# Patient Record
Sex: Female | Born: 1965 | Race: White | Hispanic: No | Marital: Married | Smoking: Former smoker
Health system: Southern US, Community
[De-identification: ages and names within clinical notes are randomized; demographics above are authoritative.]

## PROBLEM LIST (undated history)

## (undated) DIAGNOSIS — M359 Systemic involvement of connective tissue, unspecified: Secondary | ICD-10-CM

## (undated) DIAGNOSIS — T7840XA Allergy, unspecified, initial encounter: Secondary | ICD-10-CM

## (undated) DIAGNOSIS — E079 Disorder of thyroid, unspecified: Secondary | ICD-10-CM

## (undated) DIAGNOSIS — IMO0002 Reserved for concepts with insufficient information to code with codable children: Secondary | ICD-10-CM

## (undated) DIAGNOSIS — M329 Systemic lupus erythematosus, unspecified: Secondary | ICD-10-CM

## (undated) DIAGNOSIS — G4730 Sleep apnea, unspecified: Secondary | ICD-10-CM

## (undated) DIAGNOSIS — K219 Gastro-esophageal reflux disease without esophagitis: Secondary | ICD-10-CM

## (undated) DIAGNOSIS — F419 Anxiety disorder, unspecified: Secondary | ICD-10-CM

## (undated) DIAGNOSIS — M1990 Unspecified osteoarthritis, unspecified site: Secondary | ICD-10-CM

## (undated) HISTORY — DX: Disorder of thyroid, unspecified: E07.9

## (undated) HISTORY — PX: ABDOMINAL HYSTERECTOMY: SHX81

## (undated) HISTORY — DX: Anxiety disorder, unspecified: F41.9

## (undated) HISTORY — DX: Gastro-esophageal reflux disease without esophagitis: K21.9

## (undated) HISTORY — DX: Sleep apnea, unspecified: G47.30

## (undated) HISTORY — DX: Unspecified osteoarthritis, unspecified site: M19.90

## (undated) HISTORY — DX: Allergy, unspecified, initial encounter: T78.40XA

## (undated) HISTORY — PX: CHOLECYSTECTOMY: SHX55

## (undated) HISTORY — PX: COSMETIC SURGERY: SHX468

## (undated) HISTORY — PX: THYROIDECTOMY: SHX17

---

## 2018-07-18 ENCOUNTER — Encounter: Payer: Self-pay | Admitting: Emergency Medicine

## 2018-07-18 ENCOUNTER — Emergency Department
Admission: EM | Admit: 2018-07-18 | Discharge: 2018-07-18 | Disposition: A | Discharge status: Home / Self Care | Payer: Managed Care, Other (non HMO) | Attending: Emergency Medicine | Admitting: Emergency Medicine

## 2018-07-18 ENCOUNTER — Emergency Department: Payer: Managed Care, Other (non HMO)

## 2018-07-18 ENCOUNTER — Other Ambulatory Visit: Payer: Self-pay

## 2018-07-18 DIAGNOSIS — R1032 Left lower quadrant pain: Secondary | ICD-10-CM | POA: Diagnosis not present

## 2018-07-18 DIAGNOSIS — K5792 Diverticulitis of intestine, part unspecified, without perforation or abscess without bleeding: Secondary | ICD-10-CM

## 2018-07-18 HISTORY — DX: Reserved for concepts with insufficient information to code with codable children: IMO0002

## 2018-07-18 HISTORY — DX: Systemic involvement of connective tissue, unspecified: M35.9

## 2018-07-18 HISTORY — DX: Systemic lupus erythematosus, unspecified: M32.9

## 2018-07-18 LAB — URINALYSIS, COMPLETE (UACMP) WITH MICROSCOPIC
Bilirubin Urine: NEGATIVE
GLUCOSE, UA: NEGATIVE mg/dL
HGB URINE DIPSTICK: NEGATIVE
KETONES UR: NEGATIVE mg/dL
LEUKOCYTES UA: NEGATIVE
Nitrite: NEGATIVE
PH: 6 (ref 5.0–8.0)
PROTEIN: NEGATIVE mg/dL
Specific Gravity, Urine: 1.008 (ref 1.005–1.030)

## 2018-07-18 LAB — BASIC METABOLIC PANEL
Anion gap: 7 (ref 5–15)
BUN: 12 mg/dL (ref 6–20)
CALCIUM: 8.7 mg/dL — AB (ref 8.9–10.3)
CO2: 23 mmol/L (ref 22–32)
CREATININE: 0.51 mg/dL (ref 0.44–1.00)
Chloride: 109 mmol/L (ref 98–111)
GFR calc Af Amer: >60 mL/min (ref >60)
GLUCOSE: 111 mg/dL — AB (ref 70–99)
Potassium: 4.3 mmol/L (ref 3.5–5.1)
SODIUM: 139 mmol/L (ref 135–145)

## 2018-07-18 LAB — CBC WITH DIFFERENTIAL/PLATELET
Basophils Absolute: 0.1 10*3/uL (ref 0–0.1)
Basophils Relative: 1 %
EOS ABS: 0.2 10*3/uL (ref 0–0.7)
Eosinophils Relative: 2 %
HCT: 39.2 % (ref 35.0–47.0)
Hemoglobin: 13.6 g/dL (ref 12.0–16.0)
LYMPHS ABS: 1.4 10*3/uL (ref 1.0–3.6)
LYMPHS PCT: 16 %
MCH: 28.2 pg (ref 26.0–34.0)
MCHC: 34.7 g/dL (ref 32.0–36.0)
MCV: 81.3 fL (ref 80.0–100.0)
MONO ABS: 0.5 10*3/uL (ref 0.2–0.9)
Monocytes Relative: 6 %
Neutro Abs: 6.4 10*3/uL (ref 1.4–6.5)
Neutrophils Relative %: 75 %
PLATELETS: 292 10*3/uL (ref 150–440)
RBC: 4.82 MIL/uL (ref 3.80–5.20)
RDW: 14.5 % (ref 11.5–14.5)
WBC: 8.5 10*3/uL (ref 3.6–11.0)

## 2018-07-18 IMAGING — CT CT ABD-PELV W/ CM
2 of 5 series · 16 of 46 positions shown, 18 images · IV contrast (APPLIED)
Comparison: None.

CLINICAL DATA: Left lower abdominal pain

EXAM:
CT ABDOMEN AND PELVIS WITH CONTRAST
TECHNIQUE: Multidetector CT imaging of the abdomen and pelvis was performed
using the standard protocol following bolus administration of
intravenous contrast.
CONTRAST:  100mL [E5] IOPAMIDOL ([E5]) INJECTION 61%

[Series 2: routine abd/pel with · axial · 0.90mm/px · z∈[-997,-537]mm · 13 of 104 slices shown, 15 images]
[im 6/104  soft-tissue]
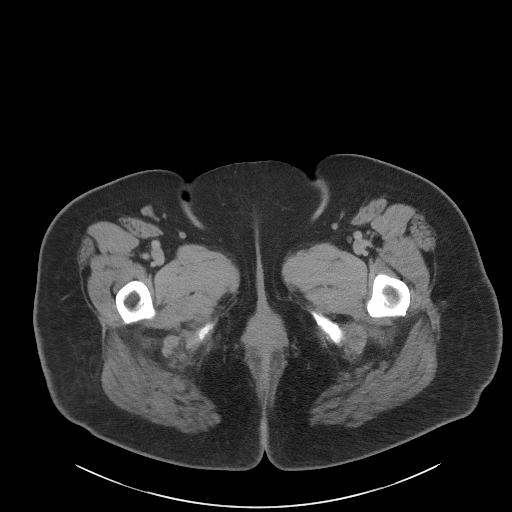
[im 6/104  bone]
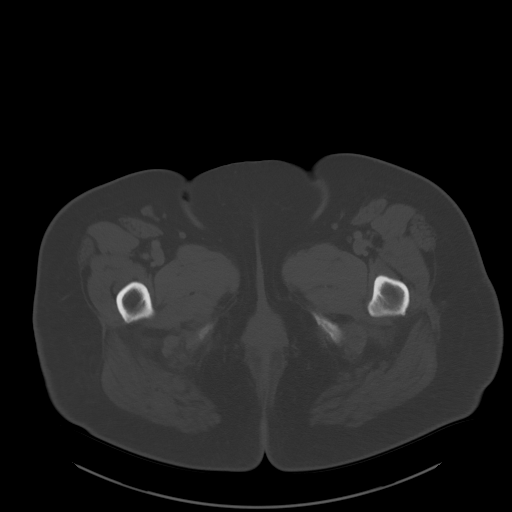
[im 12/104  soft-tissue]
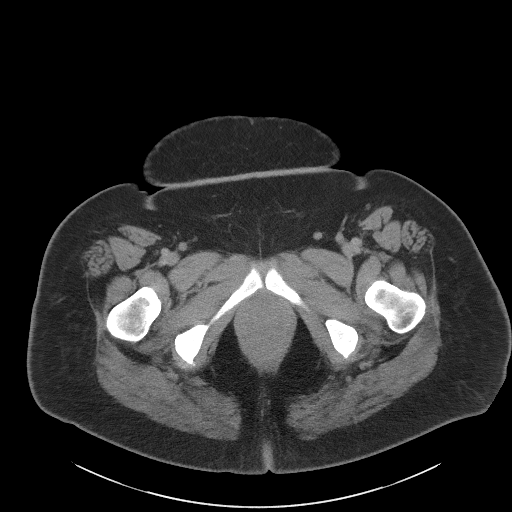
[im 23/104  soft-tissue]
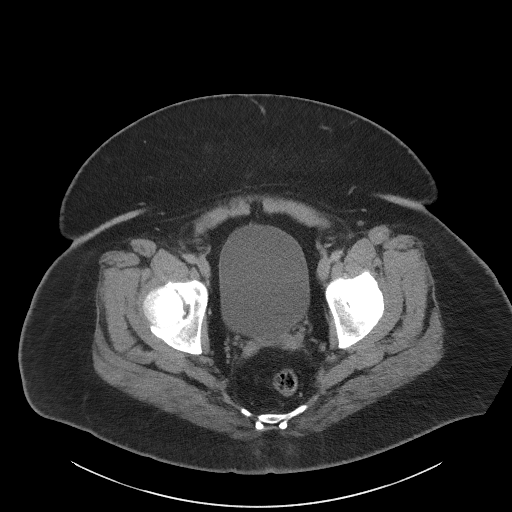
[im 29/104  soft-tissue]
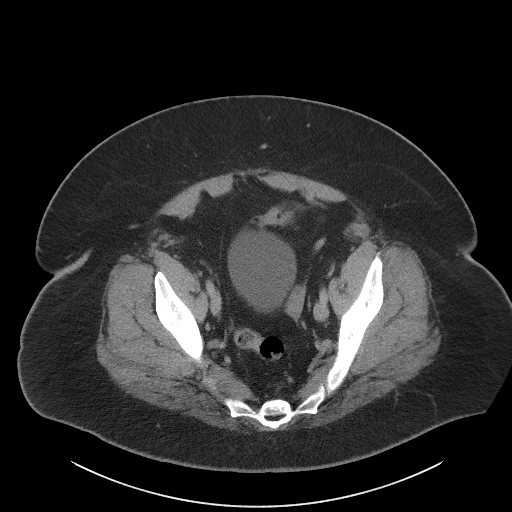
[im 35/104  soft-tissue]
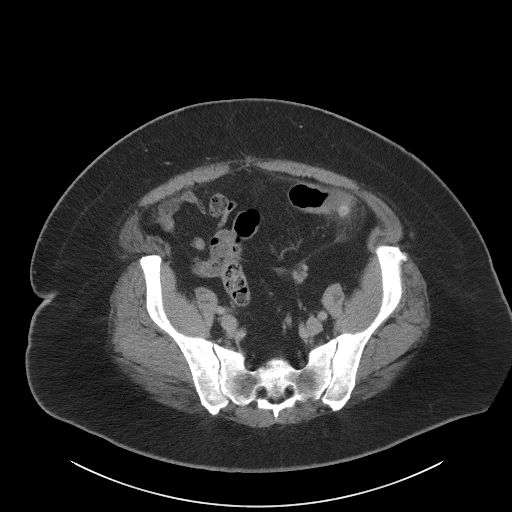
[im 46/104  soft-tissue]
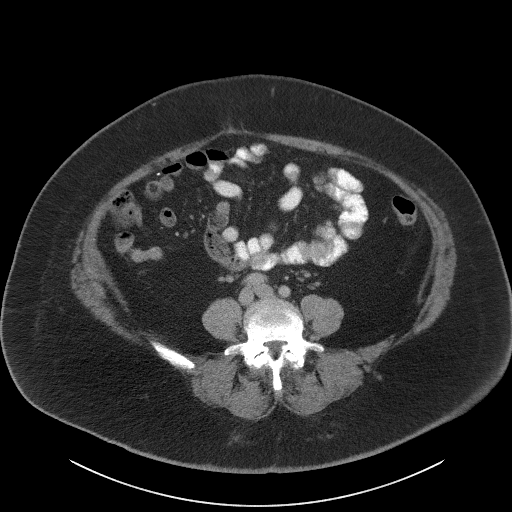
[im 52/104  soft-tissue]
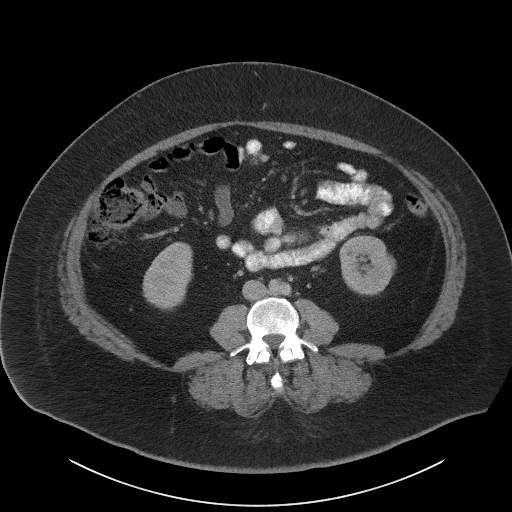
[im 58/104  soft-tissue]
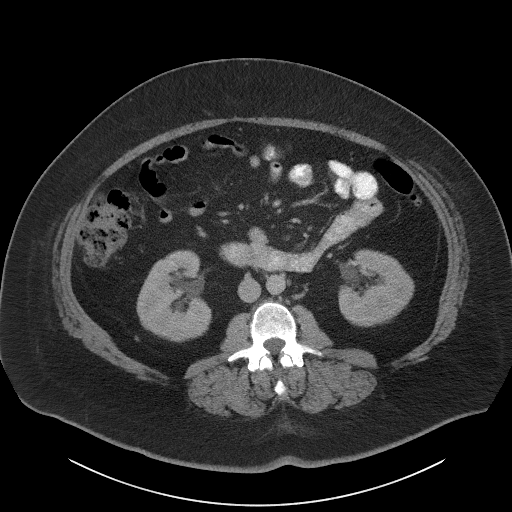
[im 69/104  soft-tissue]
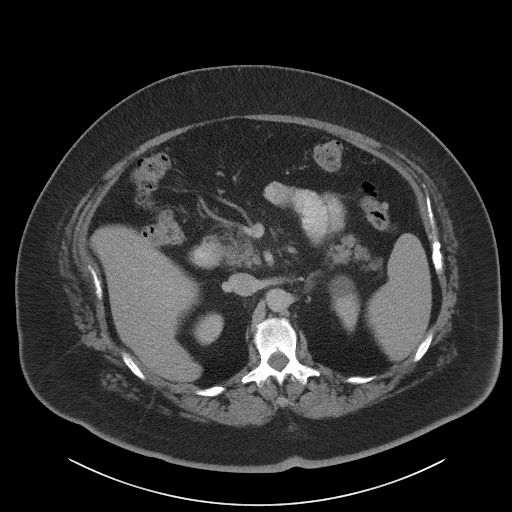
[im 69/104  bone]
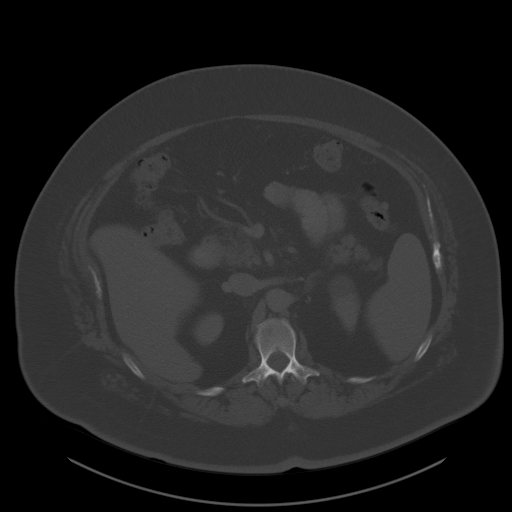
[im 75/104  soft-tissue]
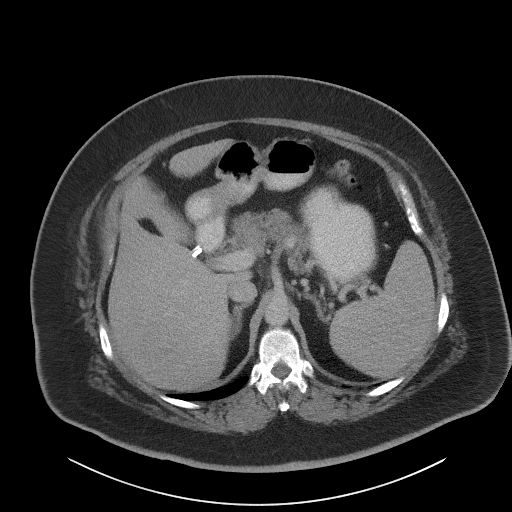
[im 81/104  soft-tissue]
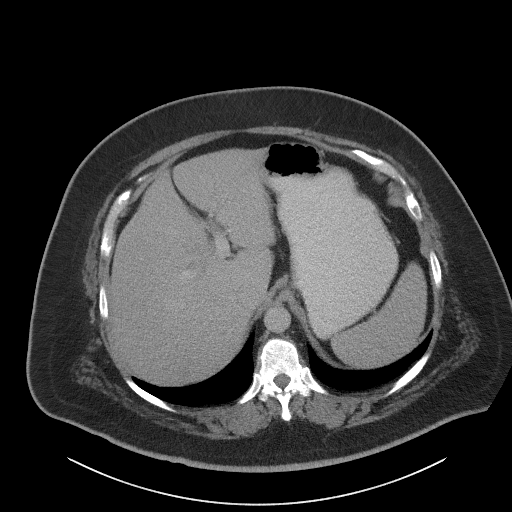
[im 92/104  soft-tissue]
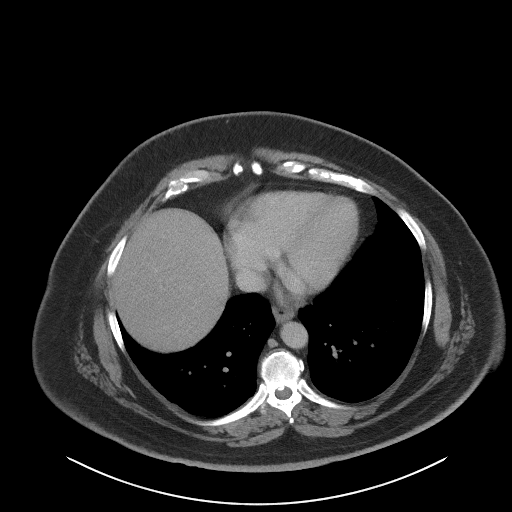
[im 98/104  soft-tissue]
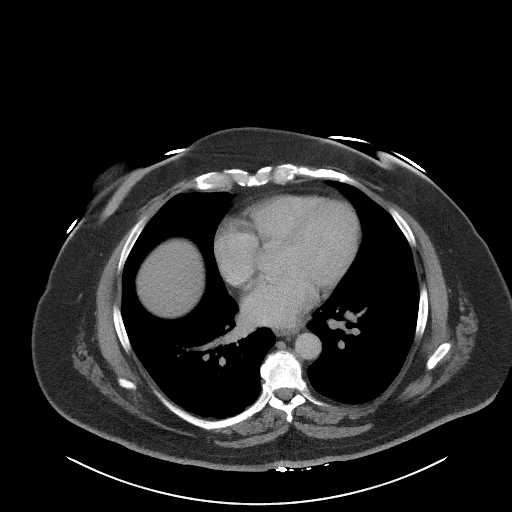

[Series 5: coronal st · coronal · 0.94mm/px · 3 of 125 slices shown]
[im 42/125  soft-tissue]
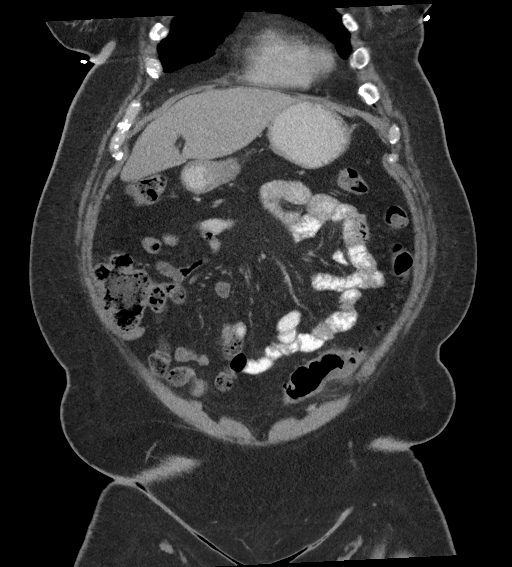
[im 56/125  soft-tissue]
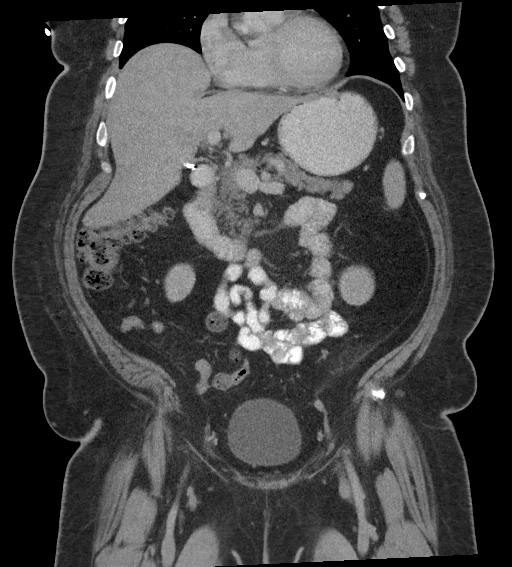
[im 69/125  soft-tissue]
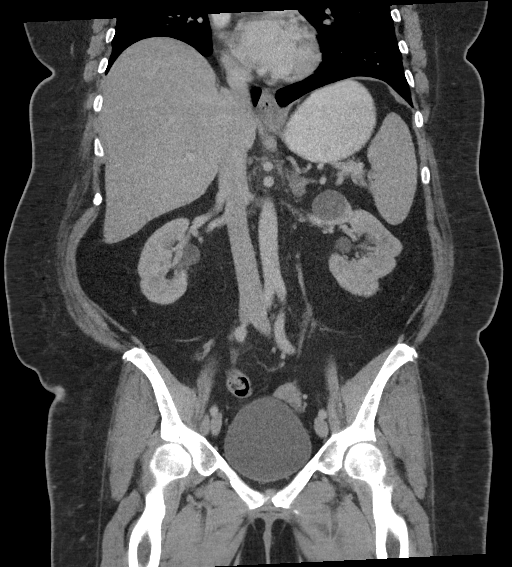

[16 of 46 positions shown; findings below may reference images not displayed]

FINDINGS: Lower chest: No acute abnormality.

Hepatobiliary: No focal liver abnormality is seen. Status post
cholecystectomy. No biliary dilatation.

Pancreas: Unremarkable. No pancreatic ductal dilatation or
surrounding inflammatory changes.

Spleen: Within normal limits.

Adrenals/Urinary Tract: Adrenal glands are within normal limits with
the exception of a small 14 mm hypodense lesion arising from the
left adrenal gland likely representing a small adenoma. Kidneys are
well visualized bilaterally without evidence of renal calculi or
urinary tract obstructive changes. Scattered cysts are noted within
the left kidney the largest of which is noted superiorly measuring
3.4 cm. The bladder is partially distended.

Stomach/Bowel: There are changes consistent with diverticulitis
involving the junction of the descending and sigmoid colon. No
perforation or abscess formation is identified. The remainder of the
colon is within normal limits. The appendix is unremarkable. No
small bowel abnormality is seen. Small sliding-type hiatal hernia is
noted.

Vascular/Lymphatic: No significant vascular findings are present. No
enlarged abdominal or pelvic lymph nodes.

Reproductive: Status post hysterectomy. No adnexal masses.

Other: No abdominal wall hernia or abnormality. No abdominopelvic
ascites.

Musculoskeletal: Mild degenerative changes of the lumbar spine are
noted.
IMPRESSION: Changes consistent with diverticulitis at the junction of the
descending and sigmoid colons without evidence of perforation or
abscess formation.

14 mm hypodense adrenal lesion. Given its size this is likely
benign. Follow-up adrenal CT can be considered in 12 months for
reassessment. This recommendation follows ACR consensus guidelines:
Management of Incidental Adrenal Masses: A White Paper of the ACR
Incidental Findings Committee. [HOSPITAL] [E5];14:[PHONE_NUMBER].

## 2018-07-18 MED ORDER — AMOXICILLIN-POT CLAVULANATE 875-125 MG PO TABS: 1.0000 | ORAL_TABLET | Freq: Two times a day (BID) | ORAL | 0 refills | Status: DC

## 2018-07-18 MED ORDER — IOPAMIDOL (ISOVUE-300) INJECTION 61%
30.0000 mL | Freq: Once | INTRAVENOUS | Status: AC | PRN
  Administered 2018-07-18: 30 mL via ORAL

## 2018-07-18 MED ORDER — ONDANSETRON 4 MG PO TBDP: 4.0000 mg | ORAL_TABLET | Freq: Three times a day (TID) | ORAL | 0 refills | Status: DC | PRN

## 2018-07-18 MED ORDER — SODIUM CHLORIDE 0.9 % IV BOLUS
1000.0000 mL | Freq: Once | INTRAVENOUS | Status: AC
  Administered 2018-07-18: 1000 mL via INTRAVENOUS

## 2018-07-18 MED ORDER — PIPERACILLIN-TAZOBACTAM 3.375 G IVPB 30 MIN
3.3750 g | Freq: Once | INTRAVENOUS | Status: AC
  Administered 2018-07-18: 3.375 g via INTRAVENOUS
  Filled 2018-07-18: qty 50

## 2018-07-18 MED ORDER — FENTANYL CITRATE (PF) 100 MCG/2ML IJ SOLN
50.0000 ug | Freq: Once | INTRAMUSCULAR | Status: AC
  Administered 2018-07-18: 50 ug via INTRAVENOUS
  Filled 2018-07-18: qty 2

## 2018-07-18 MED ORDER — IOPAMIDOL (ISOVUE-300) INJECTION 61%
100.0000 mL | Freq: Once | INTRAVENOUS | Status: AC | PRN
  Administered 2018-07-18: 100 mL via INTRAVENOUS

## 2018-07-18 MED ORDER — OXYCODONE-ACETAMINOPHEN 5-325 MG PO TABS
1.0000 | ORAL_TABLET | Freq: Once | ORAL | Status: AC
  Administered 2018-07-18: 1 via ORAL
  Filled 2018-07-18: qty 1

## 2018-07-18 MED ORDER — OXYCODONE-ACETAMINOPHEN 5-325 MG PO TABS: 1.0000 | ORAL_TABLET | ORAL | 0 refills | Status: DC | PRN

## 2018-07-18 MED ORDER — ONDANSETRON HCL 4 MG/2ML IJ SOLN
4.0000 mg | Freq: Once | INTRAMUSCULAR | Status: AC
  Administered 2018-07-18: 4 mg via INTRAVENOUS
  Filled 2018-07-18: qty 2

## 2018-07-18 NOTE — ED Triage Notes (Signed)
Patient ambulatory to triage with steady gait, without difficulty or distress noted; pt reports awoke this morning with left lower abd pain with no accomp symptoms; denies hx of same

## 2018-07-18 NOTE — ED Notes (Signed)
Patient transported to CT at this time. 

## 2018-07-18 NOTE — Discharge Instructions (Signed)
1.  Take antibiotic as prescribed (Augmentin 875 mg twice daily x7 days). 2.  You may take medicines as needed for pain and nausea (Percocet/Zofran #20). 3.  Return to the ER for worsening symptoms, persistent vomiting, fever or other concerns.

## 2018-07-18 NOTE — ED Provider Notes (Signed)
[facility] Emergency Department Provider Note   ____________________________________________   First MD Initiated Contact with Patient 07/18/18 0518     (approximate)  I have reviewed the triage vital signs and the nursing notes.   HISTORY  Chief Complaint Abdominal Pain    HPI Ruth Woods is a 52 y.o. female who presents to the ED from home with a chief complaint of abdominal pain.  Patient went to bed in her usual state of good health and awoke with left lower quadrant pain.  Describes pain as aching and nonradiating.  Not associated with fever, chills, chest pain, shortness of breath, nausea, vomiting, dysuria.  Last bowel movement yesterday which was normal for her.  Colonoscopy last year which demonstrated diverticulosis.  History of hysterectomy with one ovary removed (patient does not recall which one).  No personal history of kidney stones.  Denies recent trauma or travel.   Past Medical History:  Diagnosis Date  . Collagen vascular disease (HCC)   . Lupus (HCC)     There are no active problems to display for this patient.   Past Surgical History:  Procedure Laterality Date  . ABDOMINAL HYSTERECTOMY    . CHOLECYSTECTOMY      Prior to Admission medications   Medication Sig Start Date End Date Taking? Authorizing Provider  amoxicillin-clavulanate (AUGMENTIN) 875-125 MG tablet Take 1 tablet by mouth 2 (two) times daily. 07/18/18   Irean Hong, MD  ondansetron (ZOFRAN ODT) 4 MG disintegrating tablet Take 1 tablet (4 mg total) by mouth every 8 (eight) hours as needed for nausea or vomiting. 07/18/18   Irean Hong, MD  oxyCODONE-acetaminophen (PERCOCET/ROXICET) 5-325 MG tablet Take 1 tablet by mouth every 4 (four) hours as needed for severe pain. 07/18/18   Irean Hong, MD    Allergies Levaquin [levofloxacin in d5w] and Penicillins  No family history on file.  Social History Social History   Tobacco Use  . Smoking status: Not on  file  Substance Use Topics  . Alcohol use: Not on file  . Drug use: Not on file    Review of Systems  Constitutional: No fever/chills Eyes: No visual changes. ENT: No sore throat. Cardiovascular: Denies chest pain. Respiratory: Denies shortness of breath. Gastrointestinal: Positive for abdominal pain.  No nausea, no vomiting.  No diarrhea.  No constipation. Genitourinary: Negative for dysuria. Musculoskeletal: Negative for back pain. Skin: Negative for rash. Neurological: Negative for headaches, focal weakness or numbness.   ____________________________________________   PHYSICAL EXAM:  VITAL SIGNS: ED Triage Vitals  Enc Vitals Group     BP 07/18/18 0516 138/83     Pulse Rate 07/18/18 0516 (!) 106     Resp 07/18/18 0516 20     Temp 07/18/18 0516 98.2 F (36.8 C)     Temp Source 07/18/18 0516 Oral     SpO2 07/18/18 0516 98 %     Weight 07/18/18 0510 262 lb (118.8 kg)     Height 07/18/18 0510 5\' 6"  (1.676 m)     Head Circumference --      Peak Flow --      Pain Score 07/18/18 0510 5     Pain Loc --      Pain Edu? --      Excl. in GC? --     Constitutional: Alert and oriented. Well appearing and in mild acute distress. Eyes: Conjunctivae are normal. PERRL. EOMI. Head: Atraumatic. Nose: No congestion/rhinnorhea. Mouth/Throat: Mucous membranes are moist.  Oropharynx  non-erythematous. Neck: No stridor.   Cardiovascular: Normal rate, regular rhythm. Grossly normal heart sounds.  Good peripheral circulation. Respiratory: Normal respiratory effort.  No retractions. Lungs CTAB. Gastrointestinal: Soft and mildly tender to palpation left lower quadrant without rebound or guarding. No distention. No abdominal bruits. No CVA tenderness. Musculoskeletal: No lower extremity tenderness nor edema.  No joint effusions. Neurologic:  Normal speech and language. No gross focal neurologic deficits are appreciated. No gait instability. Skin:  Skin is warm, dry and intact. No rash  noted. Psychiatric: Mood and affect are normal. Speech and behavior are normal.  ____________________________________________   LABS (all labs ordered are listed, but only abnormal results are displayed)  Labs Reviewed  BASIC METABOLIC PANEL - Abnormal; Notable for the following components:      Result Value   Glucose, Bld 111 (*)    Calcium 8.7 (*)    All other components within normal limits  CBC WITH DIFFERENTIAL/PLATELET  URINALYSIS, COMPLETE (UACMP) WITH MICROSCOPIC   ____________________________________________  EKG  None ____________________________________________  RADIOLOGY  ED MD interpretation: Diverticulitis; likely benign adrenal lesion  Official radiology report(s): Ct Abdomen Pelvis W Contrast  Result Date: 07/18/2018 CLINICAL DATA:  Left lower abdominal pain EXAM: CT ABDOMEN AND PELVIS WITH CONTRAST TECHNIQUE: Multidetector CT imaging of the abdomen and pelvis was performed using the standard protocol following bolus administration of intravenous contrast. CONTRAST:  ISOVUE-300 IOPAMIDOL (ISOVUE-300) INJECTION 61% COMPARISON:  None. FINDINGS: Lower chest: No acute abnormality. Hepatobiliary: No focal liver abnormality is seen. Status post cholecystectomy. No biliary dilatation. Pancreas: Unremarkable. No pancreatic ductal dilatation or surrounding inflammatory changes. Spleen: Within normal limits. Adrenals/Urinary Tract: Adrenal glands are within normal limits with the exception of a small 14 mm hypodense lesion arising from the left adrenal gland likely representing a small adenoma. Kidneys are well visualized bilaterally without evidence of renal calculi or urinary tract obstructive changes. Scattered cysts are noted within the left kidney the largest of which is noted superiorly measuring 3.4 cm. The bladder is partially distended. Stomach/Bowel: There are changes consistent with diverticulitis involving the junction of the descending and sigmoid colon. No  perforation or abscess formation is identified. The remainder of the colon is within normal limits. The appendix is unremarkable. No small bowel abnormality is seen. Small sliding-type hiatal hernia is noted. Vascular/Lymphatic: No significant vascular findings are present. No enlarged abdominal or pelvic lymph nodes. Reproductive: Status post hysterectomy. No adnexal masses. Other: No abdominal wall hernia or abnormality. No abdominopelvic ascites. Musculoskeletal: Mild degenerative changes of the lumbar spine are noted. IMPRESSION: Changes consistent with diverticulitis at the junction of the descending and sigmoid colons without evidence of perforation or abscess formation. 14 mm hypodense adrenal lesion. Given its size this is likely benign. Follow-up adrenal CT can be considered in 12 months for reassessment. This recommendation follows ACR consensus guidelines: Management of Incidental Adrenal Masses: A White Paper of the ACR Incidental Findings Committee. J Am Coll Radiol 2017;14:1038-1044. Electronically Signed   By: Alcide Clever M.D.   On: 07/18/2018 07:10    ____________________________________________   PROCEDURES  Procedure(s) performed: None  Procedures  Critical Care performed: No  ____________________________________________   INITIAL IMPRESSION / ASSESSMENT AND PLAN / ED COURSE  As part of my medical decision making, I reviewed the following data within the electronic MEDICAL RECORD NUMBER History obtained from family, Nursing notes reviewed and incorporated, Labs reviewed and Notes from prior ED visits   52 year old female who presents with left lower quadrant abdominal pain. Differential diagnosis  includes, but is not limited to, ovarian cyst, ovarian torsion, acute appendicitis, diverticulitis, urinary tract infection/pyelonephritis, endometriosis, bowel obstruction, colitis, renal colic, gastroenteritis, hernia, fibroids, endometriosis, pregnancy related pain including ectopic  pregnancy, etc.  Will obtain screening lab work, urinalysis.  Proceed with CT abdomen/pelvis to evaluate etiology of patient's pain.  Will administer 50 mcg IV fentanyl for pain paired with 4 mg IV Zofran for nausea.  Clinical Course as of Jul 18 740  Fri Jul 18, 2018  0718 Updated patient and spouse of CT imaging results.  Will treat with Augmentin and patient will follow-up closely with her PCP.  Strict return precautions given.  Both verbalize understanding and agree with plan of care.   [JS]    Clinical Course User Index [JS] Irean Hong, MD     ____________________________________________   FINAL CLINICAL IMPRESSION(S) / ED DIAGNOSES  Final diagnoses:  Left lower quadrant pain  Diverticulitis     ED Discharge Orders         Ordered    amoxicillin-clavulanate (AUGMENTIN) 875-125 MG tablet  2 times daily     07/18/18 0730    oxyCODONE-acetaminophen (PERCOCET/ROXICET) 5-325 MG tablet  Every 4 hours PRN     07/18/18 0730    ondansetron (ZOFRAN ODT) 4 MG disintegrating tablet  Every 8 hours PRN     07/18/18 0730           Note:  This document was prepared using Dragon voice recognition software and may include unintentional dictation errors.    Irean Hong, MD 07/18/18 [phone]

## 2018-07-18 NOTE — ED Notes (Signed)
[facility] on [address] called requesting antibiotics be changed from Amoxicillin due to patient having allergy to penicillin. Chart was reviewed by Dr. Don Perking and Flagyl 500 mg BID x 7 days and Bactrim DS BID x 7 days was called into pharmacy by Larina Earthly.

## 2018-10-08 DIAGNOSIS — G5600 Carpal tunnel syndrome, unspecified upper limb: Secondary | ICD-10-CM

## 2018-10-08 HISTORY — DX: Carpal tunnel syndrome, unspecified upper limb: G56.00

## 2019-02-03 ENCOUNTER — Emergency Department
Admission: EM | Admit: 2019-02-03 | Discharge: 2019-02-03 | Disposition: A | Discharge status: Home / Self Care | Payer: Managed Care, Other (non HMO) | Attending: Emergency Medicine | Admitting: Emergency Medicine

## 2019-02-03 ENCOUNTER — Other Ambulatory Visit: Payer: Self-pay

## 2019-02-03 ENCOUNTER — Encounter: Payer: Self-pay | Admitting: *Deleted

## 2019-02-03 DIAGNOSIS — L309 Dermatitis, unspecified: Secondary | ICD-10-CM

## 2019-02-03 DIAGNOSIS — L259 Unspecified contact dermatitis, unspecified cause: Secondary | ICD-10-CM | POA: Diagnosis not present

## 2019-02-03 DIAGNOSIS — R21 Rash and other nonspecific skin eruption: Secondary | ICD-10-CM | POA: Diagnosis present

## 2019-02-03 DIAGNOSIS — M3210 Systemic lupus erythematosus, organ or system involvement unspecified: Secondary | ICD-10-CM | POA: Insufficient documentation

## 2019-02-03 MED ORDER — CEPHALEXIN 500 MG PO CAPS: 500.0000 mg | ORAL_CAPSULE | Freq: Three times a day (TID) | ORAL | 0 refills | Status: DC

## 2019-02-03 MED ORDER — SULFAMETHOXAZOLE-TRIMETHOPRIM 800-160 MG PO TABS: 1.0000 | ORAL_TABLET | Freq: Two times a day (BID) | ORAL | 0 refills | Status: DC

## 2019-02-03 MED ORDER — PREDNISONE 10 MG (21) PO TBPK: ORAL_TABLET | ORAL | 0 refills | Status: DC

## 2019-02-03 MED ORDER — PERMETHRIN 5 % EX CREA: TOPICAL_CREAM | CUTANEOUS | 0 refills | Status: DC

## 2019-02-03 NOTE — ED Provider Notes (Signed)
[facility] Emergency Department Provider Note  ____________________________________________  Time seen: Approximately 6:45 AM  I have reviewed the triage vital signs and the nursing notes.   HISTORY  Chief Complaint Rash    HPI Ruth Woods is a 53 y.o. female with a history of lupus who complains of a diffuse pruritic rash, worsening for the past week.  She reports that it all started after trying to do some gardening.  There was no foliage but she is concerned that she came into contact with poison ivy vines.  She reports that the rash is intensely pruritic and denies fevers chills or sweats.  She also has an enlarged area of rash in the flexor surface of the right elbow.  She has been applying alcohol as well as calamine lotion daily to her rashes but it seems to be getting worse.   Nobody else at home with rashes currently.     Past Medical History:  Diagnosis Date  . Collagen vascular disease (HCC)   . Lupus (HCC)      There are no active problems to display for this patient.    Past Surgical History:  Procedure Laterality Date  . ABDOMINAL HYSTERECTOMY    . CHOLECYSTECTOMY       Prior to Admission medications   Medication Sig Start Date End Date Taking? Authorizing Provider  amoxicillin-clavulanate (AUGMENTIN) 875-125 MG tablet Take 1 tablet by mouth 2 (two) times daily. 07/18/18   Irean Hong, MD  cephALEXin (KEFLEX) 500 MG capsule Take 1 capsule (500 mg total) by mouth 3 (three) times daily. 02/03/19   Sharman Cheek, MD  ondansetron (ZOFRAN ODT) 4 MG disintegrating tablet Take 1 tablet (4 mg total) by mouth every 8 (eight) hours as needed for nausea or vomiting. 07/18/18   Irean Hong, MD  oxyCODONE-acetaminophen (PERCOCET/ROXICET) 5-325 MG tablet Take 1 tablet by mouth every 4 (four) hours as needed for severe pain. 07/18/18   Irean Hong, MD  permethrin (ELIMITE) 5 % cream Thoroughly massage cream (using about half the tube) from  head to soles of feet; leave on for 8 to 14 hours before removing (shower or bath); 02/03/19   Sharman Cheek, MD  predniSONE (STERAPRED UNI-PAK 21 TAB) 10 MG (21) TBPK tablet 6 tablets on day 1, then 5 tablets on day 2, then 4 tablets on day 3, then 3 tablets on day 4, then 2 tablets on day 5, then 1 tablet on day 6. 02/03/19   Sharman Cheek, MD  sulfamethoxazole-trimethoprim (BACTRIM DS) 800-160 MG tablet Take 1 tablet by mouth 2 (two) times daily. 02/03/19   Sharman Cheek, MD     Allergies Levaquin [levofloxacin in d5w] and Penicillins   No family history on file.  Social History Social History   Tobacco Use  . Smoking status: Never Smoker  . Smokeless tobacco: Never Used  Substance Use Topics  . Alcohol use: Not Currently  . Drug use: Not Currently    Review of Systems  Constitutional:   No fever or chills.  Cardiovascular:   No chest pain or syncope. Respiratory:   No dyspnea or cough. Gastrointestinal:   Negative for abdominal pain, vomiting and diarrhea.  Musculoskeletal:   Negative for focal pain or swelling All other systems reviewed and are negative except as documented above in ROS and HPI.  ____________________________________________   PHYSICAL EXAM:  VITAL SIGNS: ED Triage Vitals  Enc Vitals Group     BP 02/03/19 0203 132/73  Pulse Rate 02/03/19 0203 88     Resp 02/03/19 0203 18     Temp 02/03/19 0203 97.8 F (36.6 C)     Temp Source 02/03/19 0203 Oral     SpO2 02/03/19 0203 97 %     Weight 02/03/19 0147 270 lb (122.5 kg)     Height 02/03/19 0147  (1.702 m)     Head Circumference --      Peak Flow --      Pain Score 02/03/19 0147 8     Pain Loc --      Pain Edu? --      Excl. in GC? --     Vital signs reviewed, nursing assessments reviewed.   Constitutional:   Alert and oriented. Non-toxic appearance. Eyes:   Conjunctivae are normal. EOMI. ENT      Head:   Normocephalic and atraumatic.      Mouth/Throat:   MMM.  No  oral lesions      Neck:   No meningismus. Full ROM. Hematological/Lymphatic/Immunilogical:   No cervical lymphadenopathy. Cardiovascular:   RRR.  Cap refill less than 2 seconds. Respiratory:   Normal respiratory effort without tachypnea/retractions.  Gastrointestinal:   Soft and nontender. Non distended.   No rebound, rigidity, or guarding. Musculoskeletal:   Normal range of motion in all extremities.  No edema. Neurologic:   Normal speech and language.  Motor grossly intact. No acute focal neurologic deficits are appreciated.  Skin:    Skin is warm, dry and intact.  Diffuse papular rash all over the entire body, except for neck face and lower legs.  Arranged in linear distributions consistent with scabies.  There is a 4 cm confluent erythematous area on the flexor surface of the right elbow, with dry raised edges, nontender not hot.  This has evolved over the past week according to the patient.  No petechia purpura or bullae  LABS (pertinent positives/negatives) (all labs ordered are listed, but only abnormal results are displayed) Labs Reviewed - No data to display ____________________________________________   EKG  ____________________________________________    RADIOLOGY  No results found.  ____________________________________________   PROCEDURES Procedures  ____________________________________________  CLINICAL IMPRESSION / ASSESSMENT AND PLAN / ED COURSE  Pertinent labs & imaging results that were available during my care of the patient were reviewed by me and considered in my medical decision making (see chart for details).    Patient presents with generalized rash, most consistent with scabies.  However the larger area on her right elbow may be cellulitis with atypical appearance due to her frequent application of alcohol and calamine.  There may be a contribution of Toxicodendron dermatitis as well.  Patient is resistant to the idea that this might be scabies and  is pretty sure that it is poison ivy.    We will do a short-term prednisone taper, Keflex and Bactrim for the elbow, and Elimite if she is not improving over a few days.      ____________________________________________   FINAL CLINICAL IMPRESSION(S) / ED DIAGNOSES    Final diagnoses:  Dermatitis     ED Discharge Orders         Ordered    predniSONE (STERAPRED UNI-PAK 21 TAB) 10 MG (21) TBPK tablet     02/03/19 0642    cephALEXin (KEFLEX) 500 MG capsule  3 times daily     02/03/19 0642    sulfamethoxazole-trimethoprim (BACTRIM DS) 800-160 MG tablet  2 times daily     02/03/19 [phone]  permethrin (ELIMITE) 5 % cream     02/03/19 0644          Portions of this note were generated with dragon dictation software. Dictation errors may occur despite best attempts at proofreading.   Sharman Cheek, MD 02/03/19 [phone]

## 2019-02-03 NOTE — ED Notes (Signed)
Pt asked to walk outside in order to talk to husband. Pt stated that she would be right back in order to get paperwork. Pt in NAD at this time.

## 2019-02-03 NOTE — ED Triage Notes (Signed)
Pt has itching rash on arms, legs and beneath breast.  Sx for 3 days.  Pt has itching.  Pt taking otc meds without relief.  Pt alert.

## 2019-05-28 DIAGNOSIS — Z79899 Other long term (current) drug therapy: Secondary | ICD-10-CM | POA: Insufficient documentation

## 2019-05-28 DIAGNOSIS — H04123 Dry eye syndrome of bilateral lacrimal glands: Secondary | ICD-10-CM | POA: Insufficient documentation

## 2019-05-28 DIAGNOSIS — E559 Vitamin D deficiency, unspecified: Secondary | ICD-10-CM | POA: Insufficient documentation

## 2019-05-28 DIAGNOSIS — E66813 Obesity, class 3: Secondary | ICD-10-CM | POA: Diagnosis present

## 2019-05-28 DIAGNOSIS — M2550 Pain in unspecified joint: Secondary | ICD-10-CM

## 2019-05-28 HISTORY — DX: Pain in unspecified joint: M25.50

## 2019-06-19 DIAGNOSIS — R21 Rash and other nonspecific skin eruption: Secondary | ICD-10-CM | POA: Insufficient documentation

## 2019-06-19 DIAGNOSIS — M159 Polyosteoarthritis, unspecified: Secondary | ICD-10-CM | POA: Diagnosis present

## 2022-04-02 ENCOUNTER — Emergency Department: Payer: Managed Care, Other (non HMO)

## 2022-04-02 ENCOUNTER — Other Ambulatory Visit: Payer: Self-pay

## 2022-04-02 ENCOUNTER — Inpatient Hospital Stay
Admission: EM | Admit: 2022-04-02 | Discharge: 2022-04-04 | DRG: 123 | Disposition: A | Discharge status: Home / Self Care | Payer: Managed Care, Other (non HMO) | Attending: Internal Medicine | Admitting: Internal Medicine

## 2022-04-02 ENCOUNTER — Encounter: Payer: Self-pay | Admitting: Emergency Medicine

## 2022-04-02 DIAGNOSIS — H469 Unspecified optic neuritis: Principal | ICD-10-CM | POA: Diagnosis present

## 2022-04-02 DIAGNOSIS — H538 Other visual disturbances: Secondary | ICD-10-CM | POA: Diagnosis not present

## 2022-04-02 DIAGNOSIS — M159 Polyosteoarthritis, unspecified: Secondary | ICD-10-CM | POA: Diagnosis present

## 2022-04-02 DIAGNOSIS — Z90710 Acquired absence of both cervix and uterus: Secondary | ICD-10-CM

## 2022-04-02 DIAGNOSIS — L930 Discoid lupus erythematosus: Secondary | ICD-10-CM | POA: Diagnosis present

## 2022-04-02 DIAGNOSIS — Z79899 Other long term (current) drug therapy: Secondary | ICD-10-CM

## 2022-04-02 DIAGNOSIS — R519 Headache, unspecified: Secondary | ICD-10-CM

## 2022-04-02 DIAGNOSIS — Z6841 Body Mass Index (BMI) 40.0 and over, adult: Secondary | ICD-10-CM

## 2022-04-02 LAB — CBC WITH DIFFERENTIAL/PLATELET
Abs Immature Granulocytes: 0.08 10*3/uL — ABNORMAL HIGH (ref 0.00–0.07)
Basophils Absolute: 0.1 10*3/uL (ref 0.0–0.1)
Basophils Relative: 1 %
Eosinophils Absolute: 0.1 10*3/uL (ref 0.0–0.5)
Eosinophils Relative: 1 %
HCT: 43.1 % (ref 36.0–46.0)
Hemoglobin: 13.5 g/dL (ref 12.0–15.0)
Immature Granulocytes: 1 %
Lymphocytes Relative: 12 %
Lymphs Abs: 1.2 10*3/uL (ref 0.7–4.0)
MCH: 26 pg (ref 26.0–34.0)
MCHC: 31.3 g/dL (ref 30.0–36.0)
MCV: 83 fL (ref 80.0–100.0)
Monocytes Absolute: 0.3 10*3/uL (ref 0.1–1.0)
Monocytes Relative: 3 %
Neutro Abs: 8.2 10*3/uL — ABNORMAL HIGH (ref 1.7–7.7)
Neutrophils Relative %: 82 %
Platelets: 302 10*3/uL (ref 150–400)
RBC: 5.19 MIL/uL — ABNORMAL HIGH (ref 3.87–5.11)
RDW: 14.5 % (ref 11.5–15.5)
WBC: 9.8 10*3/uL (ref 4.0–10.5)
nRBC: 0 % (ref 0.0–0.2)

## 2022-04-02 LAB — TROPONIN I (HIGH SENSITIVITY): Troponin I (High Sensitivity): 3 ng/L (ref <18)

## 2022-04-02 LAB — COMPREHENSIVE METABOLIC PANEL
ALT: 22 U/L (ref 0–44)
AST: 23 U/L (ref 15–41)
Albumin: 3.9 g/dL (ref 3.5–5.0)
Alkaline Phosphatase: 84 U/L (ref 38–126)
Anion gap: 10 (ref 5–15)
BUN: 16 mg/dL (ref 6–20)
CO2: 22 mmol/L (ref 22–32)
Calcium: 9.3 mg/dL (ref 8.9–10.3)
Chloride: 106 mmol/L (ref 98–111)
Creatinine, Ser: 0.68 mg/dL (ref 0.44–1.00)
GFR, Estimated: >60 mL/min (ref >60)
Glucose, Bld: 136 mg/dL — ABNORMAL HIGH (ref 70–99)
Potassium: 4.4 mmol/L (ref 3.5–5.1)
Sodium: 138 mmol/L (ref 135–145)
Total Bilirubin: 0.8 mg/dL (ref 0.3–1.2)
Total Protein: 7.3 g/dL (ref 6.5–8.1)

## 2022-04-02 IMAGING — MR MR HEAD W/O CM
12 series · 47 of 48 positions shown · non-contrast
Comparison: None Available.

CLINICAL DATA: Left eye blurriness, dizziness

EXAM:
MRI HEAD WITHOUT CONTRAST
TECHNIQUE: Multiplanar, multiecho pulse sequences of the brain and surrounding
structures were obtained without intravenous contrast.

[Series 5: ax dwi_tracew · axial · 3.0mm · 0.65mm/px · z∈[-114,+51]mm · 3 of 52 slices shown]
[im 1/52]
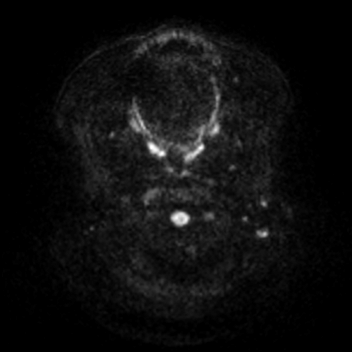
[im 26/52]
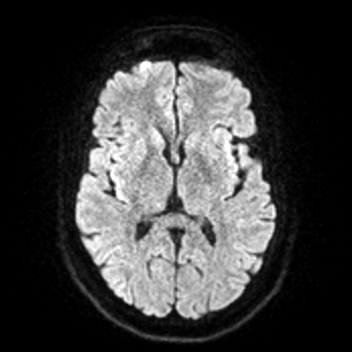
[im 52/52]
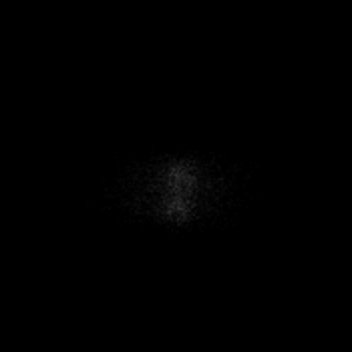

[Series 6: ax dwi_adc · axial · 3.0mm · 0.65mm/px · z∈[-114,+48]mm · 4 of 51 slices shown]
[im 1/51]
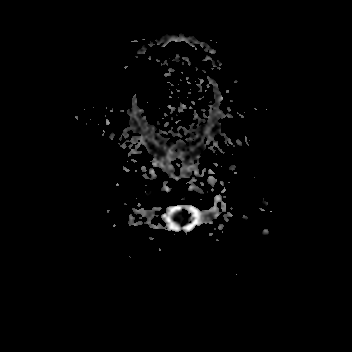
[im 17/51]
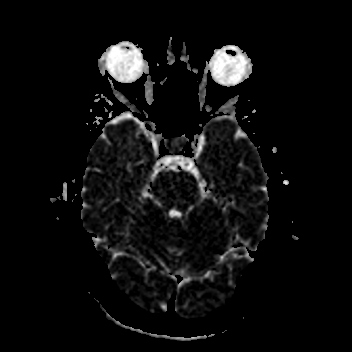
[im 34/51]
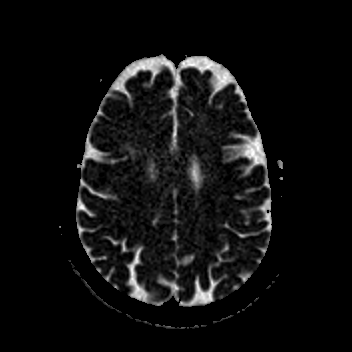
[im 51/51]
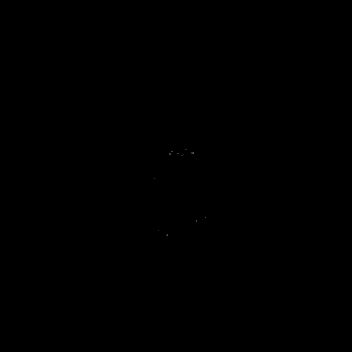

[Series 7: cor dwi_tracew · coronal · 5.0mm · 0.65mm/px · 3 of 40 slices shown]
[im 1/40]
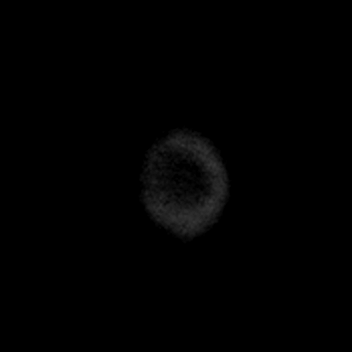
[im 20/40]
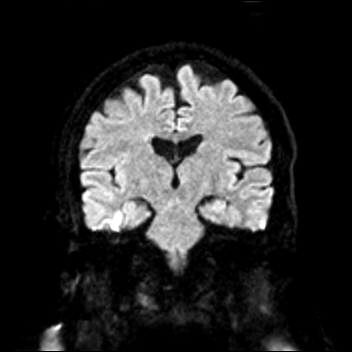
[im 40/40]
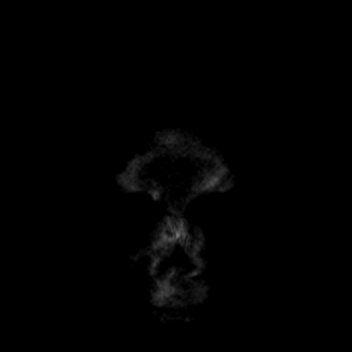

[Series 8: cor dwi_adc · coronal · 5.0mm · 0.65mm/px · 3 of 37 slices shown]
[im 1/37]
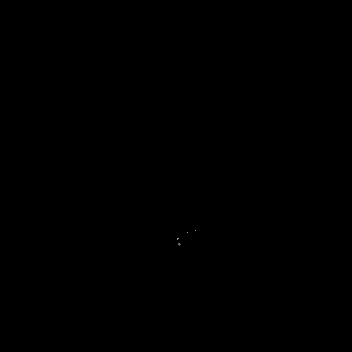
[im 19/37]
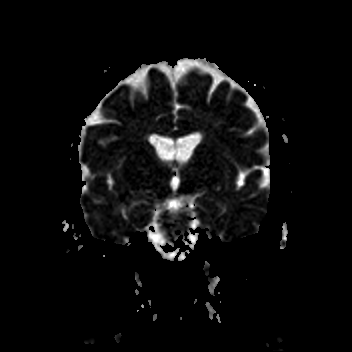
[im 37/37]
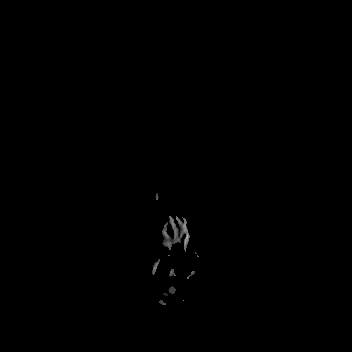

[Series 9: T1 · sagittal · 5.0mm · 0.62mm/px · 2 of 23 slices shown (1 of 2)]
[im 1/23]
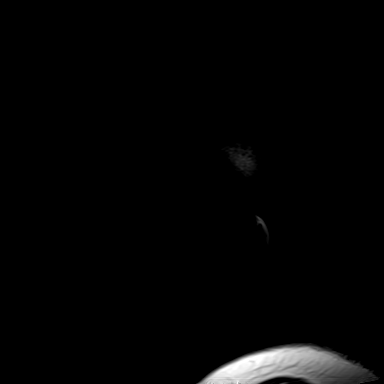
[im 23/23]
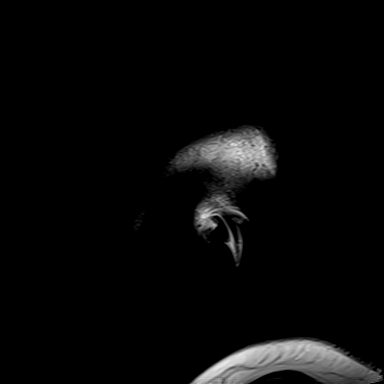

[Series 10: T2 · axial · 5.0mm · 0.53mm/px · z∈[-115,+50]mm · 2 of 29 slices shown (1 of 2)]
[im 1/29]
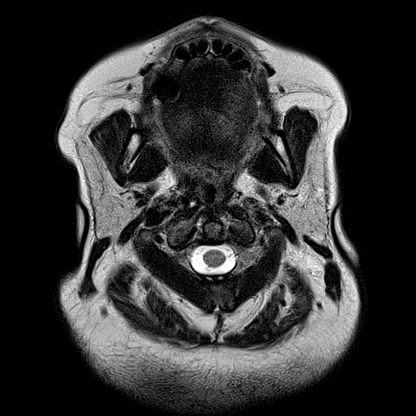
[im 29/29]
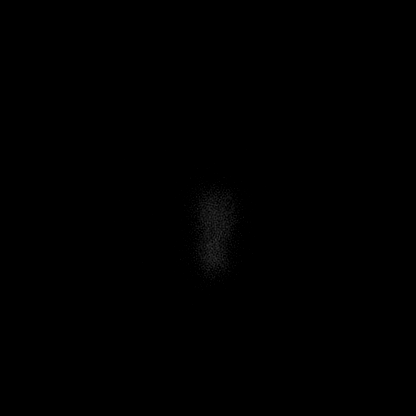

[Series 11: mag_images · axial · 3.0mm · 0.90mm/px · z∈[-119,+55]mm · 4 of 60 slices shown]
[im 1/60]
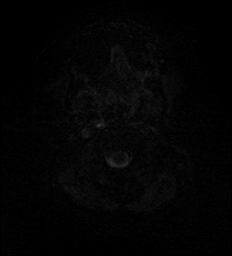
[im 20/60]
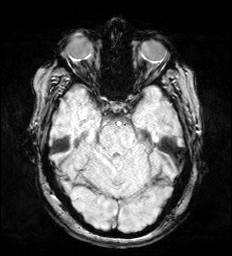
[im 40/60]
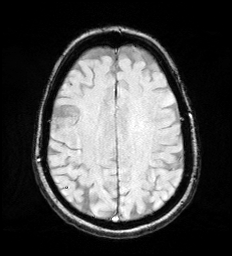
[im 60/60]
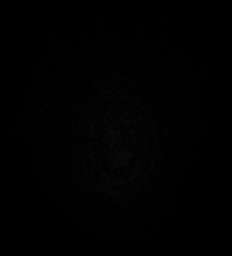

[Series 12: pha_images · axial · 3.0mm · 0.90mm/px · z∈[-119,+55]mm · 4 of 60 slices shown]
[im 1/60]
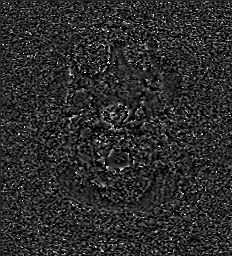
[im 20/60]
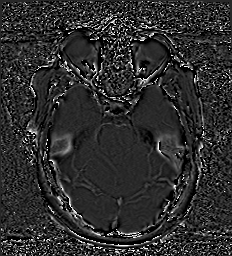
[im 40/60]
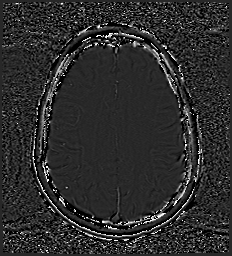
[im 60/60]
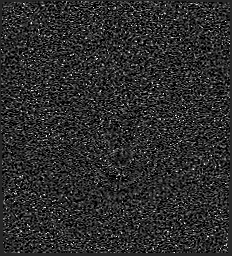

[Series 13: swi_images · axial · 3.0mm · 0.90mm/px · z∈[-119,+55]mm · 4 of 60 slices shown]
[im 1/60]
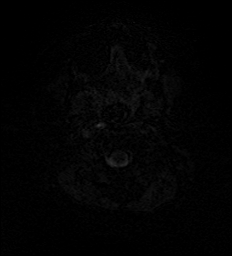
[im 20/60]
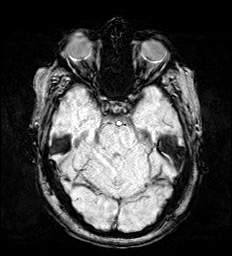
[im 40/60]
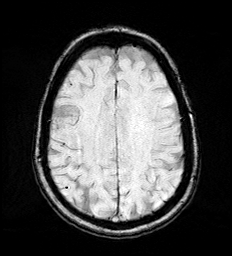
[im 60/60]
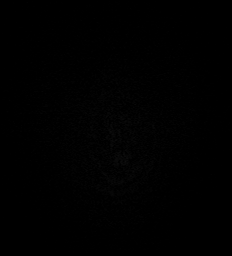

[Series 15: FLAIR · axial · 3.0mm · 0.53mm/px · z∈[-112,+47]mm · 4 of 55 slices shown]
[im 1/55]
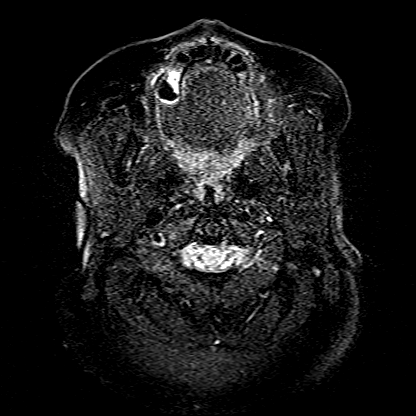
[im 19/55]
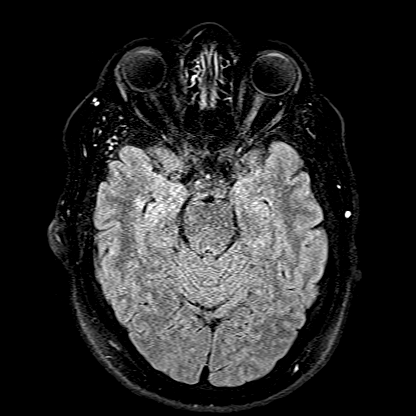
[im 37/55]
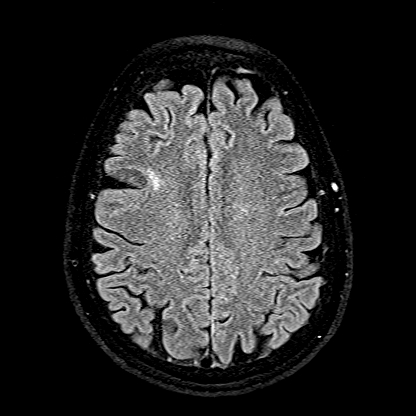
[im 55/55]
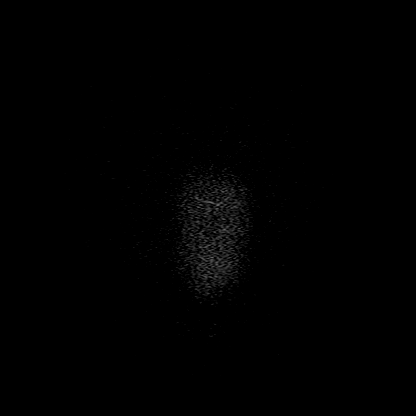

[Series 16: T1 · axial · 1.0mm · 0.98mm/px · z∈[-116,+56]mm · 12 of 176 slices shown (2 of 2)]
[im 1/176]
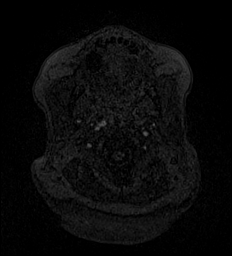
[im 15/176]
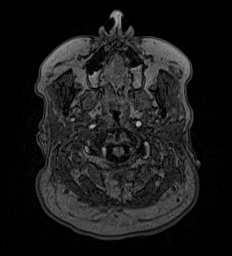
[im 30/176]
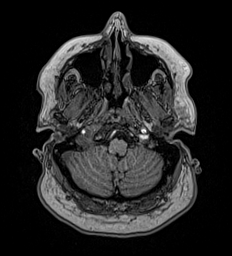
[im 44/176]
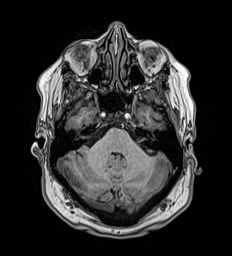
[im 59/176]
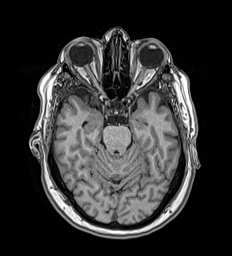
[im 73/176]
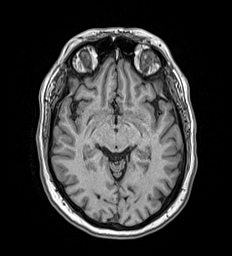
[im 88/176]
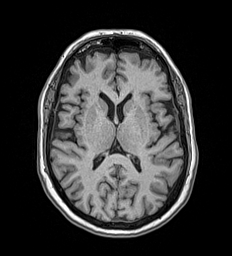
[im 103/176]
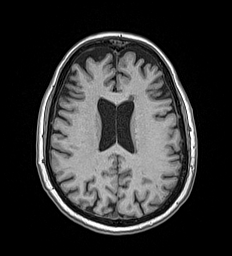
[im 117/176]
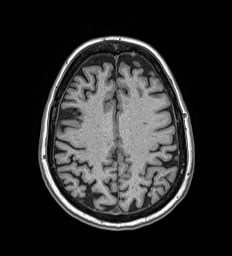
[im 132/176]
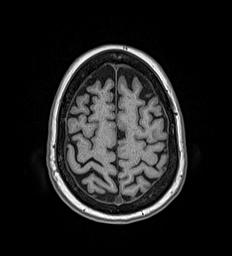
[im 146/176]
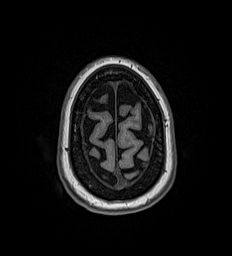
[im 176/176]
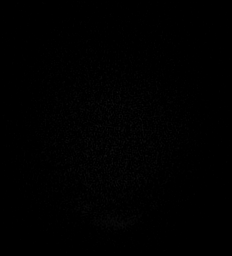

[Series 17: T2 · coronal · 5.0mm · 0.57mm/px · 2 of 30 slices shown (2 of 2)]
[im 1/30]
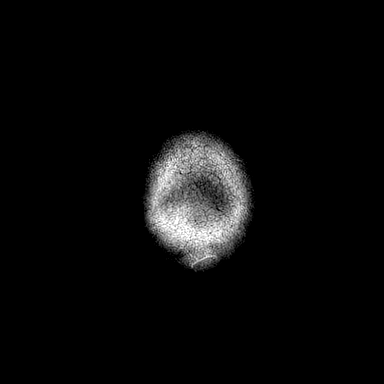
[im 30/30]
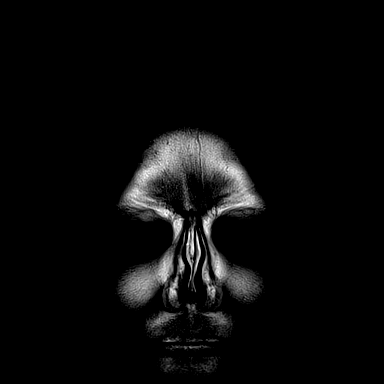

[47 of 48 positions shown; findings below may reference images not displayed]

FINDINGS: Brain: There is no acute infarction or intracranial hemorrhage.
There is no intracranial mass, mass effect, or edema. Scattered foci
of T2 hyperintensity in the supratentorial periventricular and
subcortical/juxtacortical white matter. There is notable involvement
of the white matter adjacent to the right temporal horn and
pericallosal white matter. No definite involvement of the brainstem
or cerebellum. Ventricles and sulci are normal in size and
configuration. There is no hydrocephalus or extra-axial fluid
collection.

Vascular: Major vessel flow voids at the skull base are preserved.

Skull and upper cervical spine: Normal marrow signal is preserved.

Sinuses/Orbits: Paranasal sinuses are aerated. Orbits are
unremarkable.

Other: Sella is unremarkable.  Mastoid air cells are clear.
IMPRESSION: No acute infarction, hemorrhage, or mass.

Nonspecific foci of abnormal signal in the cerebral white matter.
Although these could be related to reported history of lupus,
consider evaluation for a primary demyelinating disease such as
multiple sclerosis if not already performed.

## 2022-04-02 IMAGING — MR MR THORACIC SPINE WO/W CM
7 of 9 series · 29 of 48 positions shown · IV contrast (Contrast agent)
Comparison: None Available.

CLINICAL DATA: Suspected demyelinating disease

EXAM:
MRI CERVICAL AND THORACIC SPINE WITHOUT AND WITH CONTRAST
TECHNIQUE: Multiplanar and multiecho pulse sequences of the cervical spine, to
include the craniocervical junction and cervicothoracic junction,
and the thoracic spine, were obtained without and with intravenous
contrast.
CONTRAST:  10mL GADAVIST GADOBUTROL 1 MMOL/ML IV SOLN

[Series 20: T1 · sagittal · 5.0mm · 1.88mm/px · 1 of 9 slices shown (1 of 3)]
[im 1/9]
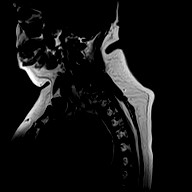

[Series 21: T2 · sagittal · 3.0mm · 1.06mm/px · 3 of 19 slices shown (1 of 2)]
[im 1/19]
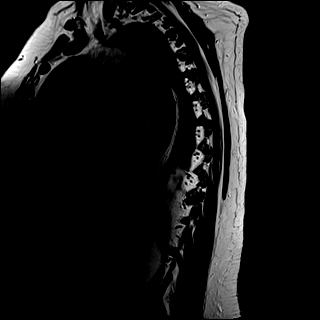
[im 10/19]
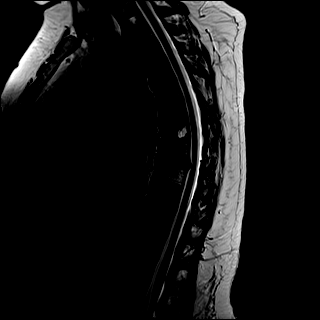
[im 19/19]
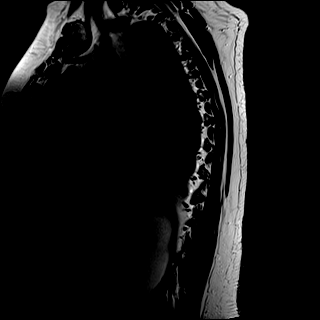

[Series 22: T1 · sagittal · 3.0mm · 1.06mm/px · 4 of 19 slices shown (2 of 3)]
[im 1/19]
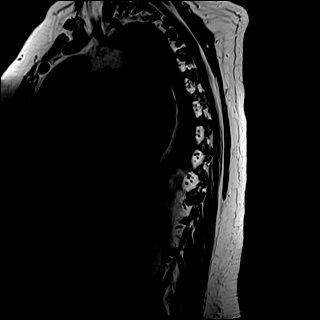
[im 7/19]
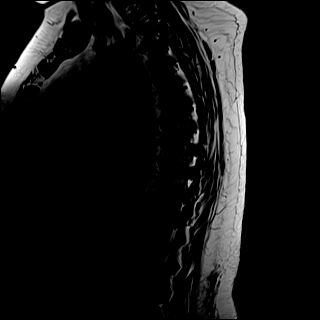
[im 13/19]
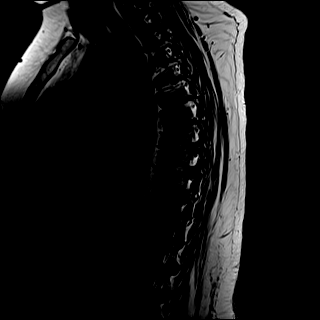
[im 19/19]
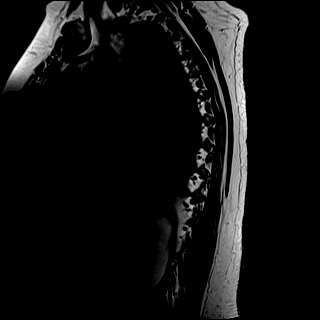

[Series 23: STIR · sagittal · 3.0mm · 0.53mm/px · 1 of 19 slices shown]
[im 1/19]
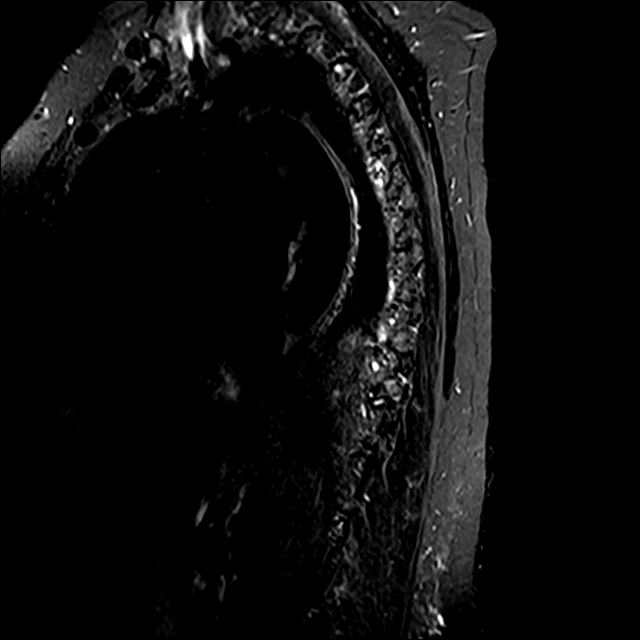

[Series 24: T2 · axial · 4.0mm · 0.59mm/px · z∈[-443,-244]mm · 8 of 39 slices shown (2 of 2)]
[im 1/39]
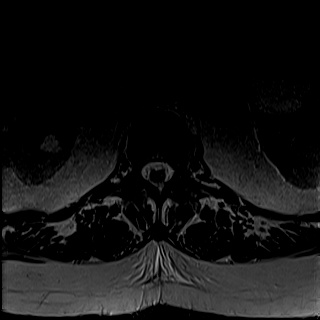
[im 6/39]
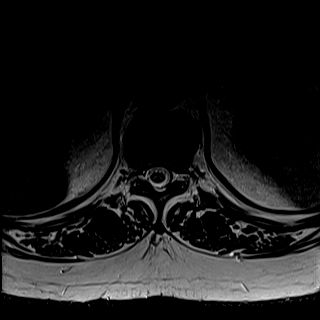
[im 11/39]
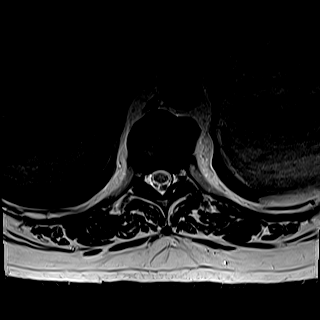
[im 17/39]
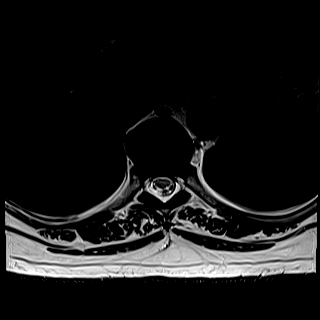
[im 22/39]
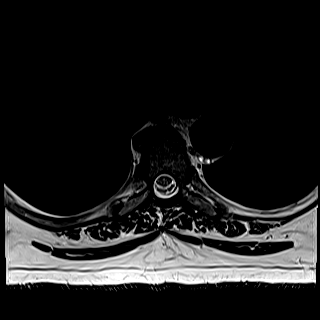
[im 28/39]
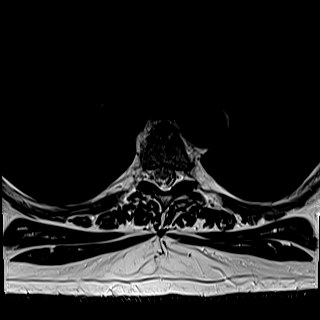
[im 33/39]
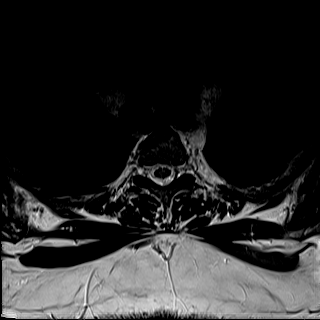
[im 39/39]
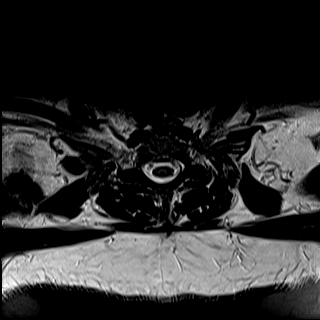

[Series 26: T1 · axial · non-contrast · 4.0mm · 0.37mm/px · z∈[-443,-244]mm · 8 of 39 slices shown (3 of 3)]
[im 1/39]
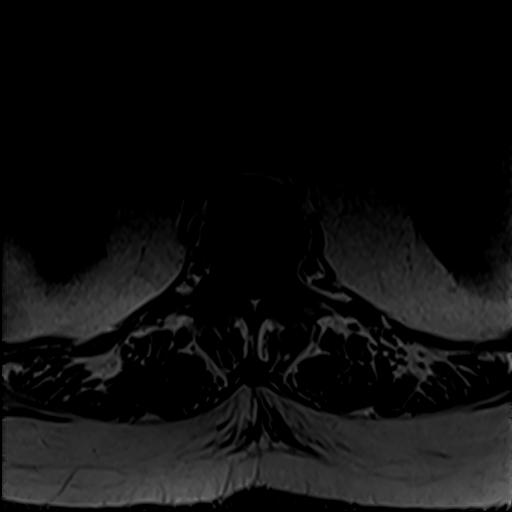
[im 6/39]
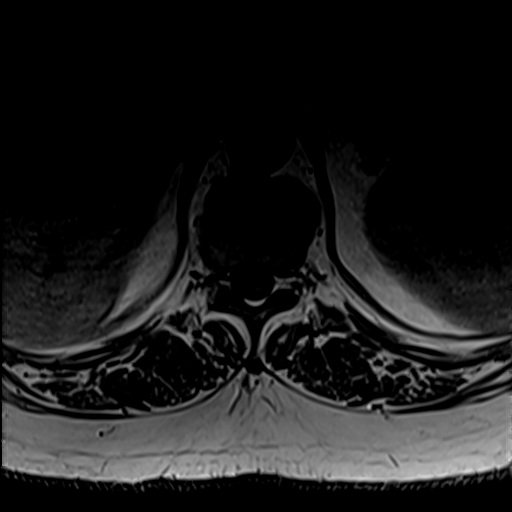
[im 11/39]
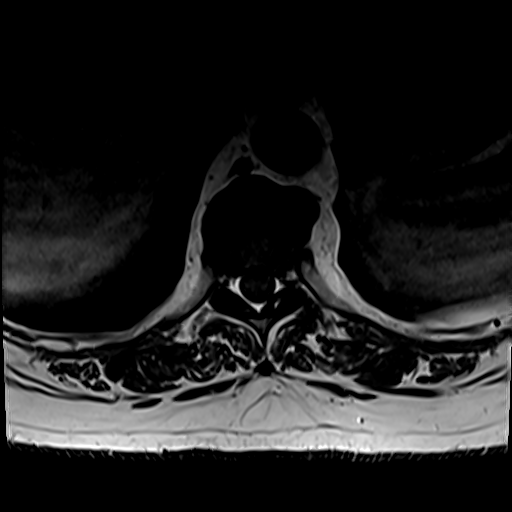
[im 17/39]
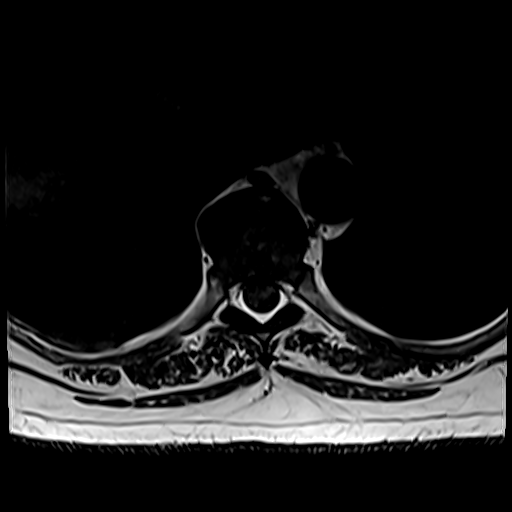
[im 22/39]
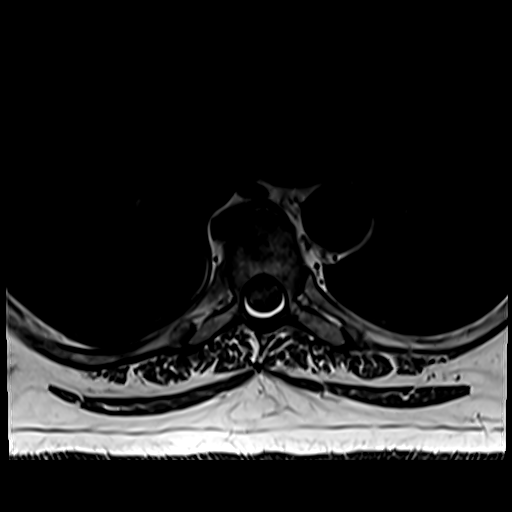
[im 28/39]
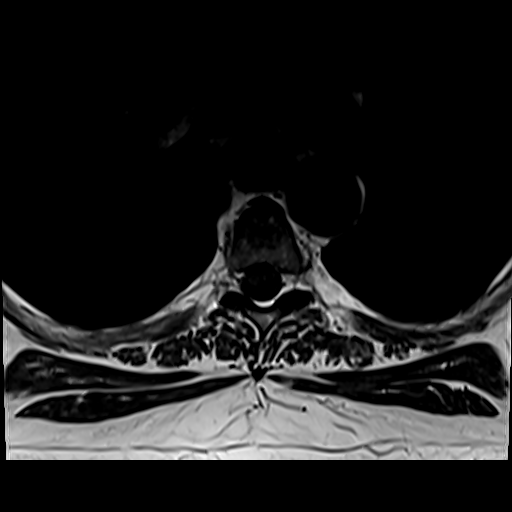
[im 33/39]
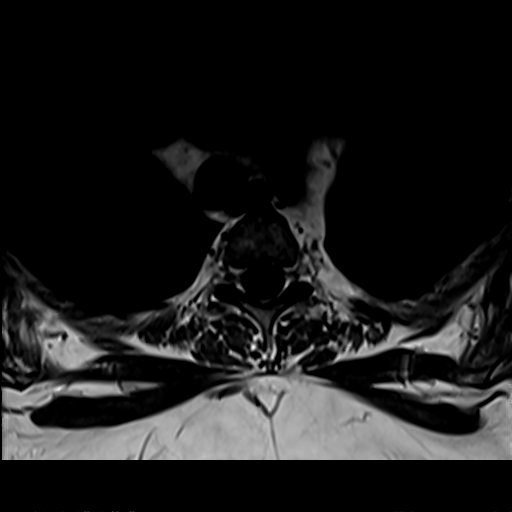
[im 39/39]
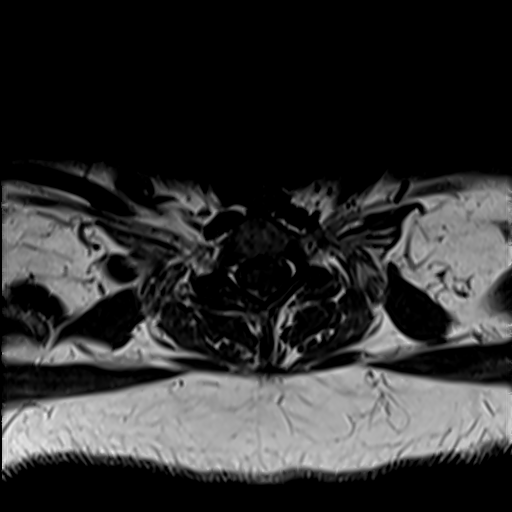

[Series 28: T1 fat-sat post-contrast · sagittal · 3.0mm · 1.06mm/px · 4 of 19 slices shown]
[im 1/19]
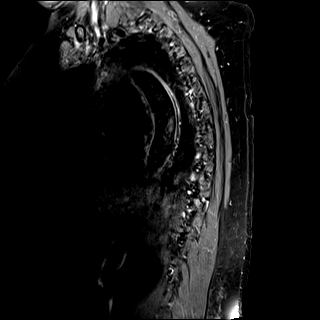
[im 7/19]
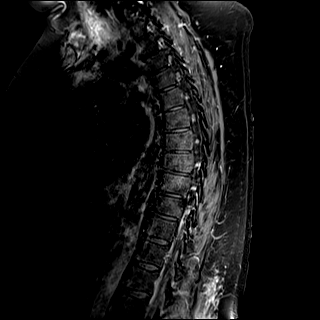
[im 13/19]
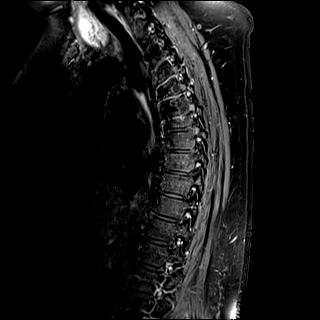
[im 19/19]
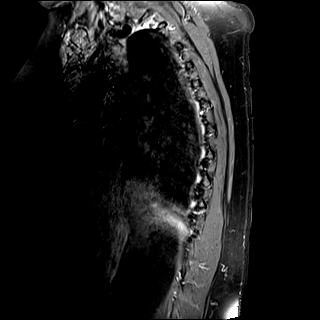

[29 of 48 positions shown; findings below may reference images not displayed]

FINDINGS: MRI CERVICAL SPINE FINDINGS

Alignment: Physiologic.

Vertebrae: No fracture, evidence of discitis, or bone lesion.

Cord: Normal signal and morphology. No abnormal contrast
enhancement.

Posterior Fossa, vertebral arteries, paraspinal tissues: Enlarged
left thyroid lobe, incompletely visualized.

Disc levels:

C1-2: Unremarkable.

C2-3: Normal disc space and facet joints. There is no spinal canal
stenosis. No neural foraminal stenosis.

C3-4: Large left foraminal uncovertebral osteophyte. There is no
spinal canal stenosis. Severe left neural foraminal stenosis.

C4-5: Normal disc space and facet joints. There is no spinal canal
stenosis. No neural foraminal stenosis.

C5-6: Normal disc space and facet joints. There is no spinal canal
stenosis. No neural foraminal stenosis.

C6-7: Small disc bulge. There is no spinal canal stenosis. No neural
foraminal stenosis.

C7-T1: Normal disc space and facet joints. There is no spinal canal
stenosis. No neural foraminal stenosis.

MRI THORACIC SPINE FINDINGS

Alignment:  Physiologic.

Vertebrae: No fracture, evidence of discitis, or bone lesion.

Cord:  Normal signal and morphology.

Paraspinal and other soft tissues: Negative.

Disc levels:

No spinal canal stenosis.  Small central disc protrusion at T8-9.
IMPRESSION: 1. No evidence of demyelinating disease of the cervical or thoracic
spine.
2. Severe left C3-4 neural foraminal stenosis secondary to
uncovertebral osteophyte.
3. Enlarged left thyroid lobe, incompletely visualized. Outpatient
thyroid ultrasound recommended.

## 2022-04-02 IMAGING — CT CT HEAD W/O CM
4 series · 17 of 47 positions shown, 19 images · non-contrast
Comparison: None

CLINICAL DATA: 55-year-old female with headache



[Series 2: head wo · axial · 0.44mm/px · z∈[-186,-56]mm · 7 of 36 slices shown, 9 images]
[im 5/36  brain]
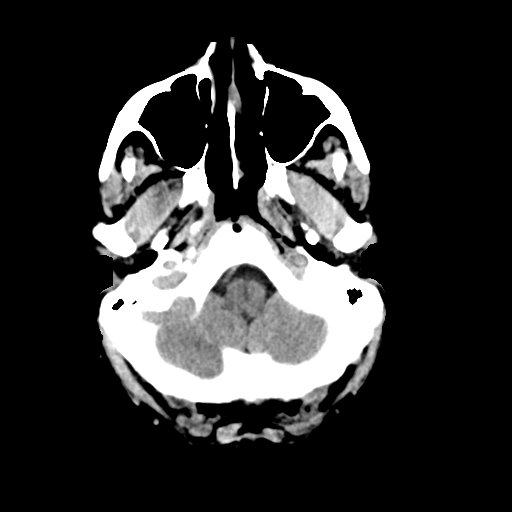
[im 5/36  bone]
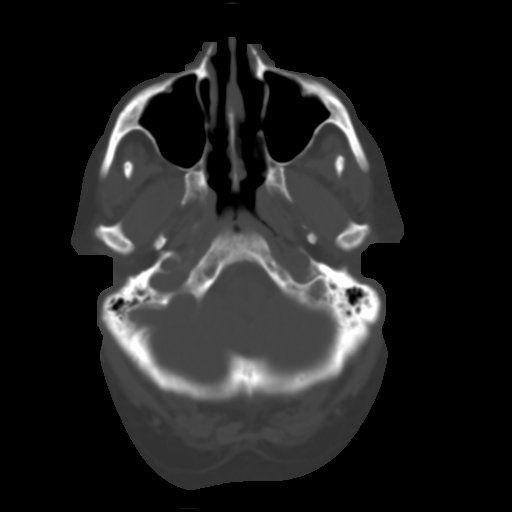
[im 9/36  brain]
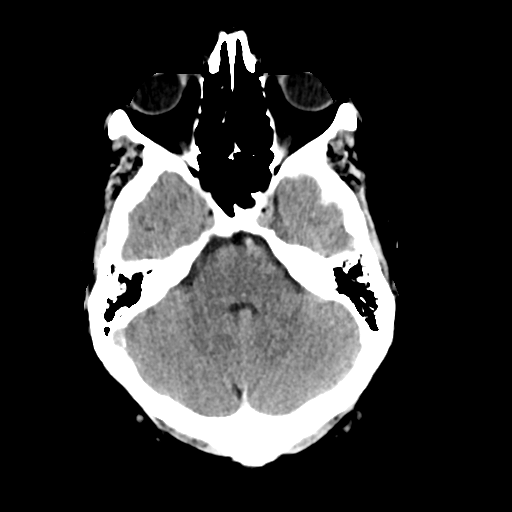
[im 14/36  brain]
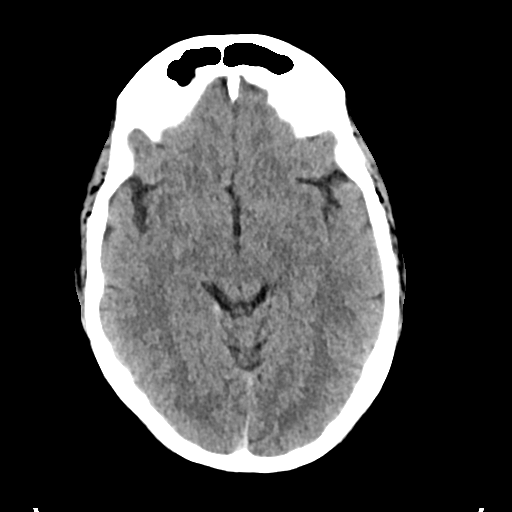
[im 18/36  brain]
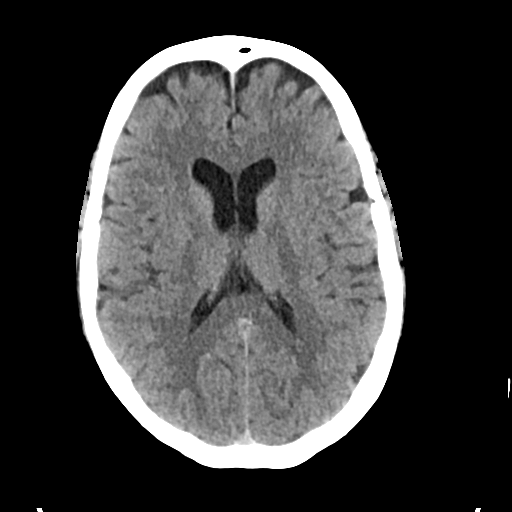
[im 22/36  brain]
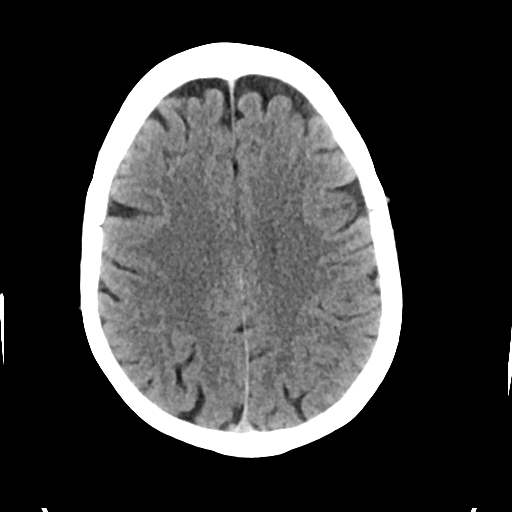
[im 22/36  bone]
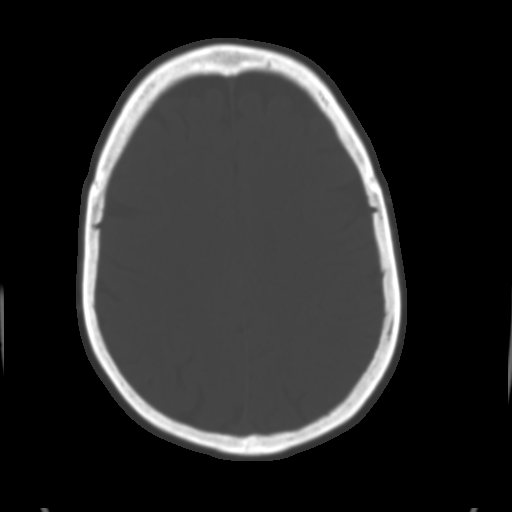
[im 27/36  brain]
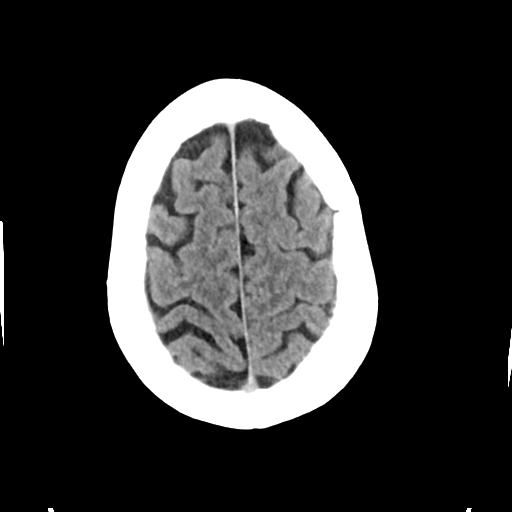
[im 31/36  brain]
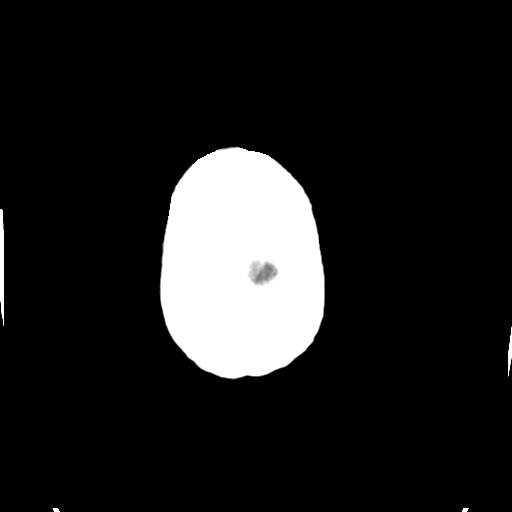

[Series 3: head bone · axial · 0.44mm/px · z∈[-190,-126]mm · 4 of 90 slices shown]
[im 9/90  bone]
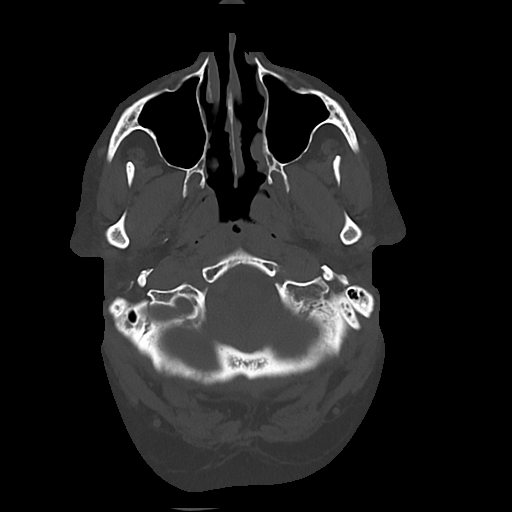
[im 18/90  bone]
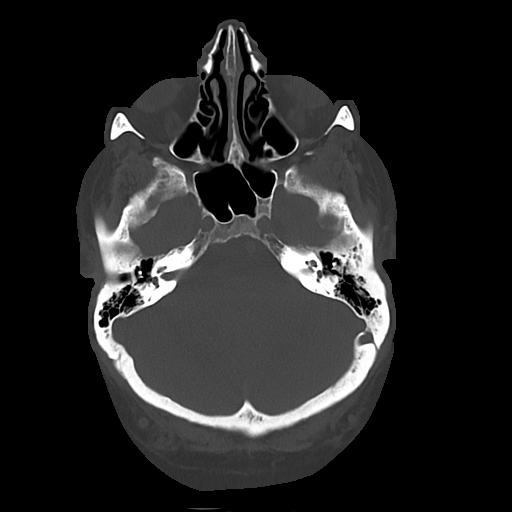
[im 27/90  bone]
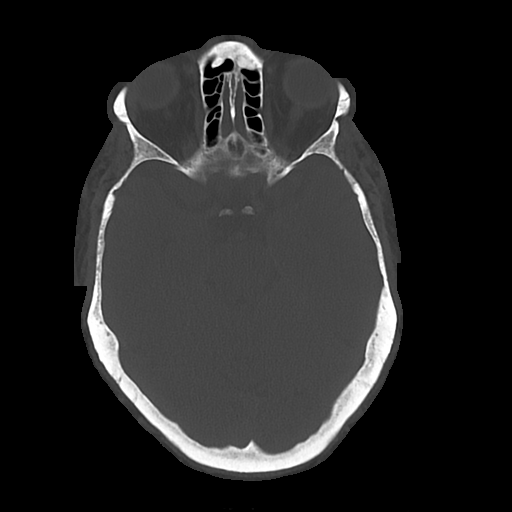
[im 41/90  bone]
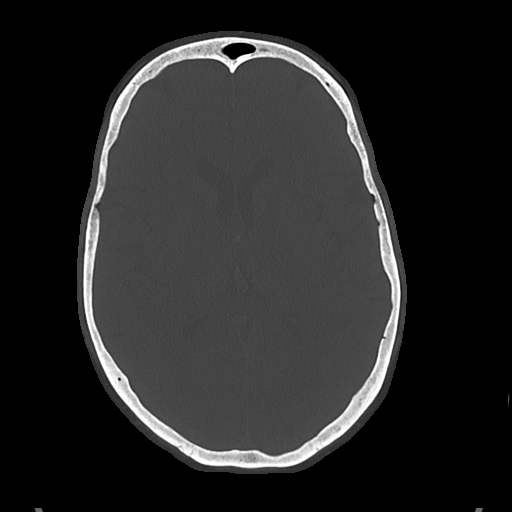

[Series 4: cor soft · coronal · 0.35mm/px · 3 of 77 slices shown]
[im 26/77  brain]
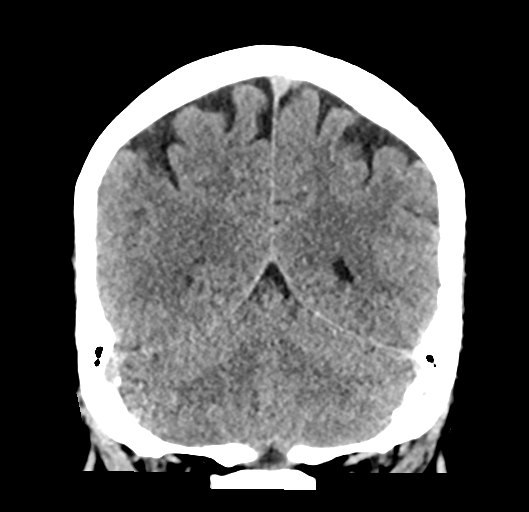
[im 34/77  brain]
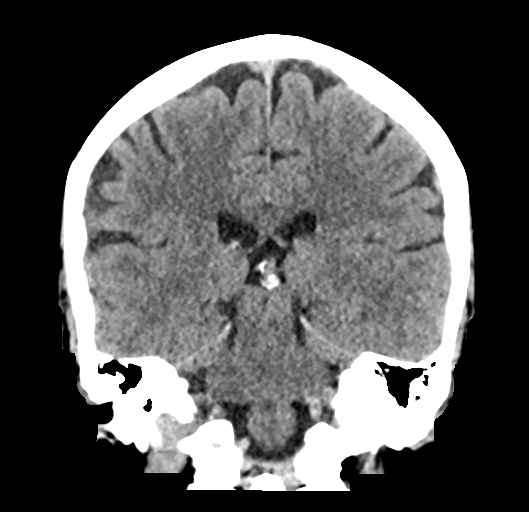
[im 43/77  brain]
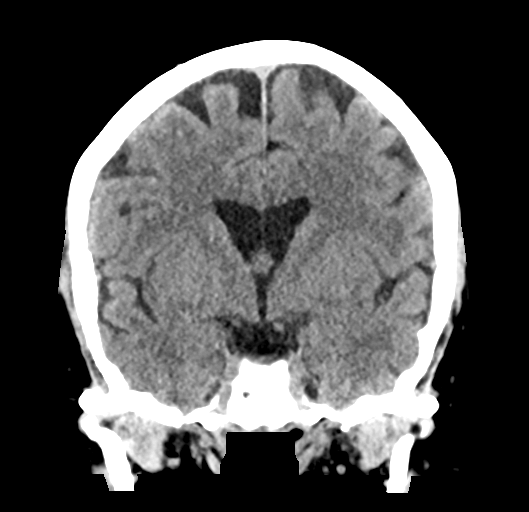

[Series 5: sag soft · sagittal · 0.35mm/px · 3 of 60 slices shown]
[im 20/60  brain]
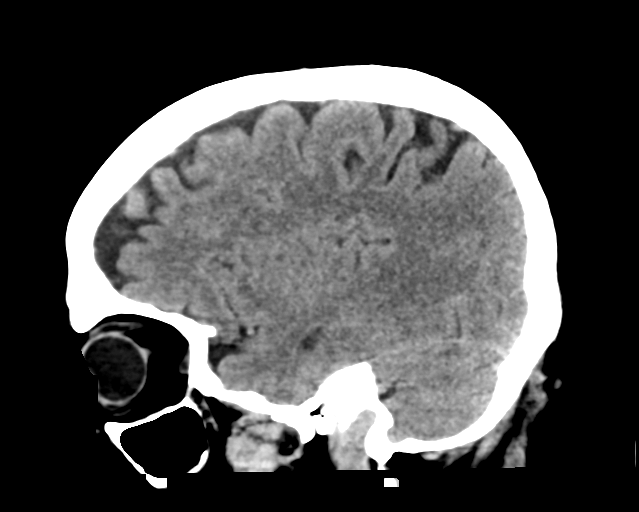
[im 30/60  brain]
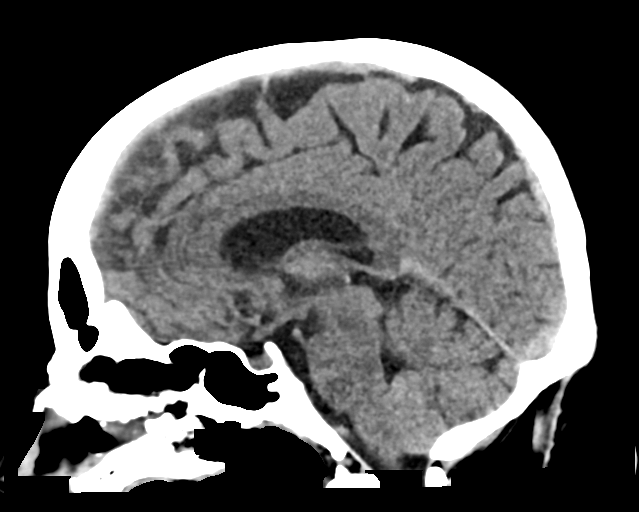
[im 40/60  brain]
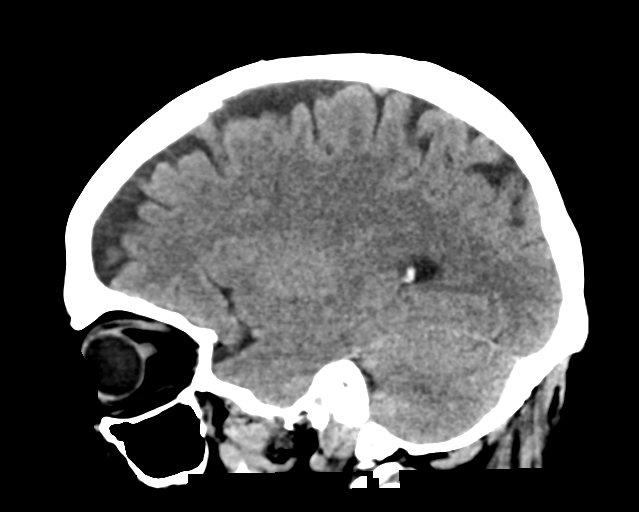

[17 of 47 positions shown; findings below may reference images not displayed]

FINDINGS: Brain: No acute intracranial hemorrhage. No midline shift or mass
effect. Gray-white differentiation maintained. Unremarkable
appearance of the ventricular system.

Vascular: Unremarkable.

Skull: No acute fracture.  No aggressive bone lesion identified.

Sinuses/Orbits: Unremarkable appearance of the orbits. Mastoid air
cells clear. No middle ear effusion. No significant sinus disease.

Other: None
IMPRESSION: Negative head CT

## 2022-04-02 IMAGING — MR MR ORBITS WO/W CM
4 of 7 series · 25 of 48 positions shown · IV contrast (Contrast agent)
Comparison: None Available.

CLINICAL DATA: Left eye blurriness

EXAM:
MRI OF THE ORBITS WITHOUT AND WITH CONTRAST
TECHNIQUE: Multiplanar, multi-echo pulse sequences of the orbits and
surrounding structures were acquired including fat saturation
techniques, before and after intravenous contrast administration.
CONTRAST:  10mL GADAVIST GADOBUTROL 1 MMOL/ML IV SOLN

[Series 5: T1 · sagittal · 3.0mm · 0.38mm/px · 8 of 40 slices shown (1 of 2)]
[im 1/40]
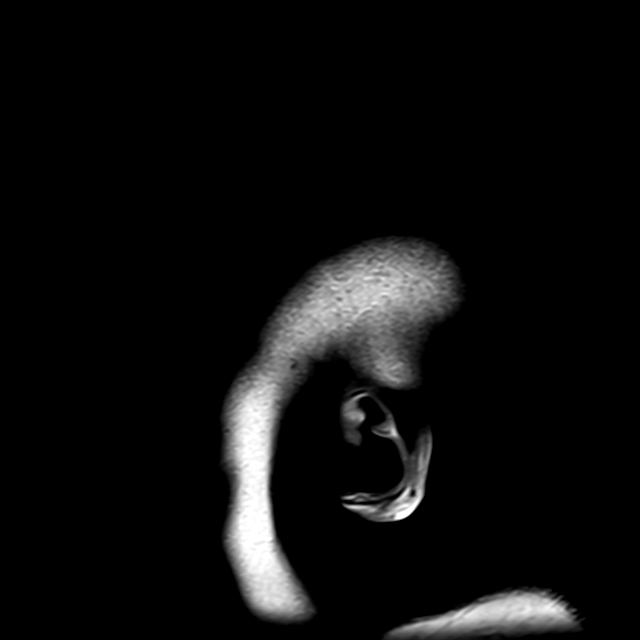
[im 6/40]
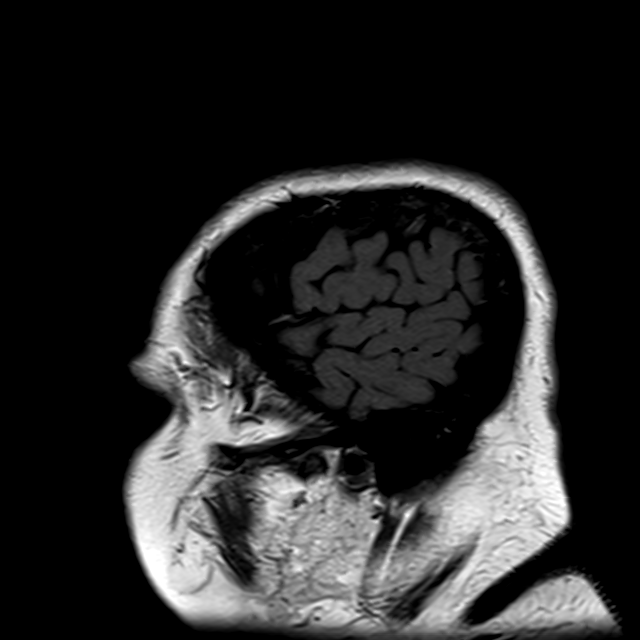
[im 12/40]
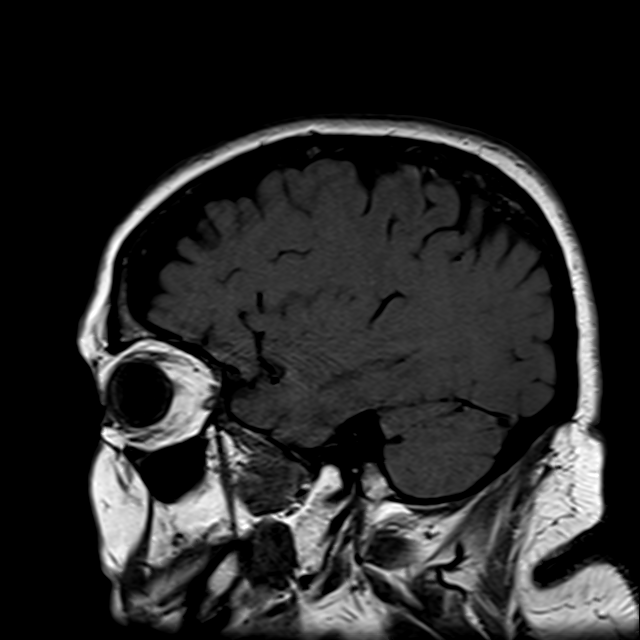
[im 17/40]
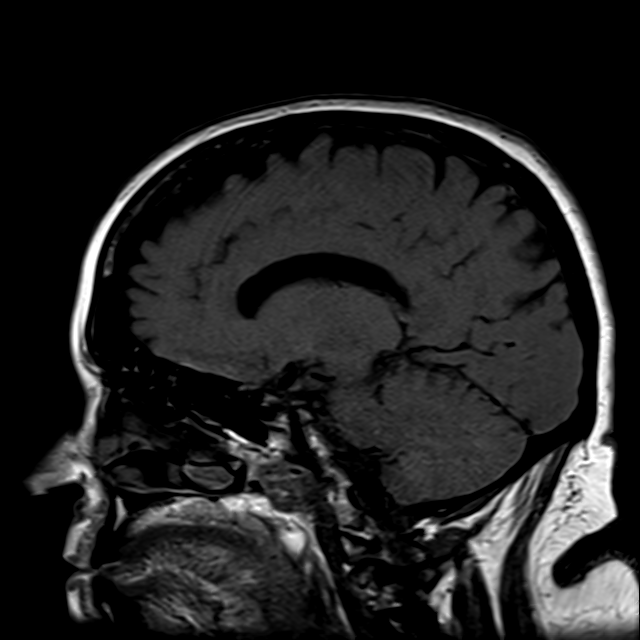
[im 23/40]
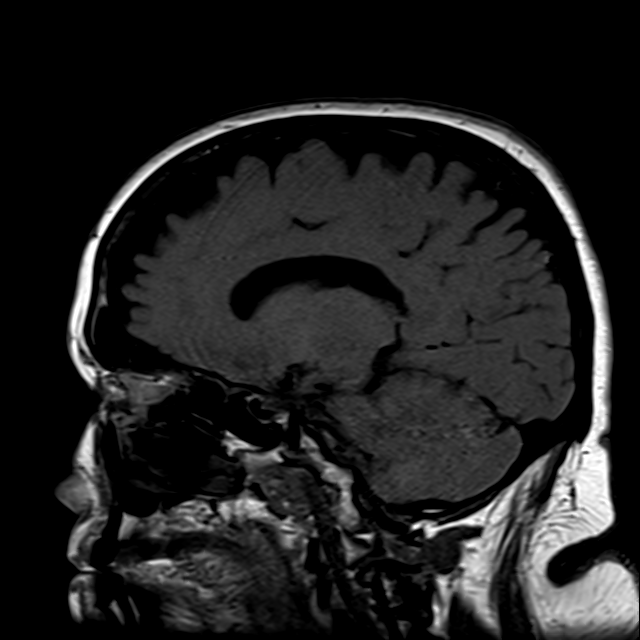
[im 28/40]
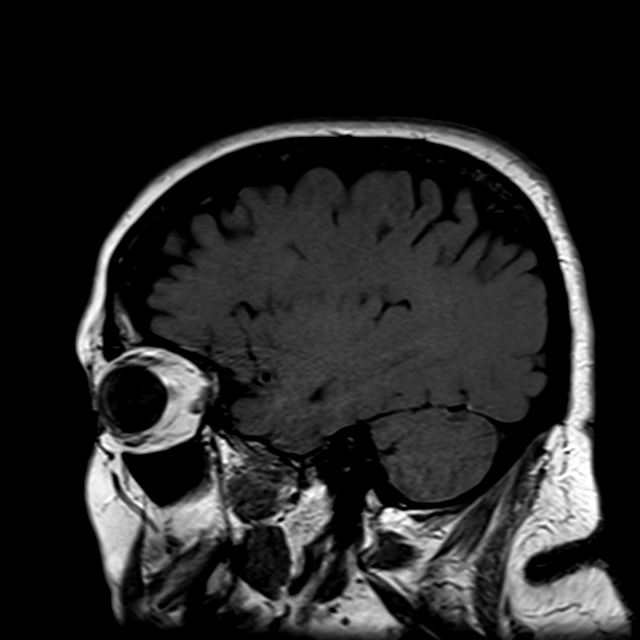
[im 34/40]
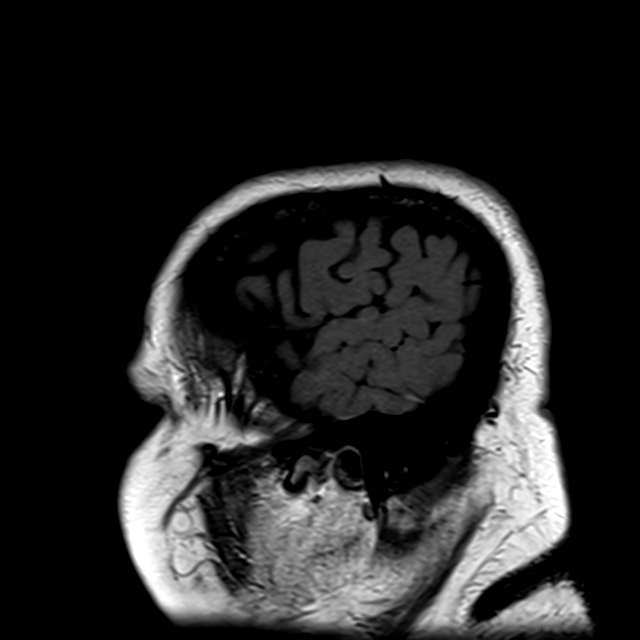
[im 40/40]
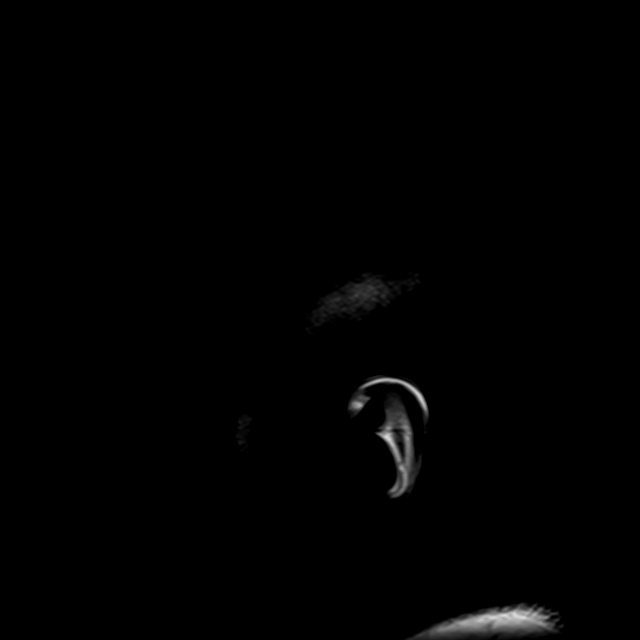

[Series 7: T2 · axial · 3.0mm · 0.78mm/px · z∈[-84,-20]mm · 4 of 17 slices shown (1 of 2)]
[im 1/17]
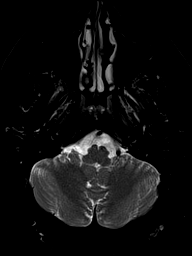
[im 6/17]
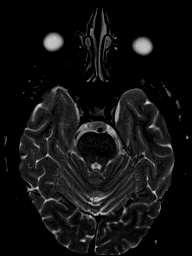
[im 11/17]
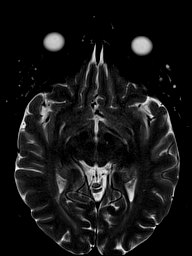
[im 17/17]
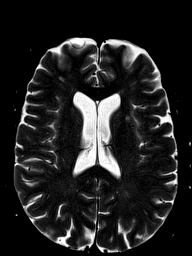

[Series 9: T2 · coronal · 3.0mm · 0.78mm/px · 8 of 35 slices shown (2 of 2)]
[im 1/35]
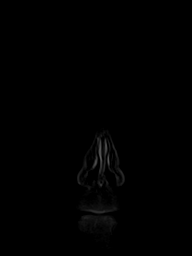
[im 5/35]
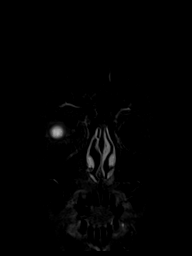
[im 10/35]
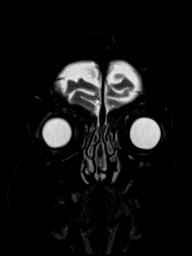
[im 15/35]
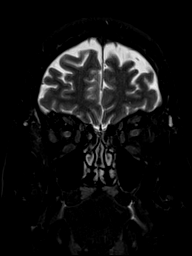
[im 20/35]
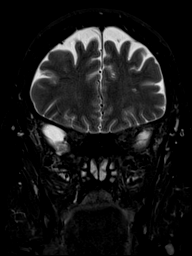
[im 25/35]
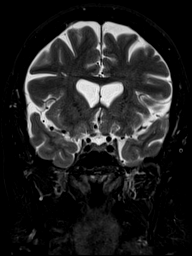
[im 30/35]
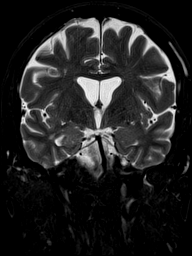
[im 35/35]
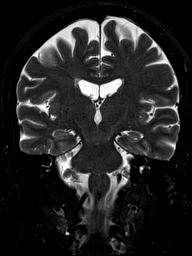

[Series 10: T1 · axial · non-contrast · 3.0mm · 0.31mm/px · z∈[-87,-21]mm · 5 of 23 slices shown (2 of 2)]
[im 1/23]
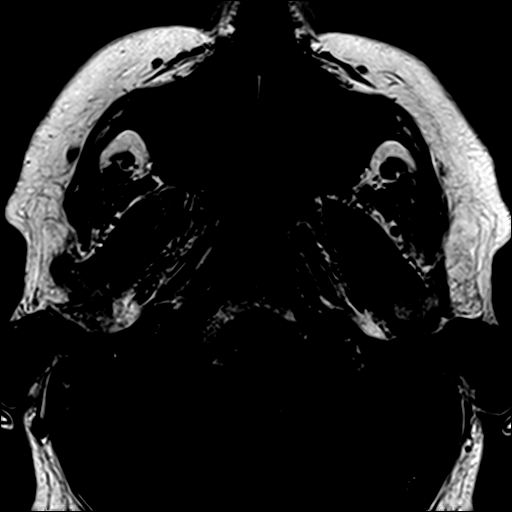
[im 6/23]
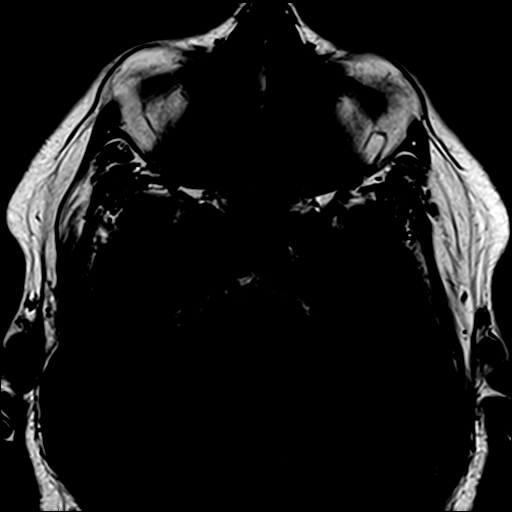
[im 12/23]
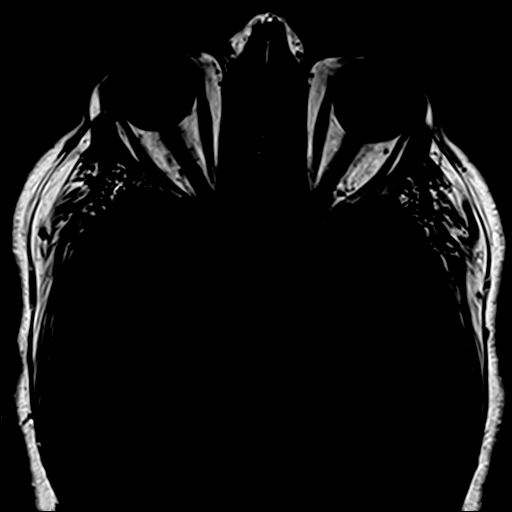
[im 17/23]
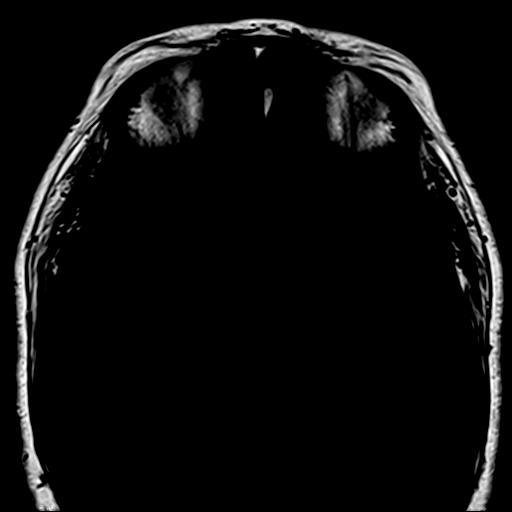
[im 23/23]
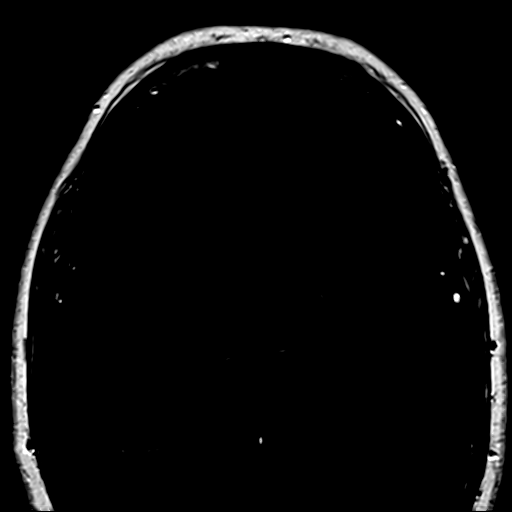

[25 of 48 positions shown; findings below may reference images not displayed]

FINDINGS: Orbits: There is abnormal contrast enhancement of the proximal
intraorbital left optic nerve. The right optic nerve is normal. The
other intraorbital structures are normal.

Visualized sinuses: Normal

Soft tissues: Normal
IMPRESSION: Abnormal contrast enhancement of the proximal intraorbital left
optic nerve, consistent with optic neuritis.

## 2022-04-02 IMAGING — MR MR HEAD W/ CM
6 series · 48 of 48 positions shown · IV contrast (gadavist)
Comparison: Noncontrast brain MRI [DATE]

CLINICAL DATA: Left eye blurriness

EXAM:
MRI HEAD WITH CONTRAST
TECHNIQUE: Multiplanar, multiecho pulse sequences of the brain and surrounding
structures were obtained with intravenous contrast.
CONTRAST:  10mL GADAVIST GADOBUTROL 1 MMOL/ML IV SOLN

[Series 5: ax dwi_tracew · axial · 3.0mm · 0.65mm/px · z∈[-94,+68]mm · 7 of 50 slices shown]
[im 1/50]
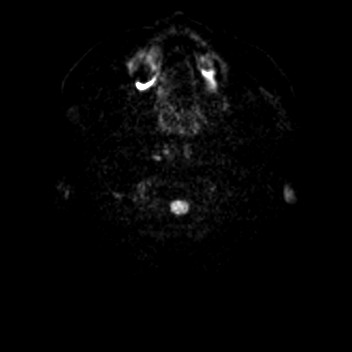
[im 9/50]
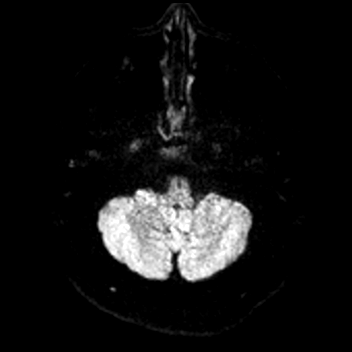
[im 17/50]
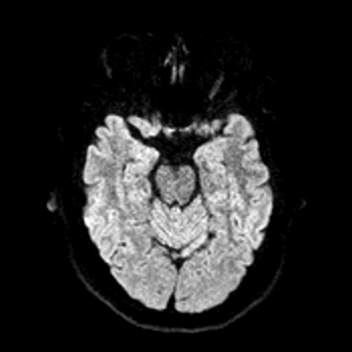
[im 25/50]
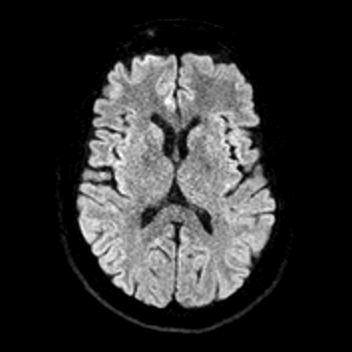
[im 33/50]
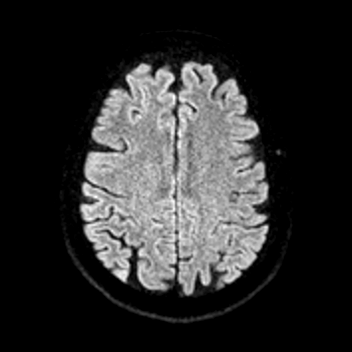
[im 41/50]
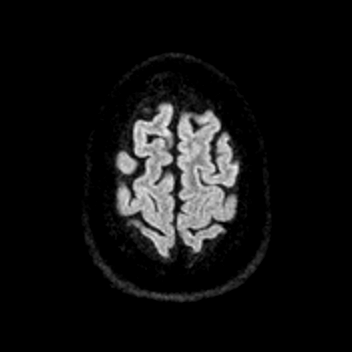
[im 50/50]
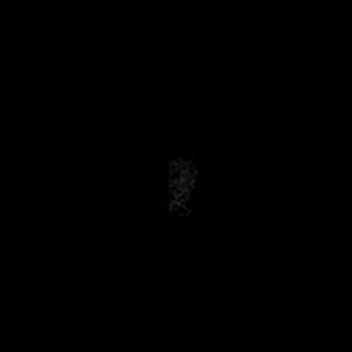

[Series 6: ax dwi_adc · axial · 3.0mm · 0.65mm/px · z∈[-94,+68]mm · 6 of 50 slices shown]
[im 1/50]
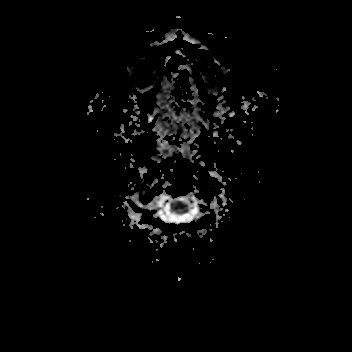
[im 10/50]
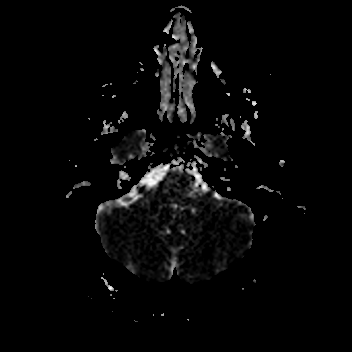
[im 20/50]
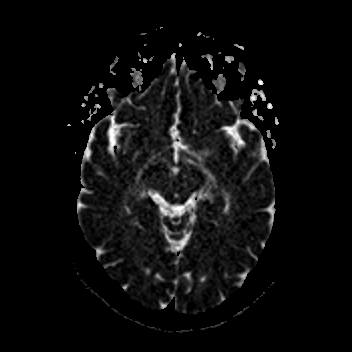
[im 30/50]
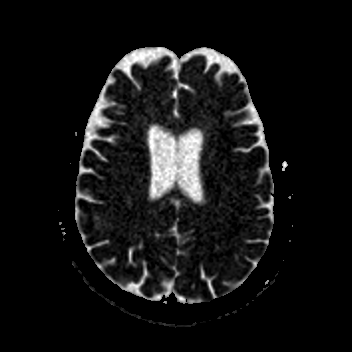
[im 40/50]
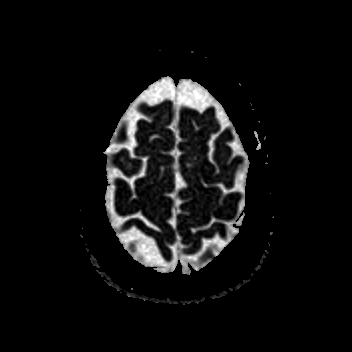
[im 50/50]
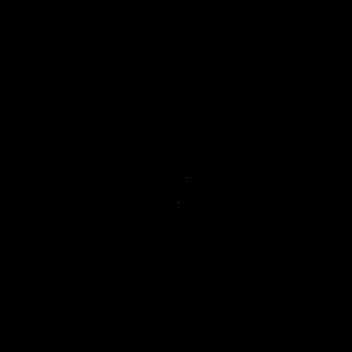

[Series 7: T2 post-contrast · coronal · 5.0mm · 0.57mm/px · 4 of 31 slices shown]
[im 1/31]
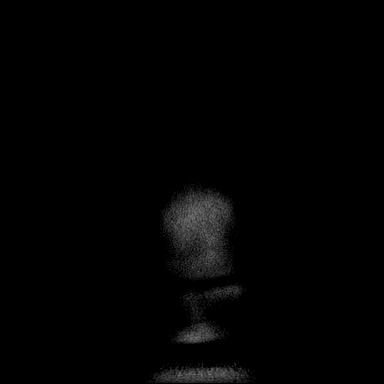
[im 11/31]
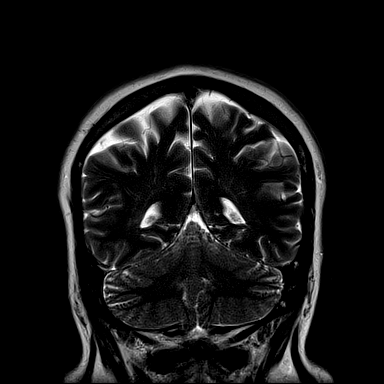
[im 21/31]
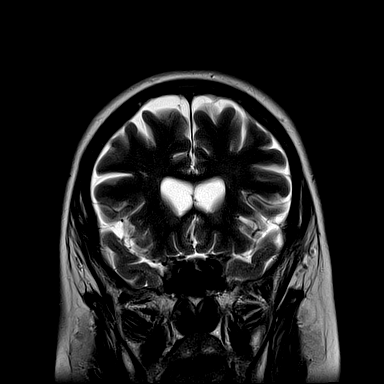
[im 31/31]
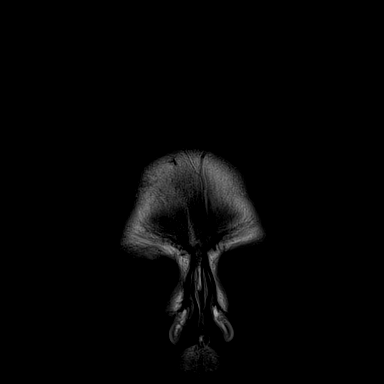

[Series 8: T1 post-contrast · axial · 1.0mm · 0.98mm/px · z∈[-108,+81]mm · 24 of 187 slices shown (1 of 3)]
[im 1/187]
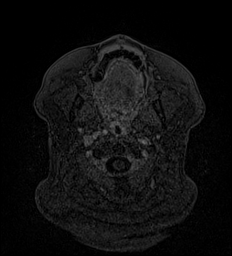
[im 9/187]
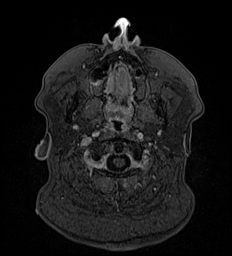
[im 17/187]
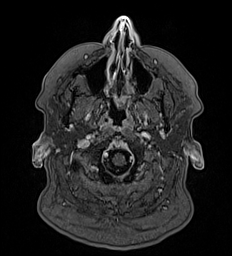
[im 25/187]
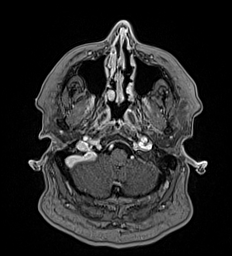
[im 33/187]
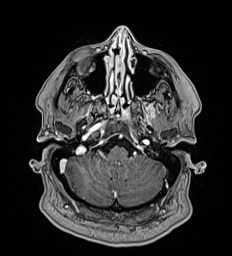
[im 41/187]
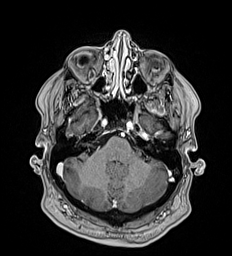
[im 49/187]
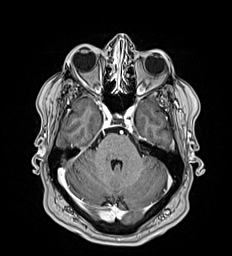
[im 57/187]
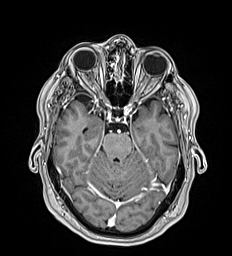
[im 65/187]
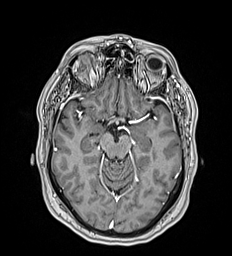
[im 73/187]
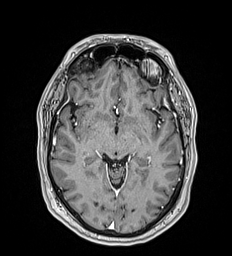
[im 81/187]
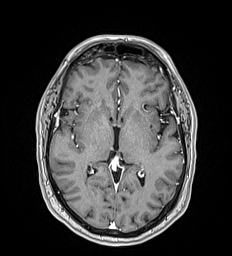
[im 89/187]
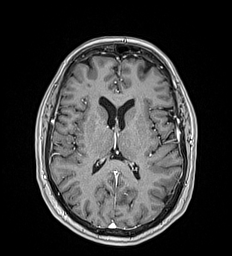
[im 98/187]
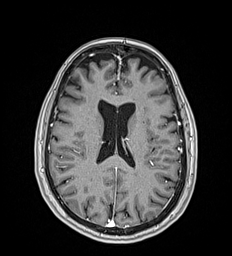
[im 106/187]
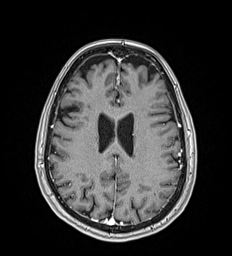
[im 114/187]
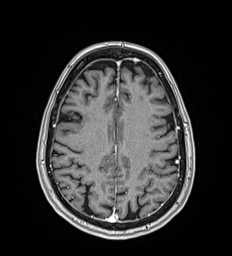
[im 122/187]
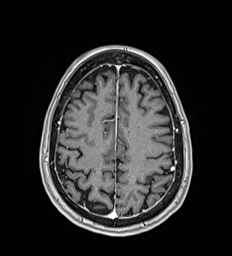
[im 130/187]
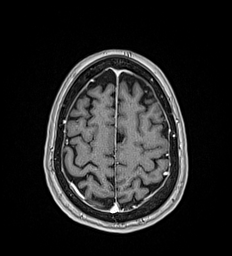
[im 138/187]
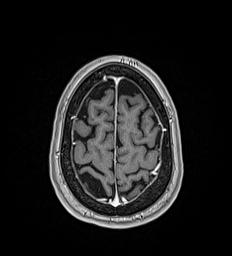
[im 146/187]
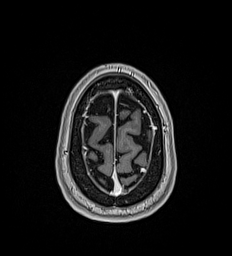
[im 154/187]
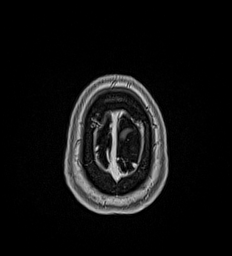
[im 162/187]
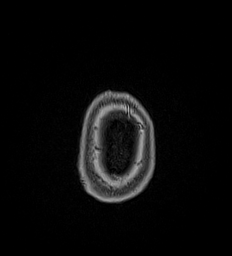
[im 170/187]
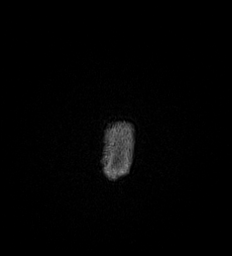
[im 178/187]
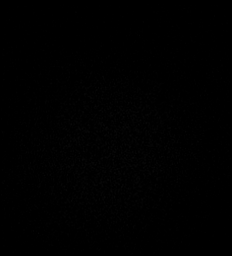
[im 187/187]
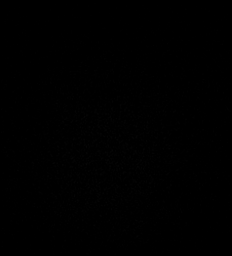

[Series 9: T1 post-contrast · coronal · 5.0mm · 0.57mm/px · 4 of 31 slices shown (2 of 3)]
[im 1/31]
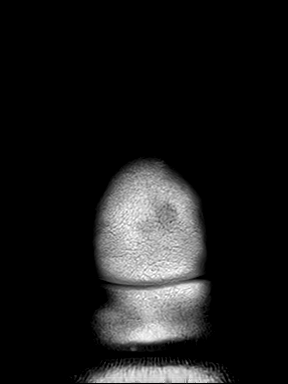
[im 11/31]
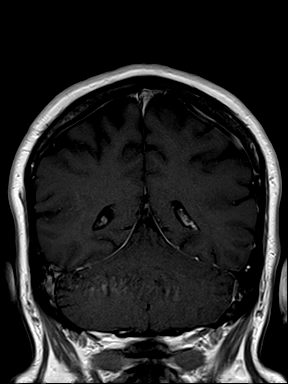
[im 21/31]
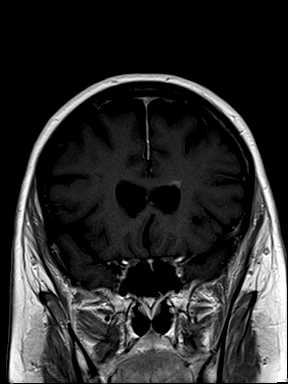
[im 31/31]
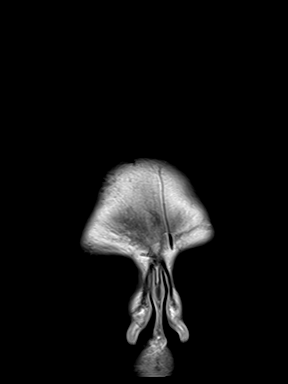

[Series 10: T1 post-contrast · sagittal · 5.0mm · 0.62mm/px · 3 of 25 slices shown (3 of 3)]
[im 1/25]
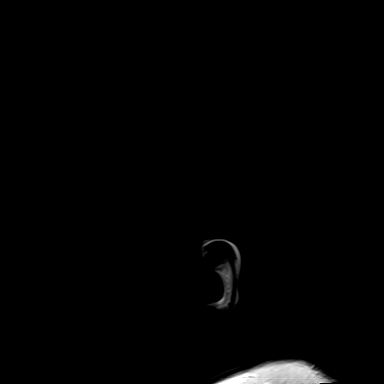
[im 13/25]
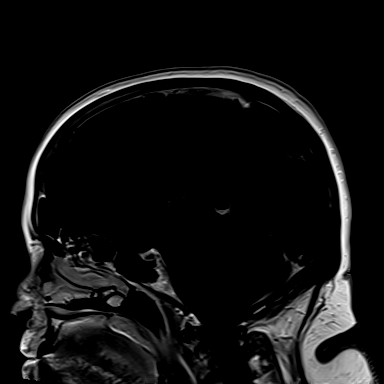
[im 25/25]
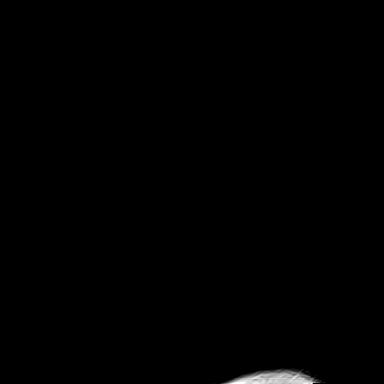

[48 of 48 positions shown; findings below may reference images not displayed]

FINDINGS: There is a single focus of abnormal contrast enhancement within the
left frontal white matter adjacent to the frontal horn of the
lateral ventricle. This corresponds to 1 of the hyperintense
T2-weighted signal lesions on the earlier noncontrast MRI.
IMPRESSION: Single focus of abnormal contrast enhancement within the left
frontal white matter adjacent to the frontal horn of the lateral
ventricle may indicate an active demyelinating lesion.

## 2022-04-02 IMAGING — MR MR CERVICAL SPINE WO/W CM
5 of 8 series · 29 of 48 positions shown · IV contrast (Contrast agent)
Comparison: None Available.

CLINICAL DATA: Suspected demyelinating disease

EXAM:
MRI CERVICAL AND THORACIC SPINE WITHOUT AND WITH CONTRAST
TECHNIQUE: Multiplanar and multiecho pulse sequences of the cervical spine, to
include the craniocervical junction and cervicothoracic junction,
and the thoracic spine, were obtained without and with intravenous
contrast.
CONTRAST:  10mL GADAVIST GADOBUTROL 1 MMOL/ML IV SOLN

[Series 9: T2 · sagittal · 3.0mm · 0.62mm/px · 4 of 15 slices shown (1 of 2)]
[im 1/15]
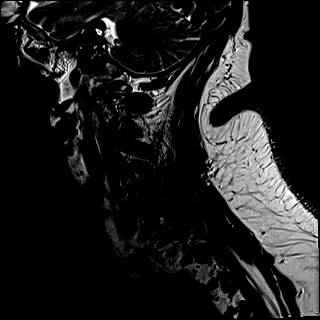
[im 5/15]
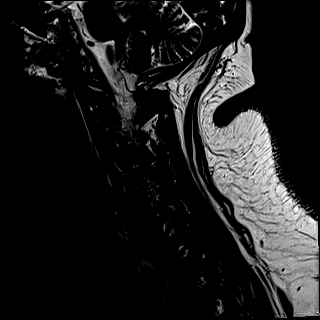
[im 10/15]
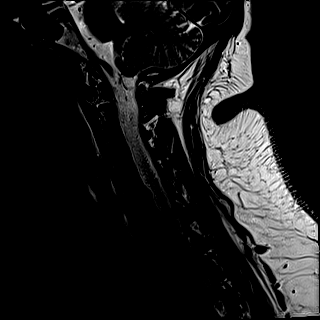
[im 15/15]
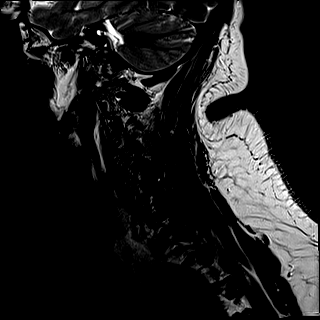

[Series 11: STIR · sagittal · 3.0mm · 0.62mm/px · 4 of 15 slices shown]
[im 1/15]
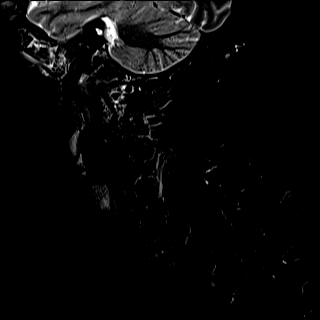
[im 5/15]
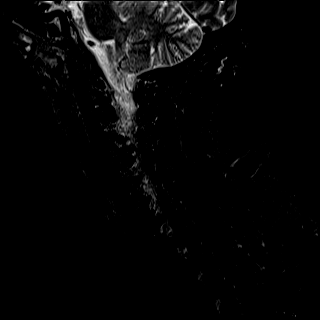
[im 10/15]
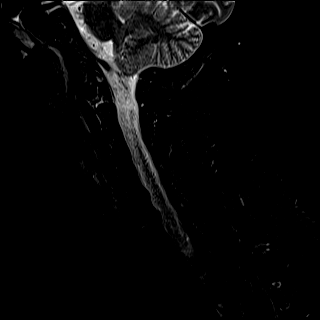
[im 15/15]
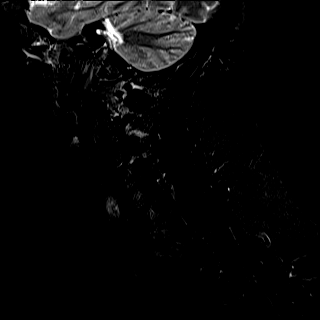

[Series 12: T2 · axial · 3.0mm · 0.70mm/px · z∈[-242,-150]mm · 8 of 29 slices shown (2 of 2)]
[im 1/29]
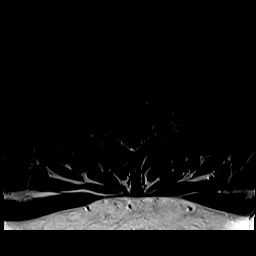
[im 5/29]
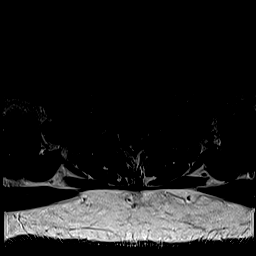
[im 9/29]
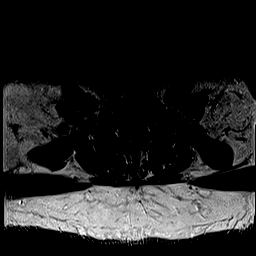
[im 13/29]
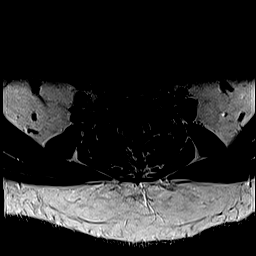
[im 17/29]
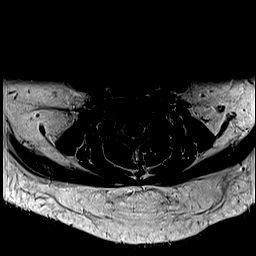
[im 21/29]
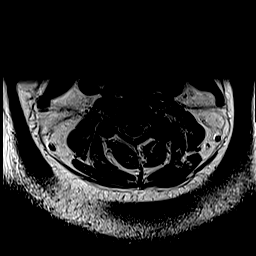
[im 25/29]
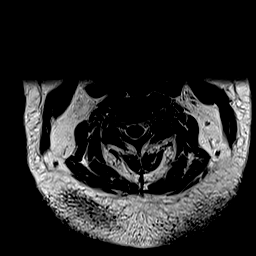
[im 29/29]
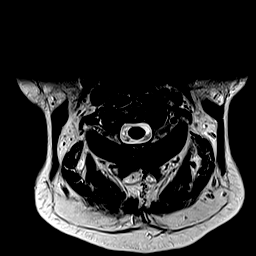

[Series 14: T1 · axial · non-contrast · 3.0mm · 0.35mm/px · z∈[-242,-150]mm · 8 of 29 slices shown]
[im 1/29]
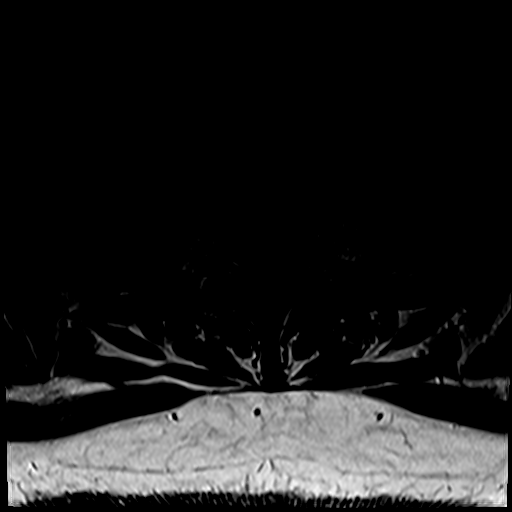
[im 5/29]
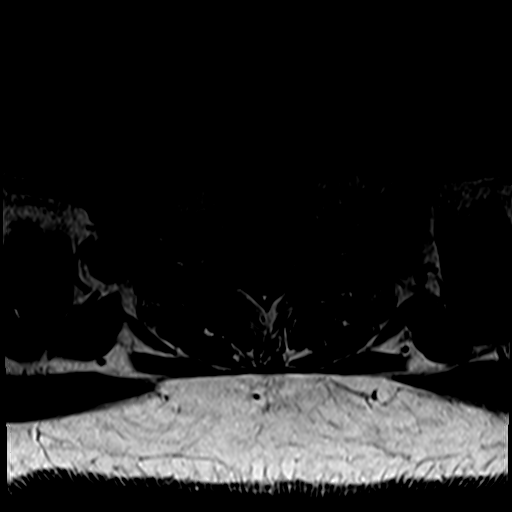
[im 9/29]
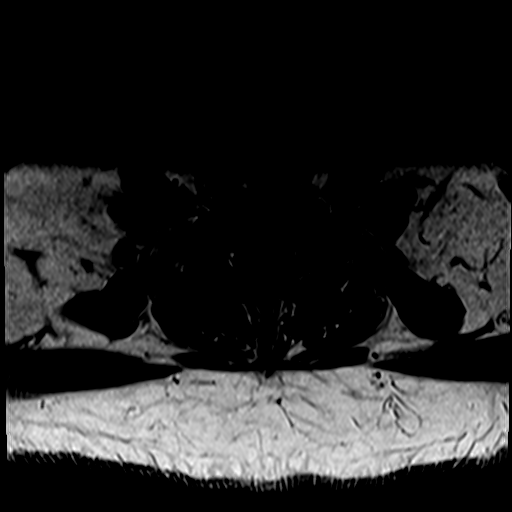
[im 13/29]
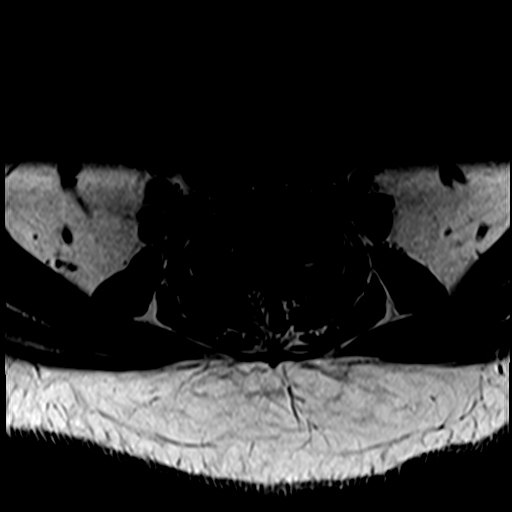
[im 17/29]
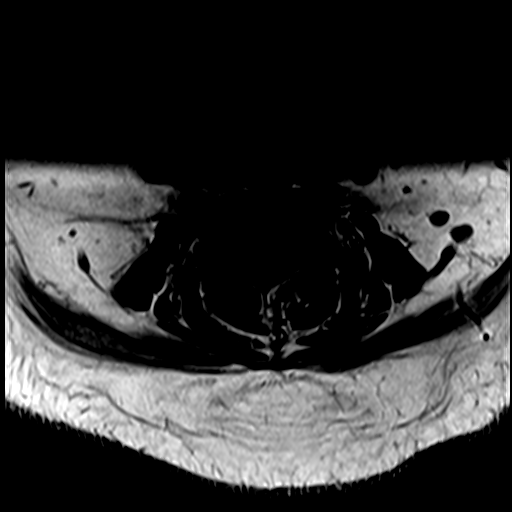
[im 21/29]
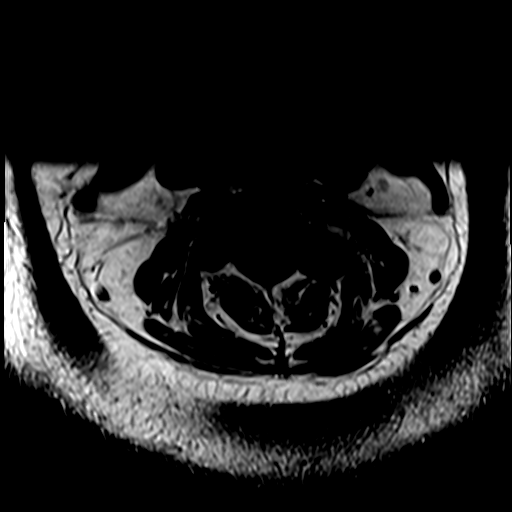
[im 25/29]
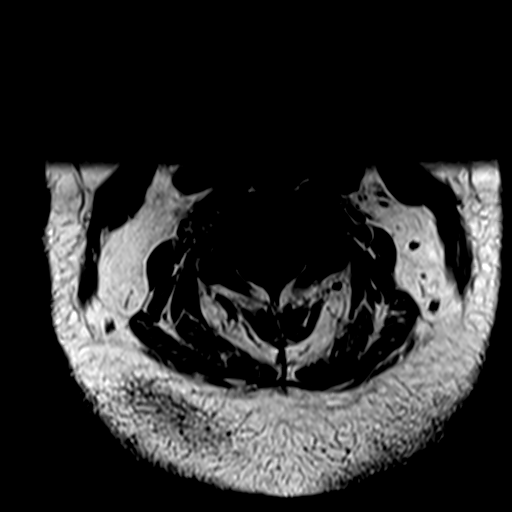
[im 29/29]
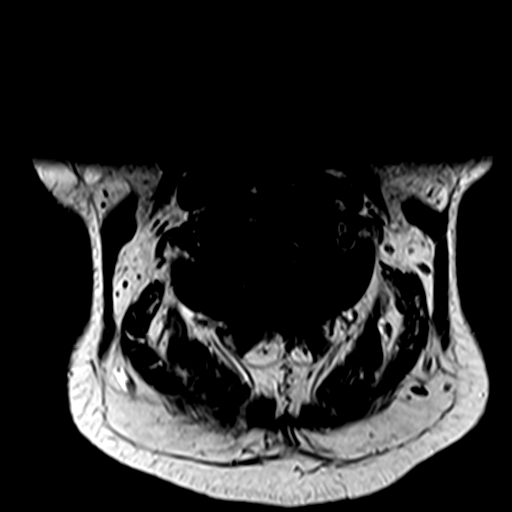

[Series 16: T1 post-contrast · axial · 3.0mm · 0.35mm/px · z∈[-242,-190]mm · 5 of 29 slices shown]
[im 1/29]
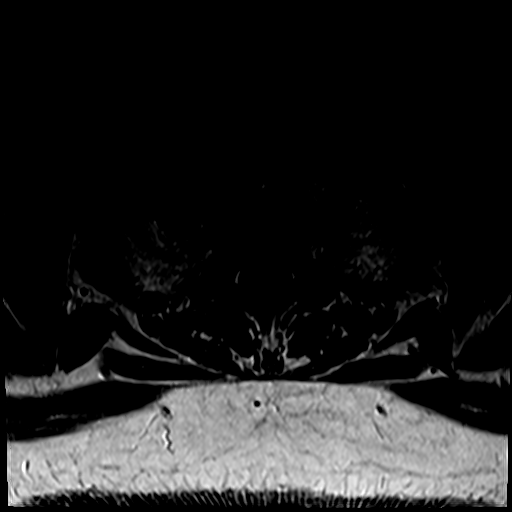
[im 5/29]
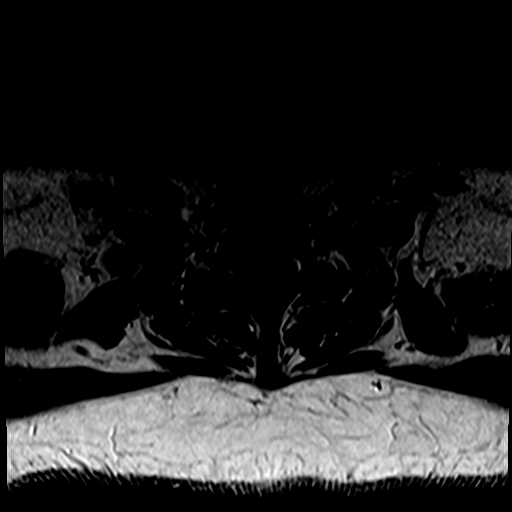
[im 9/29]
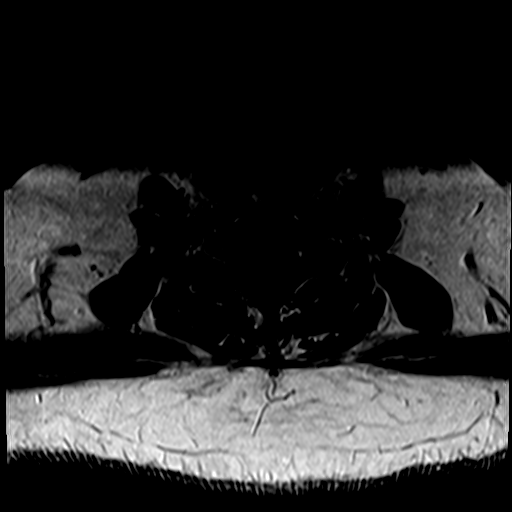
[im 13/29]
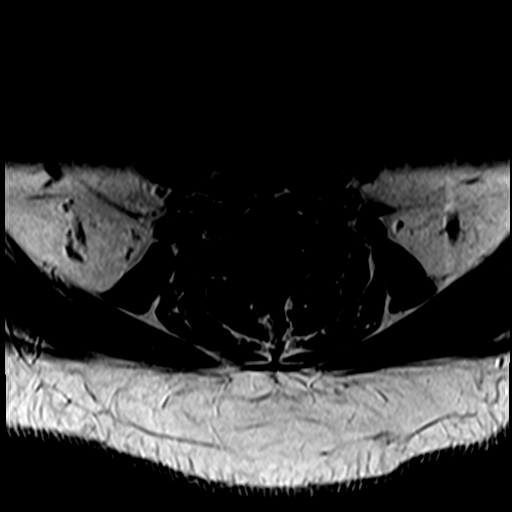
[im 17/29]
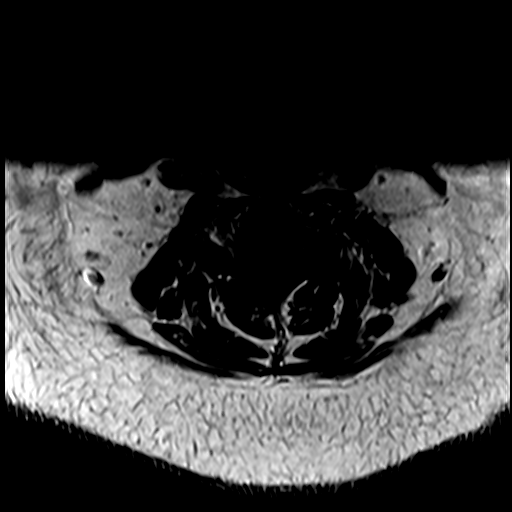

[29 of 48 positions shown; findings below may reference images not displayed]

FINDINGS: MRI CERVICAL SPINE FINDINGS

Alignment: Physiologic.

Vertebrae: No fracture, evidence of discitis, or bone lesion.

Cord: Normal signal and morphology. No abnormal contrast
enhancement.

Posterior Fossa, vertebral arteries, paraspinal tissues: Enlarged
left thyroid lobe, incompletely visualized.

Disc levels:

C1-2: Unremarkable.

C2-3: Normal disc space and facet joints. There is no spinal canal
stenosis. No neural foraminal stenosis.

C3-4: Large left foraminal uncovertebral osteophyte. There is no
spinal canal stenosis. Severe left neural foraminal stenosis.

C4-5: Normal disc space and facet joints. There is no spinal canal
stenosis. No neural foraminal stenosis.

C5-6: Normal disc space and facet joints. There is no spinal canal
stenosis. No neural foraminal stenosis.

C6-7: Small disc bulge. There is no spinal canal stenosis. No neural
foraminal stenosis.

C7-T1: Normal disc space and facet joints. There is no spinal canal
stenosis. No neural foraminal stenosis.

MRI THORACIC SPINE FINDINGS

Alignment:  Physiologic.

Vertebrae: No fracture, evidence of discitis, or bone lesion.

Cord:  Normal signal and morphology.

Paraspinal and other soft tissues: Negative.

Disc levels:

No spinal canal stenosis.  Small central disc protrusion at T8-9.
IMPRESSION: 1. No evidence of demyelinating disease of the cervical or thoracic
spine.
2. Severe left C3-4 neural foraminal stenosis secondary to
uncovertebral osteophyte.
3. Enlarged left thyroid lobe, incompletely visualized. Outpatient
thyroid ultrasound recommended.

## 2022-04-02 MED ORDER — LORAZEPAM 2 MG/ML IJ SOLN
1.0000 mg | INTRAMUSCULAR | Status: DC | PRN
  Administered 2022-04-02: 1 mg via INTRAVENOUS
  Filled 2022-04-02: qty 1

## 2022-04-02 MED ORDER — PROCHLORPERAZINE EDISYLATE 10 MG/2ML IJ SOLN
10.0000 mg | Freq: Once | INTRAMUSCULAR | Status: DC
  Filled 2022-04-02: qty 2

## 2022-04-02 MED ORDER — SODIUM CHLORIDE 0.9 % IV SOLN
1000.0000 mg | Freq: Once | INTRAVENOUS | Status: DC
  Filled 2022-04-02: qty 16

## 2022-04-02 MED ORDER — KETOROLAC TROMETHAMINE 30 MG/ML IJ SOLN
15.0000 mg | Freq: Once | INTRAMUSCULAR | Status: AC
  Administered 2022-04-02: 15 mg via INTRAVENOUS
  Filled 2022-04-02: qty 1

## 2022-04-02 MED ORDER — GADOBUTROL 1 MMOL/ML IV SOLN
10.0000 mL | Freq: Once | INTRAVENOUS | Status: AC | PRN
  Administered 2022-04-02: 10 mL via INTRAVENOUS

## 2022-04-02 MED ORDER — DIPHENHYDRAMINE HCL 50 MG/ML IJ SOLN
25.0000 mg | Freq: Once | INTRAMUSCULAR | Status: DC
  Filled 2022-04-02: qty 1

## 2022-04-02 MED ORDER — LORAZEPAM 1 MG PO TABS
1.0000 mg | ORAL_TABLET | Freq: Once | ORAL | Status: AC
  Administered 2022-04-02: 1 mg via ORAL
  Filled 2022-04-02: qty 1

## 2022-04-02 MED ORDER — POTASSIUM CHLORIDE CRYS ER 20 MEQ PO TBCR
40.0000 meq | EXTENDED_RELEASE_TABLET | Freq: Once | ORAL | Status: AC
  Administered 2022-04-02: 40 meq via ORAL
  Filled 2022-04-02: qty 2

## 2022-04-02 MED ORDER — SODIUM CHLORIDE 0.9 % IV BOLUS
1000.0000 mL | Freq: Once | INTRAVENOUS | Status: AC
  Administered 2022-04-02: 1000 mL via INTRAVENOUS

## 2022-04-02 NOTE — ED Provider Notes (Signed)
? ?[facility] ?Provider Note ? ? ? Event Date/Time  ? First MD Initiated Contact with Patient 04/02/22 1224   ?  (approximate) ? ? ?History  ? ?Headache ? ? ?HPI ? ?Ruth Woods is a 56 y.o. female who presents to the ED for evaluation of Headache ?  ?Obese patient who self-reports a history of lupus on Plaquenil. ? ?Patient presents to the ED for evaluation of 7-8 days of a novel headache and left eye blurriness.  She reports pressure to the left side of her head and feeling like she has new blurry vision to her left eye.  Reports occasionally feeling off balance and that her gait is different.  Denies difficulty texting or using her fingers or hands. ? ?Reports seeing an ophthalmologist this past Wednesday with "no eye problems." ?She reached out to her PCP to have a CT scan of the head performed, but her PCP was out of town.  So she presents to the ED for evaluation. ? ?Denies any fevers, falls or syncopal episodes, chest pain, shortness of breath, emesis or abdominal pain. ? ? ?Physical Exam  ? ?Triage Vital Signs: ?ED Triage Vitals  ?Enc Vitals Group  ?   BP 04/02/22 1144 137/89  ?   Pulse Rate 04/02/22 1144 81  ?   Resp 04/02/22 1144 16  ?   Temp 04/02/22 1144 97.9 ?F (36.6 ?C)  ?   Temp Source 04/02/22 1144 Oral  ?   SpO2 04/02/22 1144 97 %  ?   Weight 04/02/22 1139 262 lb (118.8 kg)  ?   Height 04/02/22 1139 5\' 7"  (1.702 m)  ?   Head Circumference --   ?   Peak Flow --   ?   Pain Score 04/02/22 1139 7  ?   Pain Loc --   ?   Pain Edu? --   ?   Excl. in GC? --   ? ? ?Most recent vital signs: ?Vitals:  ? 04/02/22 1144 04/02/22 1515  ?BP: 137/89 130/88  ?Pulse: 81 78  ?Resp: 16 16  ?Temp: 97.9 ?F (36.6 ?C)   ?SpO2: 97% 98%  ? ? ?General: Awake, no distress.  ?CV:  Good peripheral perfusion.  ?Resp:  Normal effort.  ?Abd:  No distention.  ?MSK:  No deformity noted.  ?Neuro:  No focal deficits appreciated.  Unable to appreciate any visual field cuts or deficits on field confrontation.  Cranial nerves II through XII intact ?5/5 strength and sensation in all 4 extremities ?Other:   ? ? ?ED Results / Procedures / Treatments  ? ?Labs ?(all labs ordered are listed, but only abnormal results are displayed) ?Labs Reviewed  ?CBC WITH DIFFERENTIAL/PLATELET - Abnormal; Notable for the following components:  ?    Result Value  ? RBC 5.19 (*)   ? Neutro Abs 8.2 (*)   ? Abs Immature Granulocytes 0.08 (*)   ? All other components within normal limits  ?COMPREHENSIVE METABOLIC PANEL - Abnormal; Notable for the following components:  ? Glucose, Bld 136 (*)   ? All other components within normal limits  ?TROPONIN I (HIGH SENSITIVITY)  ?TROPONIN I (HIGH SENSITIVITY)  ? ? ?EKG ? ? ?RADIOLOGY ?CT head reviewed by me without evidence of acute intracranial pathology ? ?Official radiology report(s): ?CT HEAD WO CONTRAST (04/04/22) ? ?Result Date: 04/02/2022 ?CLINICAL DATA:  56 year old female with headache EXAM: CT HEAD WITHOUT CONTRAST TECHNIQUE: Contiguous axial images were obtained from the base of the skull through the vertex  without intravenous contrast. RADIATION DOSE REDUCTION: This exam was performed according to the departmental dose-optimization program which includes automated exposure control, adjustment of the mA and/or kV according to patient size and/or use of iterative reconstruction technique. COMPARISON:  None FINDINGS: Brain: No acute intracranial hemorrhage. No midline shift or mass effect. Gray-white differentiation maintained. Unremarkable appearance of the ventricular system. Vascular: Unremarkable. Skull: No acute fracture.  No aggressive bone lesion identified. Sinuses/Orbits: Unremarkable appearance of the orbits. Mastoid air cells clear. No middle ear effusion. No significant sinus disease. Other: None IMPRESSION: Negative head CT Electronically Signed   By: Gilmer Mor D.O.   On: 04/02/2022 12:25  ? ?MR BRAIN WO CONTRAST ? ?Result Date: 04/02/2022 ?CLINICAL DATA:  Left eye blurriness, dizziness  EXAM: MRI HEAD WITHOUT CONTRAST TECHNIQUE: Multiplanar, multiecho pulse sequences of the brain and surrounding structures were obtained without intravenous contrast. COMPARISON:  None Available. FINDINGS: Brain: There is no acute infarction or intracranial hemorrhage. There is no intracranial mass, mass effect, or edema. Scattered foci of T2 hyperintensity in the supratentorial periventricular and subcortical/juxtacortical white matter. There is notable involvement of the white matter adjacent to the right temporal horn and pericallosal white matter. No definite involvement of the brainstem or cerebellum. Ventricles and sulci are normal in size and configuration. There is no hydrocephalus or extra-axial fluid collection. Vascular: Major vessel flow voids at the skull base are preserved. Skull and upper cervical spine: Normal marrow signal is preserved. Sinuses/Orbits: Paranasal sinuses are aerated. Orbits are unremarkable. Other: Sella is unremarkable.  Mastoid air cells are clear. IMPRESSION: No acute infarction, hemorrhage, or mass. Nonspecific foci of abnormal signal in the cerebral white matter. Although these could be related to reported history of lupus, consider evaluation for a primary demyelinating disease such as multiple sclerosis if not already performed. Electronically Signed   By: Guadlupe Spanish M.D.   On: 04/02/2022 14:30   ? ?PROCEDURES and INTERVENTIONS: ? ?Procedures ? ?Medications  ?prochlorperazine (COMPAZINE) injection 10 mg (has no administration in time range)  ?diphenhydrAMINE (BENADRYL) injection 25 mg (has no administration in time range)  ?ketorolac (TORADOL) 30 MG/ML injection 15 mg (has no administration in time range)  ?LORazepam (ATIVAN) tablet 1 mg (1 mg Oral Given 04/02/22 1349)  ?potassium chloride SA (KLOR-CON M) CR tablet 40 mEq (40 mEq Oral Given 04/02/22 1301)  ? ? ? ?IMPRESSION / MDM / ASSESSMENT AND PLAN / ED COURSE  ?I reviewed the triage vital signs and the nursing  notes. ? ?56 year old female with history of lupus presents to the ED with headache and concerns for blurry vision in the left eye of the past week.  I am unable to elicit any neurologic deficits on exam to suggest stroke.  See no evidence of trauma or injuries.  CBC and CMP are normal.  Troponin is negative.  CT head without evidence of ICH.  MRI brain without CVA, but does question a lesion that may represent MS considering her history of lupus.  I therefore consult with neurology who evaluated the patient and recommends MRI of the brain, orbit, C and T-spine.  Patient signed out on provider to follow-up on the study. ? ?Clinical Course as of 04/02/22 1541  ?Mon Apr 02, 2022  ?1514 MRI brain noted and neuro paged [DS]  ?1520 I consult with Dr. Amada Jupiter. He will come see the patient in consultation [DS]  ?1540 He has seen the patient and provides recs. MRIs ordered. Pt signed out to oncoming physician [DS]  ?  ?  Clinical Course User Index ?[DS] Delton Prairie, MD  ? ? ? ?FINAL CLINICAL IMPRESSION(S) / ED DIAGNOSES  ? ?Final diagnoses:  ?Bad headache  ?Blurry vision  ? ? ? ?Rx / DC Orders  ? ?ED Discharge Orders   ? ? None  ? ?  ? ? ? ?Note:  This document was prepared using Dragon voice recognition software and may include unintentional dictation errors. ?  ?Delton Prairie, MD ?04/02/22 1542 ? ?

## 2022-04-02 NOTE — Consult Note (Signed)
Neurology Consultation ?Reason for Consult: Blurry vision ?Referring Physician: Larinda Buttery, C ? ?CC: Blurry vision ? ?History is obtained from: Patient ? ?HPI: Ruth Woods is a 56 y.o. female with a history of lupus who presents with slightly blurred vision out of her left eye.  She states that for the past week, she has had some pain located behind her left eye and her vision has been blurry.  She denies any similar symptoms in the past.  With closing her right eye, her vision is all blurry, and with closing her left eye her vision is all clear.  She saw an ophthalmologist who stated that her eyes looked "perfect."  She came to the emergency department for further evaluation.  There an MRI was performed which demonstrated some nonspecific subcortical T2 changes. ? ? ? ?ROS: A 14 point ROS was performed and is negative except as noted in the HPI.  ? ?Past Medical History:  ?Diagnosis Date  ? Collagen vascular disease (HCC)   ? Lupus (HCC)   ? ? ? ?No family history on file. ? ? ?Social History:  reports that she has never smoked. She has never used smokeless tobacco. She reports current alcohol use. She reports that she does not currently use drugs. ? ? ?Exam: ?Current vital signs: ?BP 130/88 (BP Location: Left Arm)   Pulse 78   Temp 97.9 ?F (36.6 ?C) (Oral)   Resp 16   Ht 5\' 7"  (1.702 m)   Wt 118.8 kg   SpO2 98%   BMI 41.04 kg/m?  ?Vital signs in last 24 hours: ?Temp:  [97.9 ?F (36.6 ?C)] 97.9 ?F (36.6 ?C) (05/15 1144) ?Pulse Rate:  [78-81] 78 (05/15 1515) ?Resp:  [16] 16 (05/15 1515) ?BP: (130-137)/(88-89) 130/88 (05/15 1515) ?SpO2:  [97 %-98 %] 98 % (05/15 1515) ?Weight:  [118.8 kg] 118.8 kg (05/15 1139) ? ? ?Physical Exam  ?Constitutional: Appears well-developed and well-nourished.  ?Psych: Affect appropriate to situation ?Eyes: No scleral injection ?HENT: No OP obstruction ?MSK: no joint deformities.  ?Cardiovascular: Normal rate and regular rhythm.  ?Respiratory: Effort normal, non-labored  breathing ?GI: Soft.  No distension. There is no tenderness.  ?Skin: WDI ? ?Neuro: ?Mental Status: ?Patient is awake, alert, oriented to person, place, month, year, and situation. ?Patient is able to give a clear and coherent history. ?No signs of aphasia or neglect ?Cranial Nerves: ?II: Visual Fields are full. Pupils are equal, round, and reactive to light.  There is no definite APD ?III,IV, VI: EOMI without ptosis or diploplia.  ?V: Facial sensation is symmetric to temperature ?VII: Facial movement with left facial weakness (patient states longstanding) ?VIII: hearing is intact to voice ?X: Uvula elevates symmetrically ?XI: Shoulder shrug is symmetric. ?XII: tongue is midline without atrophy or fasciculations.  ?Motor: ?Tone is normal. Bulk is normal. 5/5 strength was present in all four extremities.  ?Sensory: ?Sensation is symmetric to light touch and temperature in the arms and legs. ?Cerebellar: ?FNF and HKS are intact bilaterally ? ? ? ? ?I have reviewed labs in epic and the results pertinent to this consultation are: ?CMP is unremarkable with a creatinine of 0.68 ? ?I have reviewed the images obtained: MRI brain-nonspecific T2 changes ? ?Impression: 56 year old female with left eye discomfort and blurry vision.  There is nothing definite on physical exam to support a diagnosis of optic neuritis, but the history is concerning for such.  I would favor further imaging with MRI of her brain and orbits with contrast.  Given her mild  symptoms, I will also get an imaging of her spinal cord as if there are T2 lesions, then I think the diagnosis of MS becomes much more likely. ? ?Recommendations: ?1) MRI brain, orbits, C and T-spine ?2) if there are any concerning findings for optic neuritis, then she will need to be admitted for IV Solu-Medrol 1 g daily ? ? ?Ritta Slot, MD ?[facility] ?[phone] ? ?If 7pm- 7am, please page neurology on call as listed in AMION. ? ?

## 2022-04-02 NOTE — ED Provider Notes (Signed)
----------------------------------------- ?  3:34 PM on 04/02/2022 ?----------------------------------------- ? ?Blood pressure 130/88, pulse 78, temperature 97.9 ?F (36.6 ?C), temperature source Oral, resp. rate 16, height 5\' 7"  (1.702 m), weight 118.8 kg, SpO2 98 %. ? ?Assuming care from Dr. .  In short, Ruth Woods is a 56 y.o. female with a chief complaint of Headache ?58  Refer to the original H&P for additional details. ? ?The current plan of care is to follow-up MRI with and without contrast or brain, orbits, cervical and thoracic spine to assess for possible MS and optic neuritis. ? ?----------------------------------------- ?11:48 PM on 04/02/2022 ?----------------------------------------- ?MRI of orbits is concerning for inflammatory changes near the left optic nerve, consistent with optic neuritis.  Additionally, patient has abnormality on MRI brain that is concerning for active demyelinating lesion.  MRI of cervical and thoracic spine are unremarkable.  We will start patient on high-dose IV Solu-Medrol and case discussed with hospitalist for admission. ? ?  ?04/04/2022, MD ?04/02/22 2348 ? ?

## 2022-04-02 NOTE — ED Triage Notes (Signed)
Pt to ED via POV stating that for the past week she has been having pain in her head. Pt states that she is also having visual changes in her left eye. Pt states that she was seen by her PCP who told her she was concerned that she may had a stroke. PCP was supposed to order a CT of the head but when pt called this morning, her PCP had left to go out of town. Pt states that she was also seen by her eye doctor who told her that her eyes were ok. Pt states that the pain seems to be getting worse. Pt is currently in NAD.  ?

## 2022-04-02 NOTE — ED Provider Triage Note (Signed)
Emergency Medicine Provider Triage Evaluation Note ? ?Ruth Woods , a 56 y.o. female  was evaluated in triage.  Pt complains of headache, blurred vision.  Symptoms x1 week.  Saw her PCP and eye doctor.  Patient states feels like a lot of pressure behind her eye.  Is taken a steroid Dosepak.  Patient has history of lupus. ? ?Review of Systems  ?Positive: Headache, blurred vision ?Negative: Fever, chills ? ?Physical Exam  ?BP 137/89 (BP Location: Left Arm)   Pulse 81   Temp 97.9 ?F (36.6 ?C) (Oral)   Resp 16   Ht 5\' 7"  (1.702 m)   Wt 118.8 kg   SpO2 97%   BMI 41.04 kg/m?  ?Gen:   Awake, no distress   ?Resp:  Normal effort  ?MSK:   Moves extremities without difficulty  ?Other:   ? ?Medical Decision Making  ?Medically screening exam initiated at 11:48 AM.  Appropriate orders placed.  Victoriana Aziz was informed that the remainder of the evaluation will be completed by another provider, this initial triage assessment does not replace that evaluation, and the importance of remaining in the ED until their evaluation is complete. ? ? ?  ?Georgina Peer, PA-C ?04/02/22 1149 ? ?

## 2022-04-03 ENCOUNTER — Encounter: Payer: Self-pay | Admitting: Internal Medicine

## 2022-04-03 DIAGNOSIS — H469 Unspecified optic neuritis: Secondary | ICD-10-CM

## 2022-04-03 DIAGNOSIS — Z79899 Other long term (current) drug therapy: Secondary | ICD-10-CM | POA: Diagnosis not present

## 2022-04-03 DIAGNOSIS — L930 Discoid lupus erythematosus: Secondary | ICD-10-CM

## 2022-04-03 DIAGNOSIS — M159 Polyosteoarthritis, unspecified: Secondary | ICD-10-CM | POA: Diagnosis present

## 2022-04-03 DIAGNOSIS — Z90710 Acquired absence of both cervix and uterus: Secondary | ICD-10-CM | POA: Diagnosis not present

## 2022-04-03 DIAGNOSIS — Z6841 Body Mass Index (BMI) 40.0 and over, adult: Secondary | ICD-10-CM | POA: Diagnosis not present

## 2022-04-03 DIAGNOSIS — R519 Headache, unspecified: Secondary | ICD-10-CM | POA: Diagnosis present

## 2022-04-03 LAB — CBC
HCT: 38.1 % (ref 36.0–46.0)
Hemoglobin: 12.2 g/dL (ref 12.0–15.0)
MCH: 26.3 pg (ref 26.0–34.0)
MCHC: 32 g/dL (ref 30.0–36.0)
MCV: 82.1 fL (ref 80.0–100.0)
Platelets: 279 10*3/uL (ref 150–400)
RBC: 4.64 MIL/uL (ref 3.87–5.11)
RDW: 14.6 % (ref 11.5–15.5)
WBC: 8.9 10*3/uL (ref 4.0–10.5)
nRBC: 0 % (ref 0.0–0.2)

## 2022-04-03 LAB — HIV ANTIBODY (ROUTINE TESTING W REFLEX): HIV Screen 4th Generation wRfx: NONREACTIVE

## 2022-04-03 LAB — CREATININE, SERUM
Creatinine, Ser: 0.52 mg/dL (ref 0.44–1.00)
GFR, Estimated: >60 mL/min (ref >60)

## 2022-04-03 MED ORDER — ONDANSETRON HCL 4 MG/2ML IJ SOLN: 4.0000 mg | Freq: Four times a day (QID) | INTRAMUSCULAR | Status: DC | PRN

## 2022-04-03 MED ORDER — SODIUM CHLORIDE 0.9 % IV SOLN
1000.0000 mg | INTRAVENOUS | Status: DC
  Administered 2022-04-03: 1000 mg via INTRAVENOUS
  Filled 2022-04-03 (×2): qty 16

## 2022-04-03 MED ORDER — ONDANSETRON HCL 4 MG PO TABS: 4.0000 mg | ORAL_TABLET | Freq: Four times a day (QID) | ORAL | Status: DC | PRN

## 2022-04-03 MED ORDER — ACETAMINOPHEN 650 MG RE SUPP: 650.0000 mg | Freq: Four times a day (QID) | RECTAL | Status: DC | PRN

## 2022-04-03 MED ORDER — ENOXAPARIN SODIUM 60 MG/0.6ML IJ SOSY
0.5000 mg/kg | PREFILLED_SYRINGE | INTRAMUSCULAR | Status: DC
  Administered 2022-04-03: 60 mg via SUBCUTANEOUS
  Filled 2022-04-03 (×2): qty 0.6

## 2022-04-03 MED ORDER — ACETAMINOPHEN 325 MG PO TABS
650.0000 mg | ORAL_TABLET | Freq: Four times a day (QID) | ORAL | Status: DC | PRN
  Administered 2022-04-03 – 2022-04-04 (×3): 650 mg via ORAL
  Filled 2022-04-03 (×3): qty 2

## 2022-04-03 MED ORDER — SODIUM CHLORIDE 0.9 % IV SOLN: 1000.0000 mg | INTRAVENOUS | Status: DC

## 2022-04-03 NOTE — Progress Notes (Signed)
Interval events noted.  She complains of blurry vision in the left eye.  No other complaints.  She feels a little better today.  Continue high-dose IV steroids.  Plan discussed with the patient and her husband at the bedside. ?

## 2022-04-03 NOTE — Assessment & Plan Note (Signed)
Currently pain controlled ?

## 2022-04-03 NOTE — Assessment & Plan Note (Signed)
IV Solu-Medrol, 1 g daily for 3 days ?Neurology to follow ?

## 2022-04-03 NOTE — H&P (Signed)
?History and Physical  ? ? ?Patient: Ruth Woods [id number] DOB: Dec 07, 1965 ?DOA: 04/02/2022 ?DOS: the patient was seen and examined on 04/03/2022 ?PCP: Rexene Agent, MD  ?Patient coming from: Home ? ?Chief Complaint:  ?Chief Complaint  ?Patient presents with  ? Headache  ? ? ?HPI: Ruth Woods is a 56 y.o. 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  She was seen by an ophthalmologist but no abnormalities were found but due to persistent symptoms she decided to come to the emergency room. ?ED course and data review: Vitals within normal limits.  CBC, CMP and troponin unremarkable.  EKG, personally viewed and interpreted: Sinus at 76 with no notable abnormalities. ?CT head was negative.  The ED provider was concerned about optic neuritis and consulted neurologist, Dr. Amada Jupiter who saw patient in consultation and recommended MRI brain, orbits, C and T-spine ?MRI orbits resulted as follows: ?IMPRESSION: ?Abnormal contrast enhancement of the proximal intraorbital left ?optic nerve, consistent with optic neuritis. ?  ? ?Neurology recommended starting patient on IV Solu-Medrol 1 g daily x3 days for treatment of optic neuritis.  Hospitalist consulted for admission.  ? ?Review of Systems: As mentioned in the history of present illness. All other systems reviewed and are negative. ?Past Medical History:  ?Diagnosis Date  ? Collagen vascular disease (HCC)   ? Lupus (HCC)   ? ?Past Surgical History:  ?Procedure Laterality Date  ? ABDOMINAL HYSTERECTOMY    ? CHOLECYSTECTOMY    ? ?Social History:  reports that she has never smoked. She has never used smokeless tobacco. She reports current alcohol use. She reports that she does not currently use drugs. ? ?Allergies   ?Allergen Reactions  ? Levaquin [Levofloxacin In D5w]   ? Penicillins   ? ? ?No family history on file. ? ?Prior to Admission medications   ?Medication Sig Start Date End Date Taking? Authorizing Provider  ?ondansetron (ZOFRAN ODT) 4 MG disintegrating tablet Take 1 tablet (4 mg total) by mouth every 8 (eight) hours as needed for nausea or vomiting. 07/18/18   Irean Hong, MD  ?permethrin (ELIMITE) 5 % cream Thoroughly massage cream (using about half the tube) from head to soles of feet; leave on for 8 to 14 hours before removing (shower or bath); 02/03/19   Sharman Cheek, MD  ?predniSONE (STERAPRED UNI-PAK 21 TAB) 10 MG (21) TBPK tablet 6 tablets on day 1, then ?5 tablets on day 2, then ?4 tablets on day 3, then ?3 tablets on day 4, then ?2 tablets on day 5, then ?1 tablet on day 6. 02/03/19   Sharman Cheek, MD  ? ? ?Physical Exam: ?Vitals:  ? 04/02/22 1139 04/02/22 1144 04/02/22 1515 04/03/22 0011  ?BP:  137/89 130/88 135/73  ?Pulse:  81 78 76  ?Resp:  16 16 18   ?Temp:  97.9 ?F (36.6 ?C)  (!) 97.5 ?F (36.4 ?C)  ?TempSrc:  Oral  Oral  ?SpO2:  97% 98% 98%  ?Weight: 118.8 kg     ?Height: 5\' 7"  (1.702 m)     ? ?Physical Exam ?Vitals and nursing note reviewed.  ?Constitutional:   ?   General: She is not in acute distress. ?HENT:  ?   Head: Normocephalic and atraumatic.  ?Cardiovascular:  ?   Rate and Rhythm: Normal  rate and regular rhythm.  ?   Heart sounds: Normal heart sounds.  ?Pulmonary:  ?   Effort: Pulmonary effort is normal.  ?   Breath sounds: Normal breath sounds.  ?Abdominal:  ?   Palpations: Abdomen is soft.  ?   Tenderness: There is no abdominal tenderness.  ?Neurological:  ?   Mental Status: Mental status is at baseline.  ? ? ? ?Data Reviewed: ?Relevant notes from primary care and specialist visits, past discharge summaries as available in EHR, including Care Everywhere. ?Prior diagnostic testing as pertinent to current admission diagnoses ?Updated medications and problem lists for reconciliation ?ED  course, including vitals, labs, imaging, treatment and response to treatment ?Triage notes, nursing and pharmacy notes and ED provider's notes ?Notable results as noted in HPI ? ? ?Assessment and Plan: ?* Optic neuritis ?IV Solu-Medrol, 1 g daily for 3 days ?Neurology to follow ? ?Discoid lupus 2010 ?Continue Plaquenil.  Followed by rheumatology but last visit Per chart review was in 2020 ? ?Primary osteoarthritis involving multiple joints ?Currently pain controlled ? ?Obesity, Class III, BMI 40-49.9 (morbid obesity) (HCC) ?Complicating factor to overall prognosis ? ? ? ? ? ? ?Advance Care Planning:   Code Status: Not on file  ? ?Consults: Neurology Dr. Amada Jupiter ? ?Family Communication: none ? ?Severity of Illness: ?The appropriate patient status for this patient is INPATIENT. Inpatient status is judged to be reasonable and necessary in order to provide the required intensity of service to ensure the patient's safety. The patient's presenting symptoms, physical exam findings, and initial radiographic and laboratory data in the context of their chronic comorbidities is felt to place them at high risk for further clinical deterioration. Furthermore, it is not anticipated that the patient will be medically stable for discharge from the hospital within 2 midnights of admission.  ? ?* I certify that at the point of admission it is my clinical judgment that the patient will require inpatient hospital care spanning beyond 2 midnights from the point of admission due to high intensity of service, high risk for further deterioration and high frequency of surveillance required.* ? ?Author: ?Andris Baumann, MD ?04/03/2022 12:47 AM ? ?For on call review [email or web address].  ?

## 2022-04-03 NOTE — Assessment & Plan Note (Signed)
Continue Plaquenil.  Followed by rheumatology but last visit Per chart review was in 2020 ?

## 2022-04-03 NOTE — Progress Notes (Signed)
PHARMACIST - PHYSICIAN COMMUNICATION ? ?CONCERNING:  Enoxaparin (Lovenox) for DVT Prophylaxis  ? ? ?RECOMMENDATION: ?Patient was prescribed enoxaprin 40mg  q24 hours for VTE prophylaxis.  ? ?Filed Weights  ? 04/02/22 1139  ?Weight: 118.8 kg (262 lb)  ? ? ?Body mass index is 41.04 kg/m?. ? ?Estimated Creatinine Clearance: 104.7 mL/min (by C-G formula based on SCr of 0.68 mg/dL). ? ? ?Based on [facility] policy patient is candidate for enoxaparin 0.5mg /kg TBW SQ every 24 hours based on BMI being >30. ? ?DESCRIPTION: ?Pharmacy has adjusted enoxaparin dose per [facility] policy. ? ?Patient is now receiving enoxaparin 0.5 mg/kg every 24 hours  ? ?[facility], PharmD, MBA ?04/03/2022 ?12:52 AM ? ?

## 2022-04-03 NOTE — ED Notes (Signed)
Transport called to take pt up to her room ?

## 2022-04-03 NOTE — Progress Notes (Addendum)
Subjective: ?Feels her eye pressure  is slightly better ? ?Exam: ?Vitals:  ? 04/03/22 0625 04/03/22 0850  ?BP: 121/64 131/82  ?Pulse: 73 81  ?Resp: 16 16  ?Temp: (!) 97.5 ?F (36.4 ?C) 98.2 ?F (36.8 ?C)  ?SpO2: 98% 96%  ? ?Gen: In bed, NAD ?Resp: non-labored breathing, no acute distress ?Abd: soft, nt ? ?Neuro: ?MS: awake, alert.  ?CN: she has reative eyes bilaterally, no definite apd ?Motor: no drift ? ?MRI reviewed- no spinal lesions,  ? ?Impression: 56 yo F with a history of SLE(per patient) who presents with signs/symptoms consistent with optic neuritis with MRI evidence of an enhancing lesion. She does have T2 lesions on MRI which could be concerning for MS. SLE can cause changes as well, but now with optic neuritis, my suspicion is for MS.  ? ?Recommendations: ?1) Solumedrol 1g daily x 5 days ?2) this does not need to be done as inpatient, if IV infusions could be arranged as outpatient.  ?3) I have requested follow up with Dr. Epimenio Foot of [facility].  ? ?Ritta Slot, MD ?[facility] ?[phone] ? ?If 7pm- 7am, please page neurology on call as listed in AMION. ? ?

## 2022-04-03 NOTE — Assessment & Plan Note (Signed)
Complicating factor to overall prognosis ?

## 2022-04-03 NOTE — Assessment & Plan Note (Signed)
-   Continue Plaquenil 

## 2022-04-04 DIAGNOSIS — H469 Unspecified optic neuritis: Secondary | ICD-10-CM | POA: Diagnosis not present

## 2022-04-04 MED ORDER — SODIUM CHLORIDE 0.9 % IV SOLN
1000.0000 mg | INTRAVENOUS | Status: DC
  Administered 2022-04-04: 1000 mg via INTRAVENOUS
  Filled 2022-04-04: qty 16

## 2022-04-04 MED ORDER — METHYLPREDNISOLONE SODIUM SUCC 125 MG IJ SOLR: 1000.0000 mg | Freq: Every day | INTRAMUSCULAR | 0 refills | Status: AC

## 2022-04-04 NOTE — TOC Initial Note (Addendum)
Transition of Care (TOC) - Initial/Assessment Note  ? ? ?Patient Details  ?Name: Ruth Woods ?MRN: [id number] ?Date of Birth: 09/12/1966 ? ?Transition of Care (TOC) CM/SW Contact:    ?Conception Oms, RN ?Phone Number: ?04/04/2022, 11:02 AM ? ?Clinical Narrative:                 ? ? ?Transition of Care (TOC) Screening Note ? ? ?Patient Details  ?Name: Ruth Woods ?Date of Birth: [birth year] ? ? ?Transition of Care (TOC) CM/SW Contact:    ?Conception Oms, RN ?Phone Number: ?04/04/2022, 11:02 AM ? ?Patient is independent ?Outpatient IV  steroid Infusion will be arranged by Dr's office ? ?Transition of Care Department [facility]) has reviewed patient and no TOC needs have been identified at this time. We will continue to monitor patient advancement through interdisciplinary progression rounds. If new patient transition needs arise, please place a TOC consult. ?  ?  ?  ? ? ?Patient Goals and CMS Choice ?  ?  ?  ? ?Expected Discharge Plan and Services ?  ?  ?  ?  ?  ?Expected Discharge Date: 04/04/22               ?  ?  ?  ?  ?  ?  ?  ?  ?  ?  ? ?Prior Living Arrangements/Services ?  ?  ?  ?       ?  ?  ?  ?  ? ?Activities of Daily Living ?Home Assistive Devices/Equipment: Eyeglasses ?ADL Screening (condition at time of admission) ?Patient's cognitive ability adequate to safely complete daily activities?: Yes ?Is the patient deaf or have difficulty hearing?: No ?Does the patient have difficulty seeing, even when wearing glasses/contacts?: Yes ?Does the patient have difficulty concentrating, remembering, or making decisions?: No ?Patient able to express need for assistance with ADLs?: Yes ?Does the patient have difficulty dressing or bathing?: No ?Independently performs ADLs?: Yes (appropriate for developmental age) ?Does the patient have difficulty walking or climbing stairs?: No ?Weakness of Legs: None ?Weakness of Arms/Hands: None ? ?Permission Sought/Granted ?  ?  ?   ?   ?   ?   ? ?Emotional Assessment ?  ?  ?  ?   ?  ?  ? ?Admission diagnosis:  Bad headache [R51.9] ?Blurry vision [H53.8] ?Optic neuritis [H46.9] ?Patient Active Problem List  ? Diagnosis Date Noted  ? Optic neuritis 04/03/2022  ? Discoid lupus 2010   ? Primary osteoarthritis involving multiple joints 06/19/2019  ? Obesity, Class III, BMI 40-49.9 (morbid obesity) ([place]) 05/28/2019  ? ?PCP:  Eda Paschal, MD ?Pharmacy:   ?[facility] [place] [address] ?Phone: [phone] Fax: [phone] ? ? ? ? ?Social Determinants of Health (SDOH) Interventions ?  ? ?Readmission Risk Interventions ?   ? View : No data to display.  ?  ?  ?  ? ? ? ?

## 2022-04-04 NOTE — Discharge Summary (Signed)
Physician Discharge Summary   Patient: Ruth Woods MRN: [id number] DOB: 03/15/1966  Admit date:     04/02/2022  Discharge date: 04/04/2022  Discharge Physician: Lurene Shadow   PCP: Rexene Agent, MD   Recommendations at discharge:   Follow-up with PCP in 1 week Follow-up with neurologist within 1 month Go to the outpatient infusion center for IV steroids as scheduled on 5/18, 5/19 and 04/09/2022.  Discharge Diagnoses: Principal Problem:   Optic neuritis Active Problems:   Obesity, Class III, BMI 40-49.9 (morbid obesity) (HCC)   Primary osteoarthritis involving multiple joints   Discoid lupus 2010  Resolved Problems:   * No resolved hospital problems. [facility] Course: 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  Work-up revealed optic neuritis in the left eye.  She was treated with high-dose Solu-Medrol. She will continue with high-dose Solu-Medrol at the infusion center to complete a 5-day course.  She also has an enlarged left thyroid gland.  She said she was told about the enlarged thyroid gland about 2 to 3 years ago but she has not followed up on this.  She has been advised to follow-up with her PCP for further evaluation.   Her condition has improved and she is deemed stable for discharge to home.         Consultants: Neurologist Procedures performed: None Disposition: Home Diet recommendation:  Discharge Diet Orders (From admission, onward)     Start     Ordered   04/04/22 0000  Diet - low sodium heart healthy        04/04/22 0946           Cardiac diet DISCHARGE MEDICATION: Allergies as of 04/04/2022       Reactions   Levofloxacin Shortness Of Breath   Other reaction(s): Respiratory Distress   Levaquin [levofloxacin In D5w]    Penicillins Rash         Medication List     STOP taking these medications    predniSONE 10 MG tablet Commonly known as: DELTASONE       TAKE these medications    HYDROcodone-acetaminophen 5-325 MG tablet Commonly known as: NORCO/VICODIN Take 1 tablet by mouth 2 (two) times daily as needed for pain.   hydrOXYzine 25 MG tablet Commonly known as: ATARAX Take 12.5-25 mg by mouth daily as needed.   meloxicam 15 MG tablet Commonly known as: MOBIC Take 15 mg by mouth daily at 6 (six) AM.   methylPREDNISolone sodium succinate 125 mg/2 mL injection Commonly known as: SOLU-MEDROL Inject 16 mLs (1,000 mg total) into the muscle daily for 3 doses. Start taking on: Apr 05, 2022   Plaquenil 200 MG tablet Generic drug: hydroxychloroquine Take 200 mg by mouth 2 (two) times daily.        Discharge Exam: Filed Weights   04/02/22 1139  Weight: 118.8 kg   GEN: NAD SKIN: No rash EYES: EOMI ENT: MMM CV: RRR PULM: CTA B ABD: soft, obese, NT, +BS CNS: AAO x 3, non focal EXT: No edema or tenderness   Condition at discharge: good  The results of significant diagnostics from this hospitalization (including imaging, microbiology, ancillary and laboratory) are listed below for reference.   Imaging Studies: CT HEAD WO CONTRAST ( )  Result Date: 04/02/2022 CLINICAL DATA:  56 year old female with headache EXAM: CT HEAD WITHOUT CONTRAST TECHNIQUE: Contiguous  axial images were obtained from the base of the skull through the vertex without intravenous contrast. RADIATION DOSE REDUCTION: This exam was performed according to the departmental dose-optimization program which includes automated exposure control, adjustment of the mA and/or kV according to patient size and/or use of iterative reconstruction technique. COMPARISON:  None FINDINGS: Brain: No acute intracranial hemorrhage. No midline shift or mass effect. Gray-white differentiation maintained. Unremarkable appearance of the ventricular system. Vascular:  Unremarkable. Skull: No acute fracture.  No aggressive bone lesion identified. Sinuses/Orbits: Unremarkable appearance of the orbits. Mastoid air cells clear. No middle ear effusion. No significant sinus disease. Other: None IMPRESSION: Negative head CT Electronically Signed   By: Gilmer Mor D.O.   On: 04/02/2022 12:25   MR BRAIN WO CONTRAST  Result Date: 04/02/2022 CLINICAL DATA:  Left eye blurriness, dizziness EXAM: MRI HEAD WITHOUT CONTRAST TECHNIQUE: Multiplanar, multiecho pulse sequences of the brain and surrounding structures were obtained without intravenous contrast. COMPARISON:  None Available. FINDINGS: Brain: There is no acute infarction or intracranial hemorrhage. There is no intracranial mass, mass effect, or edema. Scattered foci of T2 hyperintensity in the supratentorial periventricular and subcortical/juxtacortical white matter. There is notable involvement of the white matter adjacent to the right temporal horn and pericallosal white matter. No definite involvement of the brainstem or cerebellum. Ventricles and sulci are normal in size and configuration. There is no hydrocephalus or extra-axial fluid collection. Vascular: Major vessel flow voids at the skull base are preserved. Skull and upper cervical spine: Normal marrow signal is preserved. Sinuses/Orbits: Paranasal sinuses are aerated. Orbits are unremarkable. Other: Sella is unremarkable.  Mastoid air cells are clear. IMPRESSION: No acute infarction, hemorrhage, or mass. Nonspecific foci of abnormal signal in the cerebral white matter. Although these could be related to reported history of lupus, consider evaluation for a primary demyelinating disease such as multiple sclerosis if not already performed. Electronically Signed   By: Guadlupe Spanish M.D.   On: 04/02/2022 14:30   MR BRAIN W CONTRAST  Result Date: 04/02/2022 CLINICAL DATA:  Left eye blurriness EXAM: MRI HEAD WITH CONTRAST TECHNIQUE: Multiplanar, multiecho pulse sequences  of the brain and surrounding structures were obtained with intravenous contrast. CONTRAST:  41mL GADAVIST GADOBUTROL 1 MMOL/ML IV SOLN COMPARISON:  Noncontrast brain MRI 04/02/2022 FINDINGS: There is a single focus of abnormal contrast enhancement within the left frontal white matter adjacent to the frontal horn of the lateral ventricle. This corresponds to 1 of the hyperintense T2-weighted signal lesions on the earlier noncontrast MRI. IMPRESSION: Single focus of abnormal contrast enhancement within the left frontal white matter adjacent to the frontal horn of the lateral ventricle may indicate an active demyelinating lesion. Electronically Signed   By: Deatra Robinson M.D.   On: 04/02/2022 23:28   MR CERVICAL SPINE W WO CONTRAST  Result Date: 04/02/2022 CLINICAL DATA:  Suspected demyelinating disease EXAM: MRI CERVICAL AND THORACIC SPINE WITHOUT AND WITH CONTRAST TECHNIQUE: Multiplanar and multiecho pulse sequences of the cervical spine, to include the craniocervical junction and cervicothoracic junction, and the thoracic spine, were obtained without and with intravenous contrast. CONTRAST:  52mL GADAVIST GADOBUTROL 1 MMOL/ML IV SOLN COMPARISON:  None Available. FINDINGS: MRI CERVICAL SPINE FINDINGS Alignment: Physiologic. Vertebrae: No fracture, evidence of discitis, or bone lesion. Cord: Normal signal and morphology. No abnormal contrast enhancement. Posterior Fossa, vertebral arteries, paraspinal tissues: Enlarged left thyroid lobe, incompletely visualized. Disc levels: C1-2: Unremarkable. C2-3: Normal disc space and facet joints. There is no spinal canal stenosis. No neural foraminal stenosis.  C3-4: Large left foraminal uncovertebral osteophyte. There is no spinal canal stenosis. Severe left neural foraminal stenosis. C4-5: Normal disc space and facet joints. There is no spinal canal stenosis. No neural foraminal stenosis. C5-6: Normal disc space and facet joints. There is no spinal canal stenosis. No neural  foraminal stenosis. C6-7: Small disc bulge. There is no spinal canal stenosis. No neural foraminal stenosis. C7-T1: Normal disc space and facet joints. There is no spinal canal stenosis. No neural foraminal stenosis. MRI THORACIC SPINE FINDINGS Alignment:  Physiologic. Vertebrae: No fracture, evidence of discitis, or bone lesion. Cord:  Normal signal and morphology. Paraspinal and other soft tissues: Negative. Disc levels: No spinal canal stenosis.  Small central disc protrusion at T8-9. IMPRESSION: 1. No evidence of demyelinating disease of the cervical or thoracic spine. 2. Severe left C3-4 neural foraminal stenosis secondary to uncovertebral osteophyte. 3. Enlarged left thyroid lobe, incompletely visualized. Outpatient thyroid ultrasound recommended. Electronically Signed   By: Deatra Robinson M.D.   On: 04/02/2022 23:44   MR THORACIC SPINE W WO CONTRAST  Result Date: 04/02/2022 CLINICAL DATA:  Suspected demyelinating disease EXAM: MRI CERVICAL AND THORACIC SPINE WITHOUT AND WITH CONTRAST TECHNIQUE: Multiplanar and multiecho pulse sequences of the cervical spine, to include the craniocervical junction and cervicothoracic junction, and the thoracic spine, were obtained without and with intravenous contrast. CONTRAST:  10mL GADAVIST GADOBUTROL 1 MMOL/ML IV SOLN COMPARISON:  None Available. FINDINGS: MRI CERVICAL SPINE FINDINGS Alignment: Physiologic. Vertebrae: No fracture, evidence of discitis, or bone lesion. Cord: Normal signal and morphology. No abnormal contrast enhancement. Posterior Fossa, vertebral arteries, paraspinal tissues: Enlarged left thyroid lobe, incompletely visualized. Disc levels: C1-2: Unremarkable. C2-3: Normal disc space and facet joints. There is no spinal canal stenosis. No neural foraminal stenosis. C3-4: Large left foraminal uncovertebral osteophyte. There is no spinal canal stenosis. Severe left neural foraminal stenosis. C4-5: Normal disc space and facet joints. There is no spinal  canal stenosis. No neural foraminal stenosis. C5-6: Normal disc space and facet joints. There is no spinal canal stenosis. No neural foraminal stenosis. C6-7: Small disc bulge. There is no spinal canal stenosis. No neural foraminal stenosis. C7-T1: Normal disc space and facet joints. There is no spinal canal stenosis. No neural foraminal stenosis. MRI THORACIC SPINE FINDINGS Alignment:  Physiologic. Vertebrae: No fracture, evidence of discitis, or bone lesion. Cord:  Normal signal and morphology. Paraspinal and other soft tissues: Negative. Disc levels: No spinal canal stenosis.  Small central disc protrusion at T8-9. IMPRESSION: 1. No evidence of demyelinating disease of the cervical or thoracic spine. 2. Severe left C3-4 neural foraminal stenosis secondary to uncovertebral osteophyte. 3. Enlarged left thyroid lobe, incompletely visualized. Outpatient thyroid ultrasound recommended. Electronically Signed   By: Deatra Robinson M.D.   On: 04/02/2022 23:44   MR ORBITS W WO CONTRAST  Result Date: 04/02/2022 CLINICAL DATA:  Left eye blurriness EXAM: MRI OF THE ORBITS WITHOUT AND WITH CONTRAST TECHNIQUE: Multiplanar, multi-echo pulse sequences of the orbits and surrounding structures were acquired including fat saturation techniques, before and after intravenous contrast administration. CONTRAST:  10mL GADAVIST GADOBUTROL 1 MMOL/ML IV SOLN COMPARISON:  None Available. FINDINGS: Orbits: There is abnormal contrast enhancement of the proximal intraorbital left optic nerve. The right optic nerve is normal. The other intraorbital structures are normal. Visualized sinuses: Normal Soft tissues: Normal IMPRESSION: Abnormal contrast enhancement of the proximal intraorbital left optic nerve, consistent with optic neuritis. Electronically Signed   By: Deatra Robinson M.D.   On: 04/02/2022 23:38  Microbiology: No results found for this or any previous visit.  Labs: CBC: Recent Labs  Lab 04/02/22 1246 04/03/22 0300  WBC  9.8 8.9  NEUTROABS 8.2*  --   HGB 13.5 12.2  HCT 43.1 38.1  MCV 83.0 82.1  PLT 302 279   Basic Metabolic Panel: Recent Labs  Lab 04/02/22 1246 04/03/22 0300  NA 138  --   K 4.4  --   CL 106  --   CO2 22  --   GLUCOSE 136*  --   BUN 16  --   CREATININE 0.68 0.52  CALCIUM 9.3  --    Liver Function Tests: Recent Labs  Lab 04/02/22 1246  AST 23  ALT 22  ALKPHOS 84  BILITOT 0.8  PROT 7.3  ALBUMIN 3.9   CBG: No results for input(s): GLUCAP in the last 168 hours.  Discharge time spent: greater than 30 minutes.  Signed: Lurene Shadow, MD [facility] 04/04/2022

## 2022-04-05 ENCOUNTER — Ambulatory Visit
Admit: 2022-04-05 | Discharge: 2022-04-05 | Disposition: A | Discharge status: Home / Self Care | Payer: Managed Care, Other (non HMO) | Attending: Internal Medicine | Admitting: Internal Medicine

## 2022-04-05 DIAGNOSIS — H538 Other visual disturbances: Secondary | ICD-10-CM | POA: Insufficient documentation

## 2022-04-05 DIAGNOSIS — R519 Headache, unspecified: Secondary | ICD-10-CM | POA: Insufficient documentation

## 2022-04-05 DIAGNOSIS — H469 Unspecified optic neuritis: Secondary | ICD-10-CM | POA: Diagnosis not present

## 2022-04-05 MED ORDER — SODIUM CHLORIDE FLUSH 0.9 % IV SOLN
INTRAVENOUS | Status: AC
  Filled 2022-04-05: qty 10

## 2022-04-05 MED ORDER — SODIUM CHLORIDE 0.9 % IV SOLN
1000.0000 mg | Freq: Once | INTRAVENOUS | Status: AC
  Administered 2022-04-05: 1000 mg via INTRAVENOUS
  Filled 2022-04-05: qty 16

## 2022-04-06 ENCOUNTER — Ambulatory Visit
Admit: 2022-04-06 | Discharge: 2022-04-06 | Disposition: A | Discharge status: Home / Self Care | Payer: Managed Care, Other (non HMO) | Attending: Internal Medicine | Admitting: Internal Medicine

## 2022-04-06 DIAGNOSIS — Z6841 Body Mass Index (BMI) 40.0 and over, adult: Secondary | ICD-10-CM | POA: Insufficient documentation

## 2022-04-06 DIAGNOSIS — H469 Unspecified optic neuritis: Secondary | ICD-10-CM | POA: Diagnosis not present

## 2022-04-06 DIAGNOSIS — M1909 Primary osteoarthritis, other specified site: Secondary | ICD-10-CM | POA: Insufficient documentation

## 2022-04-06 DIAGNOSIS — L930 Discoid lupus erythematosus: Secondary | ICD-10-CM | POA: Diagnosis not present

## 2022-04-06 DIAGNOSIS — G588 Other specified mononeuropathies: Secondary | ICD-10-CM | POA: Diagnosis present

## 2022-04-06 MED ORDER — SODIUM CHLORIDE FLUSH 0.9 % IV SOLN
INTRAVENOUS | Status: AC
Start: 2022-04-06 — End: 2022-04-06
  Filled 2022-04-06: qty 10

## 2022-04-06 MED ORDER — SODIUM CHLORIDE 0.9 % IV SOLN
1000.0000 mg | Freq: Once | INTRAVENOUS | Status: AC
  Administered 2022-04-06: 1000 mg via INTRAVENOUS
  Filled 2022-04-06: qty 1000

## 2022-04-09 ENCOUNTER — Ambulatory Visit: Payer: Managed Care, Other (non HMO)

## 2022-04-17 ENCOUNTER — Ambulatory Visit (INDEPENDENT_AMBULATORY_CARE_PROVIDER_SITE_OTHER): Payer: Managed Care, Other (non HMO) | Admitting: Neurology

## 2022-04-17 ENCOUNTER — Encounter: Payer: Self-pay | Admitting: Neurology

## 2022-04-17 ENCOUNTER — Telehealth: Payer: Self-pay

## 2022-04-17 VITALS — BP 136/91 | HR 85 | Ht 67.0 in | Wt 259.5 lb

## 2022-04-17 DIAGNOSIS — L930 Discoid lupus erythematosus: Secondary | ICD-10-CM

## 2022-04-17 DIAGNOSIS — G4731 Primary central sleep apnea: Secondary | ICD-10-CM | POA: Diagnosis not present

## 2022-04-17 DIAGNOSIS — G35 Multiple sclerosis: Secondary | ICD-10-CM | POA: Diagnosis not present

## 2022-04-17 DIAGNOSIS — H469 Unspecified optic neuritis: Secondary | ICD-10-CM

## 2022-04-17 DIAGNOSIS — R4789 Other speech disturbances: Secondary | ICD-10-CM

## 2022-04-17 DIAGNOSIS — R5383 Other fatigue: Secondary | ICD-10-CM

## 2022-04-17 NOTE — Telephone Encounter (Signed)
Received Vumerity start form from Dr. Felecia Shelling.  Faxed to [facility].  Received a receipt of confirmation.  Completed PA for Vumerity via CMM.  Sent to [employer].  Should have a determination within 3-5 business days. Key: [id number]

## 2022-04-17 NOTE — Progress Notes (Signed)
[facility]  PATIENT: Ruth Woods DOB: 04/25/1966  REFERRING DOCTOR OR PCP: Blythe Stanford MD; Rexene Agent, MD SOURCE: Patient, notes from recent hospitalization, imaging and laboratory reports, multiple MRI images personally reviewed  _________________________________   HISTORICAL  CHIEF COMPLAINT:  Chief Complaint  Patient presents with   New Patient (Initial Visit)    Rm 2, w husband Dennard Nip. Pt referred for optic neuritis eval.     HISTORY OF PRESENT ILLNESS:  I had the pleasure of seeing patient, Ruth Woods, at [facility] for neurologic consultation regarding her optic neuritis and abnormal brain MRI is worrisome for multiple sclerosis.  She is a 56 year old woman who had the onset of left eye visual blurring and reduced visual field and eye pain upon movements that started early May 2023. Colors were   She saw ophthalmology and her eyes looked fine.   Symptoms persisted and eye pain worsened so she went to the ED 04/02/2022.   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   She also had 2 small enhancing lesions in the periventricular white matter.   She received 4 days of IV SOlumedrol (one gram/day) and visual symptoms and fatigue improved.      She has had some other symptoms over the past year that were attributed to her lupus.   Specifically, she sometimes has weakness in her legs that fluctuates though is normal the rest of the time.    Her gait is a little wore the past 2 years and she note sher balance is off at times.   She has had a couple falls, often associated with a dizzy/lightheaded spell.     These spells occur a couple times a month and seem random though may occur more after exerting herself.    She occasionally has left hand and right foot numbness.    She has urinary leakage at times.    She has some frequency and nocturia.  She has  noted more fatigue the last year.   The steroids helped her fatigue a lot.    She has had irritability and notes more anxiety//stress since the hospitalization. She has noted some brain fog, mostly coming up with the right words.      She snores sometimes.   She was been diagnosed with central sleep apnea 7-8 years ago but was unable to tolerate PAP.   She dozes off at times during the day. Shes wakes up a lot at night due to pain or nocturia.      She also has spinal stenosis in the lumbar spine.  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    She was diagnosed with SLE in 2010.   She was on plaquenil.     Imaging review: MRI of the orbits 04/02/2022 shows temporal peripheral enhancement of the left optic nerve.Marland Kitchen  MRI of the brain with and without contrast 04/02/2022 shows some T2/FLAIR hyperintense foci in the periventricular, juxtacortical and deep white matter of the hemispheres.  Two foci, 1 in the periventricular white matter on the right and one in the left frontal periventricular white matter enhance after contrast consistent with active demyelinating lesions.   No infratentorial lesions.   MRI of the cervical and thoracic spine 04/02/2022 showed a normal spinal cord.  There are degenerative changes noted.  At C3-C4, a  left disc osteophyte complex and facet hypertrophy causes moderately severe foraminal narrowing that could affect the left C4 nerve root.  Degenerative changes at other levels do not cause spinal stenosis or nerve root compression.  LABS: 04/02/2022:  CBC/D, CMP were noncontributary (does have elevated glucose).   HIV was negative.     REVIEW OF SYSTEMS: Constitutional: No fevers, chills, sweats, or change in appetite Eyes: No visual changes, double vision, eye pain Ear, nose and throat: No hearing loss, ear pain, nasal congestion, sore throat Cardiovascular: No chest pain, palpitations Respiratory:  No  shortness of breath at rest or with exertion.   No wheezes GastrointestinaI: No nausea, vomiting, diarrhea, abdominal pain, fecal incontinence Genitourinary:  No dysuria, urinary retention or frequency.  No nocturia. Musculoskeletal:  No neck pain, back pain Integumentary: No rash, pruritus, skin lesions Neurological: as above Psychiatric: No depression at this time.  No anxiety Endocrine: No palpitations, diaphoresis, change in appetite, change in weigh or increased thirst Hematologic/Lymphatic:  No anemia, purpura, petechiae. Allergic/Immunologic: No itchy/runny eyes, nasal congestion, recent allergic reactions, rashes  ALLERGIES: Allergies  Allergen Reactions   Levofloxacin Shortness Of Breath    Other reaction(s): Respiratory Distress   Levaquin [Levofloxacin In D5w]    Penicillins Rash    HOME MEDICATIONS:  Current Outpatient Medications:    HYDROcodone-acetaminophen (NORCO/VICODIN) 5-325 MG tablet, Take 1 tablet by mouth 2 (two) times daily as needed for pain., Disp: , Rfl:    hydrOXYzine (ATARAX) 25 MG tablet, Take 12.5-25 mg by mouth daily as needed., Disp: , Rfl:    meloxicam (MOBIC) 15 MG tablet, Take 15 mg by mouth daily at 6 (six) AM., Disp: , Rfl:    PLAQUENIL 200 MG tablet, Take 200 mg by mouth 2 (two) times daily., Disp: , Rfl:   PAST MEDICAL HISTORY: Past Medical History:  Diagnosis Date   Collagen vascular disease (HCC)    Lupus (HCC)     PAST SURGICAL HISTORY: Past Surgical History:  Procedure Laterality Date   ABDOMINAL HYSTERECTOMY     CHOLECYSTECTOMY      FAMILY HISTORY: Family History  Problem Relation Age of Onset   Diabetes Mother    Breast cancer Mother    Pancreatic cancer Father    Breast cancer Sister    Thyroid cancer Sister    Skin cancer Brother     SOCIAL HISTORY:  Social History   Socioeconomic History   Marital status: Married    Spouse name: Dennard Nip   Number of children: 2   Years of education: Not on file   Highest  education level: Some college, no degree  Occupational History   Not on file  Tobacco Use   Smoking status: Never   Smokeless tobacco: Never  Substance and Sexual Activity   Alcohol use: Yes    Comment: social   Drug use: Not Currently   Sexual activity: Not on file  Other Topics Concern   Not on file  Social History Narrative   Lives at home with husband   R handed   Caffeine: 2 C of coffee a day   Social Determinants of Health   Financial Resource Strain: Not on file  Food Insecurity: Not on file  Transportation Needs: Not on file  Physical Activity: Not on file  Stress: Not on file  Social Connections: Not on file  Intimate Partner Violence: Not on file     PHYSICAL EXAM  Vitals:   04/17/22 0843  BP: (!) 136/91  Pulse: 85  Weight: 259 lb 8 oz (117.7 kg)  Height: 5\' 7"  (1.702 m)    Body mass index is 40.64 kg/m.  Vision Screening   Right eye Left eye Both eyes  Without correction     With correction 20/50 20/50 20/40   Comments: Wearing glasses. Last eye exam 2 weeks ago.     General: The patient is well-developed and well-nourished and in no acute distress.  Pharynx is Mallampati 2  HEENT:  Head is Atkins/AT.  Sclera are anicteric.  Funduscopic exam shows normal optic discs and retinal vessels.  Neck: No carotid bruits are noted.  The neck is nontender.  Cardiovascular: The heart has a regular rate and rhythm with a normal S1 and S2. There were no murmurs, gallops or rubs.    Skin: Extremities are without rash or  edema.  Musculoskeletal:  Back is nontender  Neurologic Exam  Mental status: The patient is alert and oriented x 3 at the time of the examination. The patient has apparent normal recent and remote memory, with an apparently normal attention span and concentration ability.   Speech is normal.  Cranial nerves: Extraocular movements are full.  She had a 2+ left APD.  Visual fields are full.  Color vision was symmetric.  Facial symmetry is  present. There is good facial sensation to soft touch bilaterally.Facial strength is normal.  Trapezius and sternocleidomastoid strength is normal. No dysarthria is noted.  The tongue is midline, and the patient has symmetric elevation of the soft palate. No obvious hearing deficits are noted.  Motor:  Muscle bulk is normal.   Tone is normal. Strength is  5 / 5 in all 4 extremities except 4+/5 left APB   Other:  Positive Phalens's but negative Tinel's at left wrist.   Sensory: Sensory testing is intact to pinprick, soft touch in all 4 extremities.  Reduce vibration in toes.  Coordination: Cerebellar testing reveals good finger-nose-finger and heel-to-shin bilaterally.  Gait and station: Station is normal.   Gait is normal. Tandem gait is slightly wide. Romberg is negative.   Reflexes: Deep tendon reflexes are symmetric and normal bilaterally.   Plantar responses are flexor.     DIAGNOSTIC DATA (LABS, IMAGING, TESTING) - I reviewed patient records, labs, notes, testing and imaging myself where available.  Lab Results  Component Value Date   WBC 8.9 04/03/2022   HGB 12.2 04/03/2022   HCT 38.1 04/03/2022   MCV 82.1 04/03/2022   PLT 279 04/03/2022      Component Value Date/Time   NA 138 04/02/2022 1246   K 4.4 04/02/2022 1246   CL 106 04/02/2022 1246   CO2 22 04/02/2022 1246   GLUCOSE 136 (H) 04/02/2022 1246   BUN 16 04/02/2022 1246   CREATININE 0.52 04/03/2022 0300   CALCIUM 9.3 04/02/2022 1246   PROT 7.3 04/02/2022 1246   ALBUMIN 3.9 04/02/2022 1246   AST 23 04/02/2022 1246   ALT 22 04/02/2022 1246   ALKPHOS 84 04/02/2022 1246   BILITOT 0.8 04/02/2022 1246   GFRNONAA >60 04/03/2022 0300   GFRAA >60 07/18/2018 0536       ASSESSMENT AND PLAN  Multiple sclerosis (HCC)  Optic neuritis  Discoid lupus 2010  Central sleep apnea  Other fatigue  Word finding difficulty   In summary, Ms. Casebolt is a 56 year old woman who developed left optic neuritis May 2023 and  was found to have an MRI of the brain with multiple T2/FLAIR hyperintense foci consistent with MS.  2 of the  foci enhance consistent with acute demyelination.  She meets McDonald criteria for MS.  Therefore, we need to initiate a disease modifying therapy.  She is presenting in a less aggressive manner and we have multiple different options.  As she also has discoid lupus, I would like to place her on a medication that might help both.  There is some evidence in the literature that fumarates and leflunomide (related to teriflunomide) may have some benefit for lupus.  I will place her on Vumerity (diroximel fumarate).  We discussed the tolerability adverse events such as GI issues or flushing.  Additionally we will need to check blood work every 6 months to make sure that the lymphocyte count remains in normal range .  If she has trouble tolerating one of the fumarates, I will have her try teriflunomide.  If she has breakthrough activity on 1 of these agents, then I would consider an anti-CD20 agent.  Vision is improving.  We discussed the form that MS exacerbations might take in for her to contact me if she believes she is having an exacerbation.  She will return to see me for regular visit in 4 months or sooner if there are new or worsening neurologic symptoms.  Thank you for asking me to see Ms. Stubin.  Please let me know if I can be of further assistance with her or other patients in the future.   Nerea Bordenave A. Epimenio Foot, MD, [facility] 04/17/2022, 11:57 AM Certified in Neurology, Clinical Neurophysiology, Sleep Medicine and Neuroimaging  [facility] [address] [place], [place] [address] [phone]

## 2022-04-19 NOTE — Telephone Encounter (Signed)
[employer] PA was denied. Wanted to check and see if you have completed an appeal.  Contact info: [phone]

## 2022-04-24 NOTE — Telephone Encounter (Signed)
Dr. Epimenio Foot completed and signed appeal letter for Vumerity. Faxed to [employer] Clinical Review dept. Marked as urgent. Received a receipt of confirmation.

## 2022-04-30 NOTE — Telephone Encounter (Signed)
The appeal for Vumerity was approved by [employer] from 04/26/2022-04/27/2023. I have informed [employer].

## 2022-05-01 NOTE — Telephone Encounter (Signed)
I called patient. I advised her that the Vumerity was approved. [employer] has already reached out to her. She is waiting on a call from the specialty pharmacy. I will check with patient again next week. Pt verbalized understanding.

## 2022-05-03 NOTE — Telephone Encounter (Signed)
[employer], Landry Dyke would like a call back to discuss pt's medication shipment.  Contact info: [phone]

## 2022-05-03 NOTE — Telephone Encounter (Signed)
Called back. He states medication ready for shipment, pt needs to call specialty pharmacy to set up shipment ([facility]). Advised I will call pt to let her know. He verbalized understanding.   I called pt at [phone] and asked her to call [facility] to set up shipment at [phone].  She states she already spoke w/ specialty pharmacy and set up shipment. Husband called her today and told her medication arrived today. She will call if she has any more issues.

## 2022-05-07 NOTE — Telephone Encounter (Signed)
I spoke with Dr. Epimenio Foot. He recommends that patient stay on Vumerity 231mg  BID for the next 2 weeks and if her symptoms resolve she can increase to 462mg  BID.  I called patient to discuss. No answer, left a message asking her to call back.

## 2022-05-07 NOTE — Telephone Encounter (Signed)
Patient returned my call. I advised her of Dr. Bonnita Hollow recommendations regarding the vumerity dose. I will check on her next week to see if her symptoms have resolved. Patient will continue vumerity 231mg  BID. Pt verbalized understanding.

## 2022-05-07 NOTE — Telephone Encounter (Signed)
I called patient. She has started taking Vumerity 5 days ago. She had flushing on the first day but that has resolved. She is complaining of her bilateral arms aching/feeling heavy since starting Vumerity. She denies any recent heavy lifting. I advised her that if her symptoms don't improve in the next few days she should let us know. She might need to see her PCP as well. I will let Dr. Epimenio Foot know of this and call her back with further recommendations. Pt verbalized understanding.

## 2022-05-14 ENCOUNTER — Encounter: Payer: Self-pay | Admitting: Neurology

## 2022-05-16 ENCOUNTER — Encounter: Payer: Self-pay | Admitting: Neurology

## 2022-05-16 ENCOUNTER — Telehealth: Payer: Self-pay | Admitting: Neurology

## 2022-05-16 ENCOUNTER — Ambulatory Visit (INDEPENDENT_AMBULATORY_CARE_PROVIDER_SITE_OTHER): Payer: Managed Care, Other (non HMO) | Admitting: Neurology

## 2022-05-16 VITALS — BP 141/87 | HR 76 | Ht 67.0 in | Wt 261.0 lb

## 2022-05-16 DIAGNOSIS — R5383 Other fatigue: Secondary | ICD-10-CM | POA: Diagnosis not present

## 2022-05-16 DIAGNOSIS — G35 Multiple sclerosis: Secondary | ICD-10-CM | POA: Diagnosis not present

## 2022-05-16 DIAGNOSIS — L930 Discoid lupus erythematosus: Secondary | ICD-10-CM | POA: Diagnosis not present

## 2022-05-16 DIAGNOSIS — Z79899 Other long term (current) drug therapy: Secondary | ICD-10-CM | POA: Diagnosis not present

## 2022-05-16 NOTE — Telephone Encounter (Signed)
Placed [facility] in [facility] lock box for routine lab pick up. Results pending. 

## 2022-05-16 NOTE — Progress Notes (Signed)
[facility]  PATIENT: Ruth Woods DOB: [birth year]  REFERRING DOCTOR OR PCP: Blythe Stanford MD; Rexene Agent, MD SOURCE: Patient, notes from recent hospitalization, imaging and laboratory reports, multiple MRI images personally reviewed  _________________________________   HISTORICAL  CHIEF COMPLAINT:  Chief Complaint  Patient presents with   Follow-up    Rm 2 with husband here for f/u to discuss meds for MS, did not tolerate vumerity well.     HISTORY OF PRESENT ILLNESS:  Ruth Woods is a 56 y.o. woman with relapsing remitting multiple sclerosis.  Update 05/16/2022: After the past visit, she was started on Vumerity for her new diagnosis of RRMS.    However, she felt mentally off feeling more agitated/confused and a heavy sensation/ache in hr arms.   She also had night sweats.   She would like to switch to a different medication.      She has no new neurologic symptom or relapse.   Her vision is better.   Color is still slightly desaturated OS but acuity is back to normal.   She has noted more fatigue the last year.   The steroids helped her fatigue a lot.    She has had irritability and notes more anxiety//stress since the hospitalization. She has noted some brain fog, mostly coming up with the right words.      She has pain all over.   been on duloxetine but did not tolerate it.   She also had trouble tolerating Lyrica in the past.  She was once diagnosed with FMS and carries a diagnosis of SLE.   She also has spinal stenosis in the lumbar spine.  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    She snores sometimes.   She was been diagnosed with central sleep apnea 7-8 years ago but was unable to tolerate PAP.   She dozes off at times during the day. Shes wakes up a lot at night due to pain or nocturia.      She feels mood is generally fine though was worse on Vumerity.          MS History: She had the onset of left eye visual blurring and reduced visual field and eye pain upon movements that started early May 2023. Colors were desaturated  She saw ophthalmology and her eyes looked fine.   Symptoms persisted and eye pain worsened so she went to the ED 04/02/2022.   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   She also had 2 small enhancing lesions in the periventricular white matter.   She received 4 days of IV SOlumedrol (one gram/day) and visual symptoms and fatigue improved.      She has had some other symptoms over the past year that were attributed to her lupus.   Specifically, she sometimes has weakness in her legs that fluctuates though is normal the rest of the time.    Her gait is a little wore the past 2 years and she note sher balance is off at times.   She has had a couple falls, often associated with a dizzy/lightheaded spell.     These spells occur a couple times a month and seem random though may occur more after exerting herself.    She occasionally has left hand and right foot numbness.    She has urinary  leakage at times.    She has some frequency and nocturia.  She was diagnosed with SLE in 2010.   She was on plaquenil.     Imaging review: MRI of the orbits 04/02/2022 shows temporal peripheral enhancement of the left optic nerve.Marland Kitchen  MRI of the brain with and without contrast 04/02/2022 shows some T2/FLAIR hyperintense foci in the periventricular, juxtacortical and deep white matter of the hemispheres.  Two foci, 1 in the periventricular white matter on the right and one in the left frontal periventricular white matter enhance after contrast consistent with active demyelinating lesions.   No infratentorial lesions.   MRI of the cervical and thoracic spine 04/02/2022 showed a normal spinal cord.  There are degenerative changes noted.  At C3-C4, a left disc osteophyte complex and facet  hypertrophy causes moderately severe foraminal narrowing that could affect the left C4 nerve root.  Degenerative changes at other levels do not cause spinal stenosis or nerve root compression.  LABS: 04/02/2022:  CBC/D, CMP were noncontributary (does have elevated glucose).   HIV was negative.     REVIEW OF SYSTEMS: Constitutional: No fevers, chills, sweats, or change in appetite Eyes: No visual changes, double vision, eye pain Ear, nose and throat: No hearing loss, ear pain, nasal congestion, sore throat Cardiovascular: No chest pain, palpitations Respiratory:  No shortness of breath at rest or with exertion.   No wheezes GastrointestinaI: No nausea, vomiting, diarrhea, abdominal pain, fecal incontinence Genitourinary:  No dysuria, urinary retention or frequency.  No nocturia. Musculoskeletal:  No neck pain, back pain Integumentary: No rash, pruritus, skin lesions Neurological: as above Psychiatric: No depression at this time.  No anxiety Endocrine: No palpitations, diaphoresis, change in appetite, change in weigh or increased thirst Hematologic/Lymphatic:  No anemia, purpura, petechiae. Allergic/Immunologic: No itchy/runny eyes, nasal congestion, recent allergic reactions, rashes  ALLERGIES: Allergies  Allergen Reactions   Levofloxacin Shortness Of Breath    Other reaction(s): Respiratory Distress   Levaquin [Levofloxacin In D5w]    Penicillins Rash    HOME MEDICATIONS:  Current Outpatient Medications:    HYDROcodone-acetaminophen (NORCO/VICODIN) 5-325 MG tablet, Take 1 tablet by mouth 2 (two) times daily as needed for pain., Disp: , Rfl:    hydrOXYzine (ATARAX) 25 MG tablet, Take 12.5-25 mg by mouth daily as needed., Disp: , Rfl:    meloxicam (MOBIC) 15 MG tablet, Take 15 mg by mouth daily at 6 (six) AM., Disp: , Rfl:    PLAQUENIL 200 MG tablet, Take 200 mg by mouth 2 (two) times daily. (Patient not taking: Reported on 05/16/2022), Disp: , Rfl:   PAST MEDICAL HISTORY: Past  Medical History:  Diagnosis Date   Collagen vascular disease (HCC)    Lupus (HCC)     PAST SURGICAL HISTORY: Past Surgical History:  Procedure Laterality Date   ABDOMINAL HYSTERECTOMY     CHOLECYSTECTOMY      FAMILY HISTORY: Family History  Problem Relation Age of Onset   Diabetes Mother    Breast cancer Mother    Pancreatic cancer Father    Breast cancer Sister    Thyroid cancer Sister    Skin cancer Brother     SOCIAL HISTORY:  Social History   Socioeconomic History   Marital status: Married    Spouse name: Dennard Nip   Number of children: 2   Years of education: Not on file   Highest education level: Some college, no degree  Occupational History   Not on file  Tobacco Use   Smoking status:  Never   Smokeless tobacco: Never  Substance and Sexual Activity   Alcohol use: Yes    Comment: social   Drug use: Not Currently   Sexual activity: Not on file  Other Topics Concern   Not on file  Social History Narrative   Lives at home with husband   R handed   Caffeine: 2 C of coffee a day   Social Determinants of Health   Financial Resource Strain: Not on file  Food Insecurity: Not on file  Transportation Needs: Not on file  Physical Activity: Not on file  Stress: Not on file  Social Connections: Not on file  Intimate Partner Violence: Not on file     PHYSICAL EXAM  Vitals:   05/16/22 0829  BP: (!) 141/87  Pulse: 76  Weight: 261 lb (118.4 kg)  Height: 5\' 7"  (1.702 m)    Body mass index is 40.88 kg/m.  No results found.   General: The patient is well-developed and well-nourished and in no acute distress.  Pharynx is Mallampati 2  HEENT:  Head is [place]/AT.  Sclera are anicteric.  Funduscopic exam shows normal optic discs and retinal vessels.  Neck: good ROM  Musculoskeletal:  Back is nontender  Neurologic Exam  Mental status: The patient is alert and oriented x 3 at the time of the examination. The patient has apparent normal recent and remote  memory, with an apparently normal attention span and concentration ability.   Speech is normal.  Cranial nerves: Extraocular movements are full.  She had a 2+ left APD.     Color vision was symmetric.  Facial symmetry is present. There is good facial sensation to soft touch bilaterally.Facial strength is normal.  Trapezius and sternocleidomastoid strength is normal. No dysarthria is noted.  . No obvious hearing deficits are noted.  Motor:  Muscle bulk is normal.   Tone is normal. Strength is  5 / 5 in all 4 extremities except 4+/5 left APB   Other:  Positive Phalens's but negative Tinel's at left wrist.   Sensory: Sensory testing is intact to pinprick, soft touch in all 4 extremities except red vibration in left hand.  Coordination: Cerebellar testing reveals good finger-nose-finger and heel-to-shin bilaterally.  Gait and station: Station is normal.   The gait was normal.  Tandem gait was mildly wide.. Romberg is negative.   Reflexes: Deep tendon reflexes are symmetric and normal bilaterally.        DIAGNOSTIC DATA (LABS, IMAGING, TESTING) - I reviewed patient records, labs, notes, testing and imaging myself where available.  Lab Results  Component Value Date   WBC 8.9 04/03/2022   HGB 12.2 04/03/2022   HCT 38.1 04/03/2022   MCV 82.1 04/03/2022   PLT 279 04/03/2022      Component Value Date/Time   NA 138 04/02/2022 1246   K 4.4 04/02/2022 1246   CL 106 04/02/2022 1246   CO2 22 04/02/2022 1246   GLUCOSE 136 (H) 04/02/2022 1246   BUN 16 04/02/2022 1246   CREATININE 0.52 04/03/2022 0300   CALCIUM 9.3 04/02/2022 1246   PROT 7.3 04/02/2022 1246   ALBUMIN 3.9 04/02/2022 1246   AST 23 04/02/2022 1246   ALT 22 04/02/2022 1246   ALKPHOS 84 04/02/2022 1246   BILITOT 0.8 04/02/2022 1246   GFRNONAA >60 04/03/2022 0300   GFRAA >60 07/18/2018 0536       ASSESSMENT AND PLAN  Multiple sclerosis (HCC) - Plan: QuantiFERON-TB Gold Plus, Varicella zoster antibody, IgG, Stratify [facility]  Antibody Test ([facility]), Hepatic function panel  High risk medication use - Plan: QuantiFERON-TB Gold Plus, Varicella zoster antibody, IgG, Stratify [facility] Antibody Test ([facility]), Hepatic function panel  Discoid lupus 2010  Other fatigue  She has stopped the Vumerity due to side effects.  We will start Aubagio.   Check TB test before sending prescription in..     If mood worsens again, consider an SSRI or Effexor Rtc 3 months   Jaime Grizzell A. Epimenio Foot, MD, [facility] 05/16/2022, 12:38 PM Certified in Neurology, Clinical Neurophysiology, Sleep Medicine and Neuroimaging  [facility] [address], [address] [place], [place] [address] [phone]

## 2022-05-20 LAB — QUANTIFERON-TB GOLD PLUS
QuantiFERON Mitogen Value: >10 IU/mL
QuantiFERON Nil Value: 0 IU/mL
QuantiFERON TB1 Ag Value: 0 IU/mL
QuantiFERON TB2 Ag Value: 0 IU/mL
QuantiFERON-TB Gold Plus: NEGATIVE

## 2022-05-20 LAB — VARICELLA ZOSTER ANTIBODY, IGG: Varicella zoster IgG: 3379 index (ref >165)

## 2022-05-23 NOTE — Telephone Encounter (Signed)
JCV Results  Index Value: 0.36 H JCV Antibody: Indeterminate  Inhibition Assay: Final Result: NEGATIVE

## 2022-05-28 ENCOUNTER — Telehealth: Payer: Self-pay

## 2022-05-28 ENCOUNTER — Encounter: Payer: Self-pay | Admitting: *Deleted

## 2022-05-28 MED ORDER — TERIFLUNOMIDE 14 MG PO TABS: 14.0000 mg | ORAL_TABLET | Freq: Every day | ORAL | 1 refills | Status: DC

## 2022-05-28 MED ORDER — TERIFLUNOMIDE 14 MG PO TABS
14.0000 mg | ORAL_TABLET | Freq: Every day | ORAL | 1 refills | Status: DC
Start: 2022-05-28 — End: 2022-05-28

## 2022-05-28 NOTE — Addendum Note (Signed)
Addended by: Geronimo Running A on: 05/28/2022 03:42 PM   Modules accepted: Orders

## 2022-05-28 NOTE — Telephone Encounter (Signed)
[facility] at [phone]. Spoke w/ Hilary Hertz. Unable to fill teriflunomide d/t being specialty drug. [facility] if [facility] received. They would transfer to them.

## 2022-05-28 NOTE — Telephone Encounter (Signed)
RX for teriflunomide sent to [facility] per [place] encounters.  PA completed via CMM and sent to [employer]. Key: [id number]. Should have a determination within 3-5 business days.

## 2022-05-30 NOTE — Telephone Encounter (Signed)
Received fax from [employer] that teriflinomide does not require a prior authorization.

## 2022-06-11 NOTE — Telephone Encounter (Signed)
I called patient.  She reports that she has decided not to take teriflunomide.  She does not want to start any medications at this time for her MS.  I advised her that if she changes her mind to please let us know.  I will pass along this information to Dr. Epimenio Foot.  Patient verbalized understanding.

## 2022-06-20 ENCOUNTER — Other Ambulatory Visit: Payer: Self-pay | Admitting: Adult Health

## 2022-06-20 DIAGNOSIS — Z1231 Encounter for screening mammogram for malignant neoplasm of breast: Secondary | ICD-10-CM

## 2022-07-07 LAB — COLOGUARD: Cologuard: NEGATIVE

## 2022-07-11 ENCOUNTER — Ambulatory Visit
Admission: RE | Admit: 2022-07-11 | Discharge: 2022-07-11 | Disposition: A | Discharge status: Home / Self Care | Payer: Managed Care, Other (non HMO) | Source: Ambulatory Visit | Attending: Adult Health | Admitting: Adult Health

## 2022-07-11 DIAGNOSIS — Z1231 Encounter for screening mammogram for malignant neoplasm of breast: Secondary | ICD-10-CM | POA: Insufficient documentation

## 2022-07-30 ENCOUNTER — Other Ambulatory Visit: Payer: Self-pay | Admitting: *Deleted

## 2022-07-30 ENCOUNTER — Inpatient Hospital Stay
Admission: RE | Admit: 2022-07-30 | Discharge: 2022-07-30 | Disposition: A | Discharge status: Home / Self Care | Payer: Self-pay | Source: Ambulatory Visit | Attending: *Deleted | Admitting: *Deleted

## 2022-07-30 DIAGNOSIS — Z1231 Encounter for screening mammogram for malignant neoplasm of breast: Secondary | ICD-10-CM

## 2022-08-08 ENCOUNTER — Ambulatory Visit: Payer: Managed Care, Other (non HMO) | Admitting: Neurology

## 2022-11-22 ENCOUNTER — Ambulatory Visit: Payer: 59 | Admitting: Dermatology

## 2022-11-22 VITALS — BP 137/89 | HR 89

## 2022-11-22 DIAGNOSIS — L578 Other skin changes due to chronic exposure to nonionizing radiation: Secondary | ICD-10-CM | POA: Diagnosis not present

## 2022-11-22 DIAGNOSIS — L821 Other seborrheic keratosis: Secondary | ICD-10-CM | POA: Diagnosis not present

## 2022-11-22 DIAGNOSIS — L820 Inflamed seborrheic keratosis: Secondary | ICD-10-CM | POA: Diagnosis not present

## 2022-11-22 NOTE — Progress Notes (Signed)
   New Patient Visit  Subjective  Ruth Woods is a 57 y.o. female who presents for the following: check spots (Chest, L infra ocular, R neck, 67m, gets a crusty top layer and flakes off and comes back). The patient has spots, moles and lesions to be evaluated, some may be new or changing and the patient has concerns.  The following portions of the chart were reviewed this encounter and updated as appropriate:   Tobacco  Allergies  Meds  Problems  Med Hx  Surg Hx  Fam Hx     Review of Systems:  No other skin or systemic complaints except as noted in HPI or Assessment and Plan.  Objective  Well appearing patient in no apparent distress; mood and affect are within normal limits.  A focused examination was performed including face, chest. Relevant physical exam findings are noted in the Assessment and Plan.  L infra ocular x 1, R neck x 1 (2) Stuck on waxy paps with erythema   Assessment & Plan   Seborrheic Keratoses - Stuck-on, waxy, tan-brown papules and/or plaques  - Benign-appearing - Discussed benign etiology and prognosis. - Observe - Call for any changes - neck  Actinic Damage - chronic, secondary to cumulative UV radiation exposure/sun exposure over time - diffuse scaly erythematous macules with underlying dyspigmentation - Recommend daily broad spectrum sunscreen SPF 30+ to sun-exposed areas, reapply every 2 hours as needed.  - Recommend staying in the shade or wearing long sleeves, sun glasses (UVA+UVB protection) and wide brim hats (4-inch brim around the entire circumference of the hat). - Call for new or changing lesions.   Inflamed seborrheic keratosis (2) L infra ocular x 1, R neck x 1 Symptomatic, irritating, patient would like treated. Destruction of lesion - L infra ocular x 1, R neck x 1 Complexity: simple   Destruction method: cryotherapy   Informed consent: discussed and consent obtained   Timeout:  patient name, date of birth, surgical  site, and procedure verified Lesion destroyed using liquid nitrogen: Yes   Region frozen until ice ball extended beyond lesion: Yes   Outcome: patient tolerated procedure well with no complications   Post-procedure details: wound care instructions given    Return if symptoms worsen or fail to improve.  I, Othelia Pulling, RMA, am acting as scribe for Sarina Ser, MD . Documentation: I have reviewed the above documentation for accuracy and completeness, and I agree with the above.  Sarina Ser, MD

## 2022-11-22 NOTE — Patient Instructions (Addendum)

## 2022-11-24 ENCOUNTER — Encounter: Payer: Self-pay | Admitting: Dermatology

## 2022-11-25 DIAGNOSIS — J019 Acute sinusitis, unspecified: Secondary | ICD-10-CM | POA: Diagnosis not present

## 2022-11-28 DIAGNOSIS — J0100 Acute maxillary sinusitis, unspecified: Secondary | ICD-10-CM | POA: Diagnosis not present

## 2022-12-20 DIAGNOSIS — Z419 Encounter for procedure for purposes other than remedying health state, unspecified: Secondary | ICD-10-CM | POA: Diagnosis not present

## 2023-01-18 DIAGNOSIS — Z419 Encounter for procedure for purposes other than remedying health state, unspecified: Secondary | ICD-10-CM | POA: Diagnosis not present

## 2023-02-18 DIAGNOSIS — Z419 Encounter for procedure for purposes other than remedying health state, unspecified: Secondary | ICD-10-CM | POA: Diagnosis not present

## 2023-02-18 NOTE — Progress Notes (Unsigned)
New patient visit   Patient: Ruth Woods   DOB: 06/24/1966   57 y.o. Female  MRN: [id number] Visit Date: 02/20/2023  Today's healthcare provider: Eulis Foster, MD   No chief complaint on file.  Subjective    Ruth Woods is a 57 y.o. female who presents today as a new patient to establish care.  HPI  ***  Past Medical History:  Diagnosis Date   Collagen vascular disease ([place])    Lupus ([place])    Past Surgical History:  Procedure Laterality Date   ABDOMINAL HYSTERECTOMY     CHOLECYSTECTOMY     Family Status  Relation Name Status   Mother  Alive   Father  Alive   Sister  Alive   Brother  Alive   Family History  Problem Relation Age of Onset   Diabetes Mother    Breast cancer Mother    Pancreatic cancer Father    Breast cancer Sister    Thyroid cancer Sister    Skin cancer Brother    Social History   Socioeconomic History   Marital status: Married    Spouse name: Cornelia Copa   Number of children: 2   Years of education: Not on file   Highest education level: Some college, no degree  Occupational History   Not on file  Tobacco Use   Smoking status: Never   Smokeless tobacco: Never  Substance and Sexual Activity   Alcohol use: Yes    Comment: social   Drug use: Not Currently   Sexual activity: Not on file  Other Topics Concern   Not on file  Social History Narrative   Lives at home with husband   R handed   Caffeine: 2 C of coffee a day   Social Determinants of Radio broadcast assistant Strain: Not on file  Food Insecurity: Not on file  Transportation Needs: Not on file  Physical Activity: Not on file  Stress: Not on file  Social Connections: Not on file   Outpatient Medications Prior to Visit  Medication Sig   HYDROcodone-acetaminophen (NORCO/VICODIN) 5-325 MG tablet Take 1 tablet by mouth 2 (two) times daily as needed for pain.   hydrOXYzine (ATARAX) 25 MG tablet Take 12.5-25 mg by mouth daily as needed.    meloxicam (MOBIC) 15 MG tablet Take 15 mg by mouth daily at 6 (six) AM.   PLAQUENIL 200 MG tablet Take 200 mg by mouth 2 (two) times daily. (Patient not taking: Reported on 05/16/2022)   Teriflunomide 14 MG TABS Take 14 mg by mouth daily.   No facility-administered medications prior to visit.   Allergies  Allergen Reactions   Levofloxacin Shortness Of Breath    Other reaction(s): Respiratory Distress   Levaquin [Levofloxacin In D5w]    Penicillins Rash     There is no immunization history on file for this patient.  Health Maintenance  Topic Date Due   COVID-19 Vaccine (1) Never done   Hepatitis C Screening  Never done   DTaP/Tdap/Td (1 - Tdap) Never done   Zoster Vaccines- Shingrix (1 of 2) Never done   PAP SMEAR-Modifier  Never done   COLONOSCOPY (Pts 45-80yrs Insurance coverage will need to be confirmed)  Never done   INFLUENZA VACCINE  06/20/2023   MAMMOGRAM  07/11/2024   HIV Screening  Completed   HPV VACCINES  Aged Out    Patient Care Team: Eda Paschal, MD as PCP - General (Internal Medicine)  Review of Systems  {  Labs  Heme  Chem  Endocrine  Serology  Results Review (optional):23779}   Objective    There were no vitals taken for this visit. {Show previous vital signs (optional):23777}  Physical Exam ***  Depression Screen     No data to display         No results found for any visits on 02/20/23.  Assessment & Plan      Problem List Items Addressed This Visit   None    No follow-ups on file.       The entirety of the information documented in the History of Present Illness, Review of Systems and Physical Exam were personally obtained by me. Portions of this information were initially documented by *** . I, Eulis Foster, MD have reviewed the documentation above for thoroughness and accuracy.      Eulis Foster, MD  [facility] [phone] (phone) [phone] (fax)  [place]

## 2023-02-20 ENCOUNTER — Encounter: Payer: Self-pay | Admitting: Family Medicine

## 2023-02-20 ENCOUNTER — Telehealth: Payer: Self-pay | Admitting: Family Medicine

## 2023-02-20 ENCOUNTER — Ambulatory Visit (INDEPENDENT_AMBULATORY_CARE_PROVIDER_SITE_OTHER): Payer: 59 | Admitting: Family Medicine

## 2023-02-20 VITALS — BP 119/78 | HR 79 | Temp 98.7°F | Resp 16 | Ht 66.0 in | Wt 264.0 lb

## 2023-02-20 DIAGNOSIS — G4701 Insomnia due to medical condition: Secondary | ICD-10-CM

## 2023-02-20 DIAGNOSIS — E042 Nontoxic multinodular goiter: Secondary | ICD-10-CM

## 2023-02-20 DIAGNOSIS — Z791 Long term (current) use of non-steroidal anti-inflammatories (NSAID): Secondary | ICD-10-CM | POA: Insufficient documentation

## 2023-02-20 DIAGNOSIS — M159 Polyosteoarthritis, unspecified: Secondary | ICD-10-CM

## 2023-02-20 DIAGNOSIS — R233 Spontaneous ecchymoses: Secondary | ICD-10-CM | POA: Diagnosis not present

## 2023-02-20 DIAGNOSIS — Z7689 Persons encountering health services in other specified circumstances: Secondary | ICD-10-CM

## 2023-02-20 DIAGNOSIS — G35 Multiple sclerosis: Secondary | ICD-10-CM

## 2023-02-20 DIAGNOSIS — M48061 Spinal stenosis, lumbar region without neurogenic claudication: Secondary | ICD-10-CM | POA: Insufficient documentation

## 2023-02-20 DIAGNOSIS — M150 Primary generalized (osteo)arthritis: Secondary | ICD-10-CM

## 2023-02-20 DIAGNOSIS — G8929 Other chronic pain: Secondary | ICD-10-CM

## 2023-02-20 DIAGNOSIS — G4731 Primary central sleep apnea: Secondary | ICD-10-CM

## 2023-02-20 MED ORDER — HYDROCODONE-ACETAMINOPHEN 5-325 MG PO TABS: 1.0000 | ORAL_TABLET | Freq: Two times a day (BID) | ORAL | 0 refills | Status: DC | PRN

## 2023-02-20 MED ORDER — MELOXICAM 15 MG PO TABS: 15.0000 mg | ORAL_TABLET | Freq: Every day | ORAL | 1 refills | Status: DC

## 2023-02-20 NOTE — Assessment & Plan Note (Signed)
Chronic  Stable  No treatment at this time

## 2023-02-20 NOTE — Patient Instructions (Addendum)
It was a pleasure meeting you today!  Welcome to [facility].  I look forward to taking part in your care as your new primary care physician.    Summary of our discussion today:   I have provided refills for your medications  We will collect blood work today and follow up with you once the results are available I have submitted an orthopedics referral please contact [facility] to get an appointment at your earliest convenience.   You should return to our clinic near the end of May or early June for your physical   Best Wishes,   Dr. Quentin Cornwall

## 2023-02-20 NOTE — Assessment & Plan Note (Signed)
New petechiae rash on bilateral lower extremities  Will check CBC to assess hgb and platelets  No medications prescribed at this time

## 2023-02-20 NOTE — Telephone Encounter (Signed)
[facility] Key: [place] Name: [place] Hydrocodone- Acetaminophen 5-325MG  Tablets

## 2023-02-20 NOTE — Telephone Encounter (Signed)
Pa started today and sent to plan. Waiting on the outcome

## 2023-02-20 NOTE — Assessment & Plan Note (Addendum)
Chronic  On chronic narcotics along with NSAIDS for pain management  PDMP reviewed  Will refill medications including meloxicam 15mg  daily and norco 10-325mg  BID PRN

## 2023-02-20 NOTE — Assessment & Plan Note (Signed)
Stable, chronic  Has appt schduled with endocrinology in May

## 2023-02-20 NOTE — Assessment & Plan Note (Signed)
Chronic  Patient will continue pain regimen of meloxicam 15mg  daily along with PRN 5-325mg  hydrocodone-acetaminophen  Prescriptions refills provided today  PDMP reviewed and appropriate  Given long hx of NSAID use, will order CMP to assess kidney function

## 2023-02-20 NOTE — Assessment & Plan Note (Signed)
Recent DGX, stable  Not currently on treatment  Has tried Vumerity and Dc'd due to SE  Was offered Aubagio but decided to not start the symptoms due to concern for limited efficacy from her reading  She reports mild symptoms Follows with Neurology, Dr. Felecia Shelling, no followed scheduled at this point but she plans to schedule an appt for surveillance  No medications added for this problem today

## 2023-02-20 NOTE — Assessment & Plan Note (Signed)
Welcomed patient to [facility]  Reviewed patient's medical history, medications, surgical and social history Discussed roles and expectations for primary care physician-patient relationship Recommended patient schedule annual preventative examinations   

## 2023-02-20 NOTE — Assessment & Plan Note (Signed)
Will check creatine Providing refill for meloxicam 15mg  daily per patient request

## 2023-02-21 LAB — COMPREHENSIVE METABOLIC PANEL
ALT: 26 IU/L (ref 0–32)
AST: 17 IU/L (ref 0–40)
Albumin/Globulin Ratio: 1.4 (ref 1.2–2.2)
Albumin: 4.2 g/dL (ref 3.8–4.9)
Alkaline Phosphatase: 107 IU/L (ref 44–121)
BUN/Creatinine Ratio: 22 (ref 9–23)
BUN: 14 mg/dL (ref 6–24)
Bilirubin Total: 0.4 mg/dL (ref 0.0–1.2)
CO2: 22 mmol/L (ref 20–29)
Calcium: 9.9 mg/dL (ref 8.7–10.2)
Chloride: 104 mmol/L (ref 96–106)
Creatinine, Ser: 0.65 mg/dL (ref 0.57–1.00)
Globulin, Total: 3 g/dL (ref 1.5–4.5)
Glucose: 112 mg/dL — ABNORMAL HIGH (ref 70–99)
Potassium: 4.7 mmol/L (ref 3.5–5.2)
Sodium: 141 mmol/L (ref 134–144)
Total Protein: 7.2 g/dL (ref 6.0–8.5)
eGFR: 103 mL/min/{1.73_m2} (ref >59)

## 2023-02-21 LAB — CBC
Hematocrit: 42.2 % (ref 34.0–46.6)
Hemoglobin: 13.6 g/dL (ref 11.1–15.9)
MCH: 25.9 pg — ABNORMAL LOW (ref 26.6–33.0)
MCHC: 32.2 g/dL (ref 31.5–35.7)
MCV: 80 fL (ref 79–97)
Platelets: 335 10*3/uL (ref 150–450)
RBC: 5.25 x10E6/uL (ref 3.77–5.28)
RDW: 14.1 % (ref 11.7–15.4)
WBC: 6.5 10*3/uL (ref 3.4–10.8)

## 2023-02-21 NOTE — Telephone Encounter (Signed)
Outcome Approved today Your PA request has been approved. Additional information will be provided in the approval communication. (Message 1145) Authorization Expiration Date: 08/20/2023 Drug HYDROcodone-Acetaminophen 5-325MG  tablets  Patient advised of approval.

## 2023-02-22 DIAGNOSIS — S76312A Strain of muscle, fascia and tendon of the posterior muscle group at thigh level, left thigh, initial encounter: Secondary | ICD-10-CM | POA: Diagnosis not present

## 2023-02-22 DIAGNOSIS — M7072 Other bursitis of hip, left hip: Secondary | ICD-10-CM | POA: Diagnosis not present

## 2023-02-22 DIAGNOSIS — S76311A Strain of muscle, fascia and tendon of the posterior muscle group at thigh level, right thigh, initial encounter: Secondary | ICD-10-CM | POA: Diagnosis not present

## 2023-03-20 DIAGNOSIS — Z419 Encounter for procedure for purposes other than remedying health state, unspecified: Secondary | ICD-10-CM | POA: Diagnosis not present

## 2023-04-01 ENCOUNTER — Other Ambulatory Visit: Payer: Self-pay | Admitting: Family Medicine

## 2023-04-01 MED ORDER — HYDROCODONE-ACETAMINOPHEN 5-325 MG PO TABS: 1.0000 | ORAL_TABLET | Freq: Two times a day (BID) | ORAL | 0 refills | Status: DC | PRN

## 2023-04-10 ENCOUNTER — Other Ambulatory Visit: Payer: Self-pay | Admitting: Internal Medicine

## 2023-04-10 ENCOUNTER — Encounter: Payer: Self-pay | Admitting: Internal Medicine

## 2023-04-10 ENCOUNTER — Ambulatory Visit: Payer: 59 | Admitting: Internal Medicine

## 2023-04-10 VITALS — BP 124/70 | HR 78 | Ht 66.0 in | Wt 264.0 lb

## 2023-04-10 DIAGNOSIS — E042 Nontoxic multinodular goiter: Secondary | ICD-10-CM | POA: Diagnosis not present

## 2023-04-10 NOTE — Progress Notes (Signed)
Name: Ruth Woods  MRN/ DOB: [id number], September 25, 1966    Age/ Sex: 57 y.o., female    PCP: [name], MD   Reason for Endocrinology Evaluation: MNG     Date of Initial Endocrinology Evaluation: 04/10/2023     HPI: Ruth Woods is a 57 y.o. female with a past medical history of MNG, fibromyalgia and MS . The patient presented for initial endocrinology clinic visit on 04/10/2023 for consultative assistance with her thyromegaly.   Patient has been diagnosed with multinodular goiter many years ago.  She had a reported left thyroid nodule FNA in 2011 that was reported as inconclusive  ? FLUS (records not available) while living in [place]  She is s/p benign FNA of right thyroid nodule with benign cytology in 08/2018, these records are available under media   An MRI of the brain 03/2022 continued to show enlarged left thyroid lobe   She had TFTs through her PCPs office 08/2022 with normal TSH, total T4, she also had undetectable thyroglobulin antibodies as well as an detectable thyroid peroxidase antibodies.  She has not noted an local neck swelling  Continues with chronic dysphagia, which is worsening  Denies palpitations  Has occasional tremors that she attributes with MS  Has chronic diarrhea  She did not tolerate med for MS    Mother S/P   thyroidectomy follicular cancer and sister follicular and papillary cancer     HISTORY:  Past Medical History:  Past Medical History:  Diagnosis Date   Carpal tunnel syndrome 10/08/2018   Collagen vascular disease (HCC)    Lupus (HCC)    Polyarthralgia 05/28/2019   Past Surgical History:  Past Surgical History:  Procedure Laterality Date   ABDOMINAL HYSTERECTOMY     CESAREAN SECTION  1992   CHOLECYSTECTOMY      Social History:  reports that she has never smoked. She has never used smokeless tobacco. She reports current alcohol use. She reports that she does not currently use drugs. Family History:  family history includes Breast cancer in her mother and sister; Diabetes in her mother; Pancreatic cancer in her father; Skin cancer in her brother; Thyroid cancer in her sister.   HOME MEDICATIONS: Allergies as of 04/10/2023       Reactions   Levofloxacin Shortness Of Breath   Other reaction(s): Respiratory Distress   Levaquin [levofloxacin In D5w]    Penicillins Rash        Medication List        Accurate as of Apr 10, 2023  4:08 PM. If you have any questions, ask your nurse or doctor.          HYDROcodone-acetaminophen 5-325 MG tablet Commonly known as: NORCO/VICODIN Take 1 tablet by mouth 2 (two) times daily as needed for severe pain.   hydrOXYzine 25 MG tablet Commonly known as: ATARAX Take 12.5-25 mg by mouth daily as needed.   meloxicam 15 MG tablet Commonly known as: MOBIC Take 1 tablet (15 mg total) by mouth daily at 6 (six) AM.          REVIEW OF SYSTEMS: A comprehensive ROS was conducted with the patient and is negative except as per HPI     OBJECTIVE:  VS: BP 124/70 (BP Location: Left Arm, Patient Position: Sitting, Cuff Size: Large)   Pulse 78   Ht 5\' 6"  (1.676 m)   Wt 264 lb (119.7 kg)   SpO2 98%   BMI 42.61 kg/m    Wt Readings from  Last 3 Encounters:  04/10/23 264 lb (119.7 kg)  02/20/23 264 lb (119.7 kg)  05/16/22 261 lb (118.4 kg)     EXAM: General: Pt appears well and is in NAD  Neck: General: Supple without adenopathy. Thyroid: Thyroid asymmetry noted on exam today  Lungs: Clear with good BS bilat   Heart: Auscultation: RRR.  Abdomen: Soft, nontender  Extremities:  BL LE: No pretibial edema   Mental Status: Judgment, insight: Intact Orientation: Oriented to time, place, and person Mood and affect: No depression, anxiety, or agitation     DATA REVIEWED:     Latest Reference Range & Units 02/20/23 09:30  Sodium 134 - 144 mmol/L 141  Potassium 3.5 - 5.2 mmol/L 4.7  Chloride 96 - 106 mmol/L 104  CO2 20 - 29 mmol/L 22   Glucose 70 - 99 mg/dL 161 (H)  BUN 6 - 24 mg/dL 14  Creatinine 0.96 - 0.45 mg/dL 4.09  Calcium 8.7 - 81.1 mg/dL 9.9  BUN/Creatinine Ratio 9 - 23  22  eGFR >59 mL/min/1.73 103  Alkaline Phosphatase 44 - 121 IU/L 107  Albumin 3.8 - 4.9 g/dL 4.2  Albumin/Globulin Ratio 1.2 - 2.2  1.4  AST 0 - 40 IU/L 17  ALT 0 - 32 IU/L 26  Total Protein 6.0 - 8.5 g/dL 7.2  Total Bilirubin 0.0 - 1.2 mg/dL 0.4  (H): Data is abnormally high   Old records , labs and images have been reviewed.   ASSESSMENT/PLAN/RECOMMENDATIONS:   Multinodular goiter  -Patient with chronic dysphagia -She has had history of multiple FNAs over the past few years, she had 1 cytology report described as inconclusive in 2011 of the left nodule (records not available).  She did have benign FNA of the right and the left thyroid nodules in 2019 -Her previous endocrinologist had recommended surgery, I did explain to the patient that we will have to order thyroid ultrasound and proceed from there -We also discussed radiofrequency ablation versus thyroidectomy depending on thyroid ultrasound/cytology results -Patient was given printed lab orders to take to [facility] for TFTs, as we do not have a phlebotomist today -Thyroid ultrasound has been ordered    Signed electronically by: Lyndle Herrlich, MD  [facility] Endocrinology  [facility] [address] [place] [place], [place] [address] Phone: [phone] FAX: [phone]   CC: [name], MD [address] [place] [address] Phone: [phone] Fax: [phone]   Return to Endocrinology clinic as below: Future Appointments  Date Time Provider Department Center  10/01/2023  9:50 AM Amonda Brillhart, Konrad Dolores, MD [facility] None

## 2023-04-10 NOTE — Patient Instructions (Signed)
Please have labs done at [facility] :   Address : [address], [place], [place] [address] Phone [phone]  

## 2023-04-11 LAB — TSH: TSH: 0.773 u[IU]/mL (ref 0.450–4.500)

## 2023-04-11 LAB — T4, FREE: Free T4: 1.22 ng/dL (ref 0.82–1.77)

## 2023-04-20 DIAGNOSIS — Z419 Encounter for procedure for purposes other than remedying health state, unspecified: Secondary | ICD-10-CM | POA: Diagnosis not present

## 2023-04-26 ENCOUNTER — Other Ambulatory Visit: Payer: 59

## 2023-04-30 ENCOUNTER — Ambulatory Visit
Admission: RE | Admit: 2023-04-30 | Discharge: 2023-04-30 | Disposition: A | Discharge status: Home / Self Care | Payer: 59 | Source: Ambulatory Visit | Attending: Internal Medicine | Admitting: Internal Medicine

## 2023-04-30 DIAGNOSIS — E042 Nontoxic multinodular goiter: Secondary | ICD-10-CM

## 2023-05-03 ENCOUNTER — Telehealth: Payer: Self-pay | Admitting: Internal Medicine

## 2023-05-03 NOTE — Telephone Encounter (Signed)
Please let the patient know that thyroid ultrasound shows multiple thyroid nodules.  3 nodules need to be biopsied, the patient has had biopsies in the past so we will be similar process, this will be done at the same place where she had her ultrasound.   When they do the biopsy, they can only do 2 at a time, so I will need to order the first 2 biopsies, once they are resulted, I will order the third biopsy.   Please get approval from the patient so I can place the orders   Thanks

## 2023-05-13 ENCOUNTER — Other Ambulatory Visit: Payer: Self-pay | Admitting: Family Medicine

## 2023-05-13 ENCOUNTER — Telehealth: Payer: Self-pay | Admitting: Internal Medicine

## 2023-05-13 ENCOUNTER — Encounter: Payer: Self-pay | Admitting: Internal Medicine

## 2023-05-13 DIAGNOSIS — E042 Nontoxic multinodular goiter: Secondary | ICD-10-CM

## 2023-05-13 MED ORDER — HYDROCODONE-ACETAMINOPHEN 5-325 MG PO TABS: 1.0000 | ORAL_TABLET | Freq: Two times a day (BID) | ORAL | 0 refills | Status: DC | PRN

## 2023-05-13 NOTE — Telephone Encounter (Signed)
Ruth Woods,    Can you please follow  up on the previous call from 6/14?   Thanks

## 2023-05-13 NOTE — Telephone Encounter (Signed)
Patient states that she was able to obtain previous biopsy and would like to have you review those before she schedules.  She will upload and send in mychart.

## 2023-05-20 DIAGNOSIS — Z419 Encounter for procedure for purposes other than remedying health state, unspecified: Secondary | ICD-10-CM | POA: Diagnosis not present

## 2023-05-21 NOTE — Progress Notes (Unsigned)
I,Ruth Woods,acting as a Neurosurgeon for [employer], MD.,have documented all relevant documentation on the behalf of Ruth Ramp, MD,as directed by  Ruth Ramp, MD while in the presence of Ruth Ramp, MD.    Complete physical exam   Patient: Ruth Woods   DOB: [birth year]   57 y.o. Female  MRN: [id number] Visit Date: 05/22/2023  Today's healthcare provider: [name], MD   Chief Complaint  Patient presents with   Annual Exam   Subjective    Ruth Woods is a 57 y.o. female who presents today for a complete physical exam.   She reports consuming a general diet. Home exercise routine includes walking 1 hrs per week. She generally feels fairly well. She reports sleeping poorly. She does have additional problems to discuss today.   Tdap vaccine-declined  Shingles vaccine-declined Pap smear-reports done about one year ago. Requested from Ruth Woods Colonoscopy-six years ago in [place]. Cologuard-last year. Requested from [employer].    Night Sweats  Patient C/O night sweats x 2 weeks.  She reports feeling like she has a "fever" like she is ill. This only when she is active. She reports she has done 2 covid test and both have been negative. She also reports pulling off two ticks.   Discussed the use of AI scribe software for clinical note transcription with the patient, who gave verbal consent to proceed.  History of Present Illness   Tick Bites and Night Sweats  The patient, with a history of multiple sclerosis, presented for a physical and to discuss recent symptoms of night sweats and symptoms following two tick bites. The patient noticed the ticks two days prior to removal, but they were not engorged. The patient did not seek immediate medical attention, assuming any potential infection would be detected in the upcoming physical's blood work.  Around the same time, the patient  experienced abdominal tenderness on the same side as a previous diverticulitis attack. The patient managed this with a clear liquid diet for a few days, which resolved the pain. Despite these symptoms, the patient did not feel significantly unwell, but reported a general feeling of not being "right."  The patient described experiencing fever-like symptoms and night sweats, but only when active. These symptoms would subside upon resting. The patient also reported episodes of dizziness, feeling disoriented and "off," but denied any associated pain. The patient noted an increase in floaters in her vision, but overall vision remained stable.  The patient also reported a swollen gland under the right side of the chin, which has been present for the same duration as the other symptoms. There was also transient pain in the arm, which has since resolved. The patient denied any new joint pain, but reported more back pain than usual and new pain in the left wrist.  The patient has a history of shingles, with three previous episodes, and has high antibody levels to the virus. The patient declined the COVID-19 vaccine. The patient also has a history of a hysterectomy with one ovary remaining, and has previously experienced menopausal symptoms. The patient is due for a tetanus vaccine.       Past Medical History:  Diagnosis Date   Allergy 2006   Levaquin   Arthritis 2010   Carpal tunnel syndrome 10/08/2018   Collagen vascular disease (HCC)    Lupus (HCC)    Polyarthralgia 05/28/2019   Sleep apnea    Thyroid disease 2006   Past Surgical History:  Procedure Laterality Date  ABDOMINAL HYSTERECTOMY     CESAREAN SECTION  1992   CHOLECYSTECTOMY     COSMETIC SURGERY  1987   Social History   Socioeconomic History   Marital status: Married    Spouse name: Ruth Woods   Number of children: 2   Years of education: Not on file   Highest education level: Some college, no degree  Occupational History   Not on  file  Tobacco Use   Smoking status: Former    Packs/day: 1    Types: Cigarettes    Quit date: 11/20/2015    Years since quitting: 7.5   Smokeless tobacco: Never  Vaping Use   Vaping Use: Never used  Substance and Sexual Activity   Alcohol use: Yes    Alcohol/week: 1.0 standard drink of alcohol    Types: 1 Standard drinks or equivalent per week    Comment: social   Drug use: Not Currently   Sexual activity: Not Currently    Birth control/protection: Surgical  Other Topics Concern   Not on file  Social History Narrative   Lives at home with husband   R handed   Caffeine: 2 C of coffee a day   Social Determinants of Health   Financial Resource Strain: Not on file  Food Insecurity: Not on file  Transportation Needs: Not on file  Physical Activity: Not on file  Stress: Not on file  Social Connections: Not on file  Intimate Partner Violence: Not on file   Family Status  Relation Name Status   Mother Ruth Woods Alive   Father Ruth Woods Alive   Sister Ruth Woods Alive   Brother Ruth Woods Alive   Family History  Problem Relation Age of Onset   Diabetes Mother    Breast cancer Mother    Cancer Mother    Pancreatic cancer Father    Arthritis Father    Cancer Father    Breast cancer Sister    Thyroid cancer Sister    Cancer Sister    Skin cancer Brother    Diabetes Brother    Allergies  Allergen Reactions   Levofloxacin Shortness Of Breath    Other reaction(s): Respiratory Distress   Levaquin [Levofloxacin In D5w]    Penicillins Rash    Patient Care Team: Ruth Ramp, MD as PCP - General (Family Medicine) Ruth Evener, MD (Dermatology) Ruth Woods, Ruth Furl, MD (Neurology)   Medications: Outpatient Medications Prior to Visit  Medication Sig   HYDROcodone-acetaminophen (NORCO/VICODIN) 5-325 MG tablet Take 1 tablet by mouth 2 (two) times daily as needed for severe pain.   hydrOXYzine (ATARAX) 25 MG tablet Take 12.5-25 mg by mouth daily as needed.   meloxicam  (MOBIC) 15 MG tablet Take 1 tablet (15 mg total) by mouth daily at 6 (six) AM.   No facility-administered medications prior to visit.    Review of Systems  Constitutional:  Positive for activity change, diaphoresis, fatigue and fever.  Gastrointestinal:  Positive for abdominal distention.  Endocrine: Positive for heat intolerance.  Musculoskeletal:  Positive for back pain, myalgias and neck pain.  Neurological:  Positive for dizziness.  All other systems reviewed and are negative.   Last CBC Lab Results  Component Value Date   WBC 6.5 02/20/2023   HGB 13.6 02/20/2023   HCT 42.2 02/20/2023   MCV 80 02/20/2023   MCH 25.9 (L) 02/20/2023   RDW 14.1 02/20/2023   PLT 335 02/20/2023   Last metabolic panel Lab Results  Component Value Date   GLUCOSE 112 (H) 02/20/2023  NA 141 02/20/2023   K 4.7 02/20/2023   CL 104 02/20/2023   CO2 22 02/20/2023   BUN 14 02/20/2023   CREATININE 0.65 02/20/2023   EGFR 103 02/20/2023   CALCIUM 9.9 02/20/2023   PROT 7.2 02/20/2023   ALBUMIN 4.2 02/20/2023   LABGLOB 3.0 02/20/2023   AGRATIO 1.4 02/20/2023   BILITOT 0.4 02/20/2023   ALKPHOS 107 02/20/2023   AST 17 02/20/2023   ALT 26 02/20/2023   ANIONGAP 10 04/02/2022   Last thyroid functions Lab Results  Component Value Date   TSH 0.773 04/10/2023      Objective    BP 136/86 (BP Location: Left Arm, Patient Position: Sitting, Cuff Size: Large)   Pulse 79   Temp 97.6 F (36.4 C) (Temporal)   Resp 12   Ht 5\' 6"  (1.676 m)   Wt 266 lb 8 oz (120.9 kg)   SpO2 97%   BMI 43.01 kg/m  BP Readings from Last 3 Encounters:  05/22/23 136/86  04/10/23 124/70  02/20/23 119/78   Wt Readings from Last 3 Encounters:  05/22/23 266 lb 8 oz (120.9 kg)  04/10/23 264 lb (119.7 kg)  02/20/23 264 lb (119.7 kg)      Physical Exam   HEENT: Pupils good and reactive, moist mucus membranes, no oropharyngeal petechiae or erythema.  NECK: Subtle enlarged lymph node under chin.  CHEST: Lungs clear  to auscultation bilaterally, no wheezing, no crackles.  CARDIOVASCULAR: Heart rate regular, no murmurs, heart sounds normal.  ABDOMEN: Abdomen protuberant, soft, no fluid shifts, normal bowel sounds throughout, no tenderness.  MUSCULOSKELETAL: Bilateral upper extremities 5/5 strength with normal sensation, bilateral lower extremities 4/5 strength testing. No joint swelling nor erythema noted   NEUROLOGICAL: Pupils symmetric and reactive to light, normal extraocular movements bilaterally without scleral icterus, alert and oriented, speaking in complete sentences with normal behavior, normal eye contact, normal range of motion of upper and lower extremities.  SKIN: No rashes, specifically no visualization of erythema migraines or tigrotoid rashes, does not appear flushed in general appearance.       Last depression screening scores    05/22/2023    9:23 AM 02/20/2023    8:17 AM  PHQ 2/9 Scores  PHQ - 2 Score 2 1  PHQ- 9 Score 8     Last fall risk screening    05/22/2023    9:23 AM  Fall Risk   Falls in the past year? 1  Number falls in past yr: 1  Injury with Fall? 0  Risk for fall due to : History of fall(s)  Follow up Falls evaluation completed    Last Audit-C alcohol use screening    02/20/2023    8:18 AM  Alcohol Use Disorder Test (AUDIT)  1. How often do you have a drink containing alcohol? 1  2. How many drinks containing alcohol do you have on a typical day when you are drinking? 0  3. How often do you have six or more drinks on one occasion? 0  AUDIT-C Score 1   A score of 3 or more in women, and 4 or more in men indicates increased risk for alcohol abuse, EXCEPT if all of the points are from question 1   No results found for any visits on 05/22/23.  Assessment & Plan    Routine Health Maintenance and Physical Exam   There is no immunization history on file for this patient.  Health Maintenance  Topic Date Due   COVID-19 Vaccine (1)  Never done   Hepatitis C  Screening  Never done   DTaP/Tdap/Td (1 - Tdap) Never done   Zoster Vaccines- Shingrix (1 of 2) Never done   PAP SMEAR-Modifier  Never done   Colonoscopy  Never done   INFLUENZA VACCINE  06/20/2023   MAMMOGRAM  07/11/2024   HIV Screening  Completed   HPV VACCINES  Aged Out    Problem List Items Addressed This Visit     Obesity, Class III, BMI 40-49.9 (morbid obesity) (HCC)    Chronic  BMI 43.01 CMP, A1c,lipids ordered  Provided HO with recommendations to aim for 30 mins of physical activity daily and consuming well balanced meals and aiming for 6-8 bottles of water daily       Relevant Orders   Hemoglobin A1c   Multiple sclerosis (HCC)    Chronic   stable  Not currently on treatment  Follows with Neurology, Dr. Epimenio Foot Recommended follow up as recommended by specialist  No medications added for this problem today       Vitamin D deficiency    Chronic  Ordered vitamin D levels today       Relevant Orders   Vitamin D (25 hydroxy)   Petechiae    Chronic  CBC ordered today  No recent bleeding symptoms reported        Relevant Orders   CBC   Annual physical exam - Primary    Chronic conditions are stable  Patient was counseled on benefits of regular physical activity with goal of 150 minutes of moderate to vigurous intensity 4 days per week  Patient was counseled to consume well balanced diet of fruits, vegetables, limited saturated fats and limited sugary foods and beverages with emphasis on consuming 6-8 glasses of water daily  Screening recommended today: A1c, lipids,Hep C,HIV Vaccines recommended today: COVID,Shingrix, tetanus  will obtain records from previous providers for pap smear and colonoscopy results        Relevant Orders   Hepatitis C antibody   HIV Antibody (routine testing w rflx)   Lipid panel   Comprehensive metabolic panel   Healthcare maintenance    HIV and Hep C ordered       Morbid obesity (HCC)    Chronic BMI 43 Ordered CMP, A1c and  lipid panel       Relevant Orders   Lipid panel   Comprehensive metabolic panel   CBC   Hemoglobin A1c   Tick bite of left forearm    Acute  Experiencing fever and night sweats, will order lyme disease serology panel  Also ordered CBC        Relevant Orders   Lyme Disease Serology w/Reflex   CBC   Night sweats    Also reports cough for this acute problem with night sweats  Lyme serology ordered as well as Quant gold       Relevant Orders   QuantiFERON-TB Gold Plus   Other Visit Diagnoses     Screening for HIV (human immunodeficiency virus)       Relevant Orders   HIV Antibody (routine testing w rflx)   Encounter for hepatitis C screening test for low risk patient       Relevant Orders   Hepatitis C antibody   Screening for lipid disorders       Relevant Orders   Lipid panel        Return in about 3 months (around 08/22/2023) for chronic pain med f/u .  Ruth RampMakiera Simmons-Robinson, MD  [facility]  [facility] [phone] (phone) [phone] (fax)  [facility]

## 2023-05-22 ENCOUNTER — Ambulatory Visit (INDEPENDENT_AMBULATORY_CARE_PROVIDER_SITE_OTHER): Payer: 59 | Admitting: Family Medicine

## 2023-05-22 ENCOUNTER — Encounter: Payer: Self-pay | Admitting: Family Medicine

## 2023-05-22 VITALS — BP 136/86 | HR 79 | Temp 97.6°F | Resp 12 | Ht 66.0 in | Wt 266.5 lb

## 2023-05-22 DIAGNOSIS — E66813 Obesity, class 3: Secondary | ICD-10-CM

## 2023-05-22 DIAGNOSIS — Z0000 Encounter for general adult medical examination without abnormal findings: Secondary | ICD-10-CM | POA: Diagnosis not present

## 2023-05-22 DIAGNOSIS — E559 Vitamin D deficiency, unspecified: Secondary | ICD-10-CM | POA: Diagnosis not present

## 2023-05-22 DIAGNOSIS — W57XXXA Bitten or stung by nonvenomous insect and other nonvenomous arthropods, initial encounter: Secondary | ICD-10-CM | POA: Diagnosis not present

## 2023-05-22 DIAGNOSIS — Z13220 Encounter for screening for lipoid disorders: Secondary | ICD-10-CM | POA: Diagnosis not present

## 2023-05-22 DIAGNOSIS — Z1159 Encounter for screening for other viral diseases: Secondary | ICD-10-CM

## 2023-05-22 DIAGNOSIS — R61 Generalized hyperhidrosis: Secondary | ICD-10-CM | POA: Insufficient documentation

## 2023-05-22 DIAGNOSIS — Z114 Encounter for screening for human immunodeficiency virus [HIV]: Secondary | ICD-10-CM | POA: Diagnosis not present

## 2023-05-22 DIAGNOSIS — S50862A Insect bite (nonvenomous) of left forearm, initial encounter: Secondary | ICD-10-CM | POA: Diagnosis not present

## 2023-05-22 DIAGNOSIS — R233 Spontaneous ecchymoses: Secondary | ICD-10-CM

## 2023-05-22 DIAGNOSIS — G35 Multiple sclerosis: Secondary | ICD-10-CM | POA: Diagnosis not present

## 2023-05-22 NOTE — Assessment & Plan Note (Signed)
HIV and Hep C ordered

## 2023-05-22 NOTE — Assessment & Plan Note (Signed)
Chronic conditions are stable  Patient was counseled on benefits of regular physical activity with goal of 150 minutes of moderate to vigurous intensity 4 days per week  Patient was counseled to consume well balanced diet of fruits, vegetables, limited saturated fats and limited sugary foods and beverages with emphasis on consuming 6-8 glasses of water daily  Screening recommended today: A1c, lipids,Hep C,HIV Vaccines recommended today: COVID,Shingrix, tetanus  will obtain records from previous providers for pap smear and colonoscopy results

## 2023-05-22 NOTE — Assessment & Plan Note (Signed)
Acute  Experiencing fever and night sweats, will order lyme disease serology panel  Also ordered CBC

## 2023-05-22 NOTE — Assessment & Plan Note (Signed)
Chronic   stable  Not currently on treatment  Follows with Neurology, Dr. Epimenio Foot Recommended follow up as recommended by specialist  No medications added for this problem today

## 2023-05-22 NOTE — Assessment & Plan Note (Signed)
Chronic  Ordered vitamin D levels today

## 2023-05-22 NOTE — Patient Instructions (Signed)
We have ordered routine blood work, including tests for CBC, A1C, vitamin D levels, cholesterol, liver function, and electrolytes.   We will also screen for Hepatitis C and HIV.   We recommend that you get a tetanus vaccine, as it has been over five years since your last dose.   Please schedule a follow-up appointment in three months for chronic medication management.

## 2023-05-22 NOTE — Assessment & Plan Note (Signed)
Also reports cough for this acute problem with night sweats  Lyme serology ordered as well as Quant gold

## 2023-05-22 NOTE — Assessment & Plan Note (Signed)
Chronic  CBC ordered today  No recent bleeding symptoms reported

## 2023-05-22 NOTE — Assessment & Plan Note (Signed)
Chronic  BMI 43.01 CMP, A1c,lipids ordered  Provided HO with recommendations to aim for 30 mins of physical activity daily and consuming well balanced meals and aiming for 6-8 bottles of water daily

## 2023-05-22 NOTE — Assessment & Plan Note (Signed)
Chronic BMI 43 Ordered CMP, A1c and lipid panel

## 2023-05-23 LAB — COMPREHENSIVE METABOLIC PANEL
ALT: 26 IU/L (ref 0–32)
AST: 15 IU/L (ref 0–40)
Albumin: 4.3 g/dL (ref 3.8–4.9)
Alkaline Phosphatase: 104 IU/L (ref 44–121)
BUN/Creatinine Ratio: 15 (ref 9–23)
BUN: 10 mg/dL (ref 6–24)
Bilirubin Total: 0.3 mg/dL (ref 0.0–1.2)
CO2: 24 mmol/L (ref 20–29)
Calcium: 9.7 mg/dL (ref 8.7–10.2)
Chloride: 104 mmol/L (ref 96–106)
Creatinine, Ser: 0.66 mg/dL (ref 0.57–1.00)
Globulin, Total: 2.5 g/dL (ref 1.5–4.5)
Glucose: 120 mg/dL — ABNORMAL HIGH (ref 70–99)
Potassium: 4.5 mmol/L (ref 3.5–5.2)
Sodium: 142 mmol/L (ref 134–144)
Total Protein: 6.8 g/dL (ref 6.0–8.5)
eGFR: 102 mL/min/{1.73_m2} (ref >59)

## 2023-05-23 LAB — CBC
Hematocrit: 41.8 % (ref 34.0–46.6)
Hemoglobin: 13.7 g/dL (ref 11.1–15.9)
MCH: 26.7 pg (ref 26.6–33.0)
MCHC: 32.8 g/dL (ref 31.5–35.7)
MCV: 81 fL (ref 79–97)
Platelets: 311 10*3/uL (ref 150–450)
RBC: 5.14 x10E6/uL (ref 3.77–5.28)
RDW: 13.9 % (ref 11.7–15.4)
WBC: 6.7 10*3/uL (ref 3.4–10.8)

## 2023-05-23 LAB — LIPID PANEL
Chol/HDL Ratio: 3.8 ratio (ref 0.0–4.4)
Cholesterol, Total: 208 mg/dL — ABNORMAL HIGH (ref 100–199)
HDL: 55 mg/dL (ref >39)
LDL Chol Calc (NIH): 109 mg/dL — ABNORMAL HIGH (ref 0–99)
Triglycerides: 259 mg/dL — ABNORMAL HIGH (ref 0–149)
VLDL Cholesterol Cal: 44 mg/dL — ABNORMAL HIGH (ref 5–40)

## 2023-05-23 LAB — HIV ANTIBODY (ROUTINE TESTING W REFLEX): HIV Screen 4th Generation wRfx: NONREACTIVE

## 2023-05-23 LAB — HEPATITIS C ANTIBODY: Hep C Virus Ab: NONREACTIVE

## 2023-05-23 LAB — HEMOGLOBIN A1C
Est. average glucose Bld gHb Est-mCnc: 123 mg/dL
Hgb A1c MFr Bld: 5.9 % — ABNORMAL HIGH (ref 4.8–5.6)

## 2023-05-23 LAB — VITAMIN D 25 HYDROXY (VIT D DEFICIENCY, FRACTURES): Vit D, 25-Hydroxy: 31.6 ng/mL (ref 30.0–100.0)

## 2023-05-24 ENCOUNTER — Other Ambulatory Visit: Payer: Self-pay | Admitting: Family Medicine

## 2023-05-24 LAB — QUANTIFERON-TB GOLD PLUS

## 2023-05-29 LAB — QUANTIFERON-TB GOLD PLUS
QuantiFERON Mitogen Value: >10 IU/mL
QuantiFERON TB1 Ag Value: 0.05 IU/mL
QuantiFERON TB2 Ag Value: 0.05 IU/mL
QuantiFERON-TB Gold Plus: NEGATIVE

## 2023-06-04 ENCOUNTER — Other Ambulatory Visit: Payer: Self-pay | Admitting: Family Medicine

## 2023-06-04 ENCOUNTER — Encounter: Payer: Self-pay | Admitting: Family Medicine

## 2023-06-04 DIAGNOSIS — M150 Primary generalized (osteo)arthritis: Secondary | ICD-10-CM

## 2023-06-04 DIAGNOSIS — R5383 Other fatigue: Secondary | ICD-10-CM

## 2023-06-04 DIAGNOSIS — M159 Polyosteoarthritis, unspecified: Secondary | ICD-10-CM

## 2023-06-04 DIAGNOSIS — S50862A Insect bite (nonvenomous) of left forearm, initial encounter: Secondary | ICD-10-CM

## 2023-06-04 DIAGNOSIS — R61 Generalized hyperhidrosis: Secondary | ICD-10-CM

## 2023-06-04 DIAGNOSIS — R233 Spontaneous ecchymoses: Secondary | ICD-10-CM

## 2023-06-04 DIAGNOSIS — W57XXXA Bitten or stung by nonvenomous insect and other nonvenomous arthropods, initial encounter: Secondary | ICD-10-CM

## 2023-06-05 DIAGNOSIS — S50862A Insect bite (nonvenomous) of left forearm, initial encounter: Secondary | ICD-10-CM | POA: Diagnosis not present

## 2023-06-05 DIAGNOSIS — W57XXXA Bitten or stung by nonvenomous insect and other nonvenomous arthropods, initial encounter: Secondary | ICD-10-CM | POA: Diagnosis not present

## 2023-06-05 DIAGNOSIS — R5383 Other fatigue: Secondary | ICD-10-CM | POA: Diagnosis not present

## 2023-06-05 DIAGNOSIS — R61 Generalized hyperhidrosis: Secondary | ICD-10-CM | POA: Diagnosis not present

## 2023-06-05 DIAGNOSIS — R233 Spontaneous ecchymoses: Secondary | ICD-10-CM | POA: Diagnosis not present

## 2023-06-06 LAB — LYME DISEASE SEROLOGY W/REFLEX: Lyme Total Antibody EIA: NEGATIVE

## 2023-06-17 ENCOUNTER — Other Ambulatory Visit: Payer: Self-pay | Admitting: Family Medicine

## 2023-06-17 MED ORDER — HYDROCODONE-ACETAMINOPHEN 5-325 MG PO TABS: 1.0000 | ORAL_TABLET | Freq: Two times a day (BID) | ORAL | 0 refills | Status: DC | PRN

## 2023-06-20 DIAGNOSIS — Z419 Encounter for procedure for purposes other than remedying health state, unspecified: Secondary | ICD-10-CM | POA: Diagnosis not present

## 2023-07-02 ENCOUNTER — Ambulatory Visit
Admission: RE | Admit: 2023-07-02 | Discharge: 2023-07-02 | Disposition: A | Discharge status: Home / Self Care | Payer: 59 | Source: Ambulatory Visit | Attending: Internal Medicine | Admitting: Internal Medicine

## 2023-07-02 ENCOUNTER — Other Ambulatory Visit (HOSPITAL_COMMUNITY)
Admission: RE | Admit: 2023-07-02 | Discharge: 2023-07-02 | Disposition: A | Discharge status: Home / Self Care | Payer: 59 | Source: Ambulatory Visit | Attending: Interventional Radiology | Admitting: Interventional Radiology

## 2023-07-02 DIAGNOSIS — E042 Nontoxic multinodular goiter: Secondary | ICD-10-CM | POA: Diagnosis not present

## 2023-07-02 DIAGNOSIS — E041 Nontoxic single thyroid nodule: Secondary | ICD-10-CM | POA: Diagnosis not present

## 2023-07-21 DIAGNOSIS — Z419 Encounter for procedure for purposes other than remedying health state, unspecified: Secondary | ICD-10-CM | POA: Diagnosis not present

## 2023-07-26 ENCOUNTER — Other Ambulatory Visit: Payer: Self-pay | Admitting: Family Medicine

## 2023-07-26 DIAGNOSIS — M48061 Spinal stenosis, lumbar region without neurogenic claudication: Secondary | ICD-10-CM

## 2023-07-26 DIAGNOSIS — M159 Polyosteoarthritis, unspecified: Secondary | ICD-10-CM

## 2023-07-29 MED ORDER — HYDROCODONE-ACETAMINOPHEN 5-325 MG PO TABS
1.0000 | ORAL_TABLET | Freq: Two times a day (BID) | ORAL | 0 refills | Status: AC | PRN
Start: 2023-07-29 — End: 2023-08-03

## 2023-07-31 ENCOUNTER — Encounter: Payer: Self-pay | Admitting: Physician Assistant

## 2023-07-31 ENCOUNTER — Ambulatory Visit: Payer: Self-pay

## 2023-07-31 ENCOUNTER — Ambulatory Visit (INDEPENDENT_AMBULATORY_CARE_PROVIDER_SITE_OTHER): Payer: 59 | Admitting: Physician Assistant

## 2023-07-31 VITALS — BP 146/70 | HR 84 | Ht 66.0 in | Wt 268.8 lb

## 2023-07-31 DIAGNOSIS — H6993 Unspecified Eustachian tube disorder, bilateral: Secondary | ICD-10-CM | POA: Diagnosis not present

## 2023-07-31 DIAGNOSIS — F4321 Adjustment disorder with depressed mood: Secondary | ICD-10-CM | POA: Diagnosis not present

## 2023-07-31 DIAGNOSIS — I10 Essential (primary) hypertension: Secondary | ICD-10-CM | POA: Diagnosis not present

## 2023-07-31 DIAGNOSIS — J324 Chronic pansinusitis: Secondary | ICD-10-CM

## 2023-07-31 DIAGNOSIS — F4323 Adjustment disorder with mixed anxiety and depressed mood: Secondary | ICD-10-CM | POA: Insufficient documentation

## 2023-07-31 MED ORDER — AZITHROMYCIN 250 MG PO TABS
ORAL_TABLET | ORAL | 0 refills | Status: AC
Start: 2023-07-31 — End: 2023-08-05

## 2023-07-31 NOTE — Progress Notes (Signed)
Established patient visit  Patient: Ruth Woods   DOB: Mar 26, 1966   57 y.o. Female  MRN: [id number] Visit Date: 07/31/2023  Today's healthcare provider: Debera Lat, PA-C   Chief Complaint  Patient presents with   Medical Management of Chronic Issues    Headache, possible sinus infection, pressure in the eyes, has been going on since July 2024   Subjective     Discussed the use of AI scribe software for clinical note transcription with the patient, who gave verbal consent to proceed.  History of Present Illness   The patient, with a history of sinus infections and facial surgery, presents with a suspected sinus infection that has been ongoing for approximately two months. She reports increased sinus pressure and pain, which has become more severe over time. She also describes a crackling sound and discomfort in one ear. The patient has been managing her symptoms with salt water gargles, saline spray, and increased fluid intake, but reports no improvement. She has not been taking any antihistamines due to previous negative experiences with Flonase. The patient also mentions feeling unwell and depressed, which she attributes to her ongoing sinus issues and the recent passing of her father. She has a history of responding well to Z-Pak (azithromycin) treatment for sinus infections.           07/31/2023    9:50 AM 05/22/2023    9:23 AM 02/20/2023    8:17 AM  Depression screen PHQ 2/9  Decreased Interest 3 1 0  Down, Depressed, Hopeless 2 1 1   PHQ - 2 Score 5 2 1   Altered sleeping 3 1   Tired, decreased energy 3 1   Change in appetite 0 3   Feeling bad or failure about yourself  1 0   Trouble concentrating 2 1   Moving slowly or fidgety/restless 0 0   Suicidal thoughts 0 0   PHQ-9 Score 14 8   Difficult doing work/chores  Somewhat difficult       07/31/2023    9:49 AM  GAD 7 : Generalized Anxiety Score  Nervous, Anxious, on Edge 0  Control/stop worrying 0  Worry too  much - different things 1  Trouble relaxing 1  Restless 0  Easily annoyed or irritable 3  Afraid - awful might happen 0  Total GAD 7 Score 5    Medications: Outpatient Medications Prior to Visit  Medication Sig   HYDROcodone-acetaminophen (NORCO/VICODIN) 5-325 MG tablet Take 1 tablet by mouth 2 (two) times daily as needed for up to 5 days for severe pain.   hydrOXYzine (ATARAX) 25 MG tablet Take 12.5-25 mg by mouth daily as needed.   meloxicam (MOBIC) 15 MG tablet Take 1 tablet (15 mg total) by mouth daily at 6 (six) AM.   No facility-administered medications prior to visit.    Review of Systems  All other systems reviewed and are negative.  Except see HPI       Objective    BP (!) 146/70 (BP Location: Left Arm, Patient Position: Sitting, Cuff Size: Large)   Pulse 84   Ht 5\' 6"  (1.676 m)   Wt 268 lb 12.8 oz (121.9 kg)   SpO2 98%   BMI 43.39 kg/m     Physical Exam Vitals reviewed.  Constitutional:      General: She is not in acute distress.    Appearance: Normal appearance. She is well-developed. She is obese. She is not diaphoretic.  HENT:     Head: Normocephalic and atraumatic.  Right Ear: Ear canal and external ear normal.     Left Ear: Ear canal and external ear normal.     Ears:     Comments: Fluids behind TMs    Nose: Congestion (mild) and rhinorrhea (mild) present.     Mouth/Throat:     Comments: Postnasal drainage Eyes:     General: No scleral icterus.       Right eye: Discharge present.        Left eye: Discharge present.    Extraocular Movements: Extraocular movements intact.     Conjunctiva/sclera: Conjunctivae normal.     Pupils: Pupils are equal, round, and reactive to light.  Neck:     Thyroid: No thyromegaly.  Cardiovascular:     Rate and Rhythm: Normal rate and regular rhythm.     Pulses: Normal pulses.     Heart sounds: Normal heart sounds. No murmur heard. Pulmonary:     Effort: Pulmonary effort is normal. No respiratory distress.      Breath sounds: Normal breath sounds. No wheezing, rhonchi or rales.  Musculoskeletal:     Cervical back: Normal range of motion and neck supple. Tenderness present.     Right lower leg: No edema.     Left lower leg: No edema.  Lymphadenopathy:     Cervical: Cervical adenopathy present.  Skin:    General: Skin is warm and dry.     Findings: No rash.  Neurological:     Mental Status: She is alert and oriented to person, place, and time. Mental status is at baseline.  Psychiatric:        Mood and Affect: Mood normal.        Behavior: Behavior normal.      No results found for any visits on 07/31/23.  Assessment & Plan        Sinusitis Chronic symptoms with recent worsening. Patient has been using saline spray and gargling with salt water. No improvement with these measures. Patient has a history of sinus issues post facial surgery. Continue symptomatic treatment -Add Allegra to current regimen and reassess in two days. -Start Azithromycin (Z-Pak) if symptoms do not improve with addition of Allegra. Patient was instructed not to fill it unless Her symptoms worsen   Discussed the potential negative side effects of antibiotics and that they can lead to resistance if used unnecessarily Discussed expectations for duration of symptoms.  Explained that you can get multiple viruses in a row and recommend prevention strategies such as hand washing Made a plan with the patient to outline what to do if symptoms worsen or do not improve, or if they develop concerning symptoms like high fever, SOB,-    Eustachian Tube Dysfunction Patient reports discomfort and crackling sounds in the ear. No pain reported. -Observe and reassess after treatment for sinusitis.  In the setting of Adjustment disorder Grief Patient reports mood changes and irritability. History of using Alprazolam. -Consider counseling services.  Hypertension Elevated blood pressure noted during visit. Patient reports  higher readings at home during headache episodes. -Monitor blood pressure daily for two weeks. -Return for follow-up if readings consistently above 140/90. Advised low salt diet and exercise as tolerated  Hyperlipidemia Chronic Patient reports high cholesterol. -Continue lifestyle modifications including diet and exercise.  Thyroid Nodule Recent biopsy performed. -Continue current management and follow-up as planned. Will monitor  Pain Management Patient reports use of Hydroxyzine and Norco for pain. -Continue current regimen.  General Health Maintenance -Continue hydration  -Avoid triggers such as  caffeine, alcohol, and cigarette smoke.     No follow-ups on file.     The patient was advised to call back or seek an in-person evaluation if the symptoms worsen or if the condition fails to improve as anticipated.  I discussed the assessment and treatment plan with the patient. The patient was provided an opportunity to ask questions and all were answered. The patient agreed with the plan and demonstrated an understanding of the instructions.  I, Debera Lat, PA-C have reviewed all documentation for this visit. The documentation on  07/31/23 for the exam, diagnosis, procedures, and orders are all accurate and complete.  Debera Lat, [facility], MMS [facility] [phone] (phone) [phone] (fax)  [facility]

## 2023-07-31 NOTE — Telephone Encounter (Signed)
    Chief Complaint: Headache, face between eyes. "I think it's my sinuses." Symptoms: Above Frequency: 2 months Pertinent Negatives: Patient denies  Disposition: [] ED /[] Urgent Care (no appt availability in office) / [x] Appointment(In office/virtual)/ []  [facility]/ [] Home Care/ [] Refused Recommended Disposition /[] [facility]/ []  Follow-up with PCP Additional Notes: Pt. Agrees with appointment today.  Reason for Disposition  [1] SEVERE headache (e.g., excruciating) AND [2] not improved after 2 hours of pain medicine  Answer Assessment - Initial Assessment Questions 1. LOCATION: "Where does it hurt?"      Face, between eyes 2. ONSET: "When did the headache start?" (Minutes, hours or days)      July 3. PATTERN: "Does the pain come and go, or has it been constant since it started?"     Constant 4. SEVERITY: "How bad is the pain?" and "What does it keep you from doing?"  (e.g., Scale 1-10; mild, moderate, or severe)   - MILD (1-3): doesn't interfere with normal activities    - MODERATE (4-7): interferes with normal activities or awakens from sleep    - SEVERE (8-10): excruciating pain, unable to do any normal activities        Severe 5. RECURRENT SYMPTOM: "Have you ever had headaches before?" If Yes, ask: "When was the last time?" and "What happened that time?"      Yes 6. CAUSE: "What do you think is causing the headache?"     Unsure 7. MIGRAINE: "Have you been diagnosed with migraine headaches?" If Yes, ask: "Is this headache similar?"      No 8. HEAD INJURY: "Has there been any recent injury to the head?"      No 9. OTHER SYMPTOMS: "Do you have any other symptoms?" (fever, stiff neck, eye pain, sore throat, cold symptoms)     No 10. PREGNANCY: "Is there any chance you are pregnant?" "When was your last menstrual period?"       No  Protocols used: Headache-A-AH

## 2023-08-10 ENCOUNTER — Encounter: Payer: Self-pay | Admitting: Family Medicine

## 2023-08-20 DIAGNOSIS — Z419 Encounter for procedure for purposes other than remedying health state, unspecified: Secondary | ICD-10-CM | POA: Diagnosis not present

## 2023-08-20 NOTE — Progress Notes (Unsigned)
      Established patient visit   Patient: Ruth Woods   DOB: 1966/05/01   57 y.o. Female  MRN: [id number] Visit Date: 08/22/2023  Today's healthcare provider: [name], MD   No chief complaint on file.  Subjective       Discussed the use of AI scribe software for clinical note transcription with the patient, who gave verbal consent to proceed.  History of Present Illness             Past Medical History:  Diagnosis Date   Allergy 2006   Levaquin   Arthritis 2010   Carpal tunnel syndrome 10/08/2018   Collagen vascular disease (HCC)    Lupus (HCC)    Polyarthralgia 05/28/2019   Sleep apnea    Thyroid disease 2006    Medications: Outpatient Medications Prior to Visit  Medication Sig   hydrOXYzine (ATARAX) 25 MG tablet Take 12.5-25 mg by mouth daily as needed.   meloxicam (MOBIC) 15 MG tablet Take 1 tablet (15 mg total) by mouth daily at 6 (six) AM.   No facility-administered medications prior to visit.    Review of Systems  {Insert previous labs (optional):23779} {See past labs  Heme  Chem  Endocrine  Serology  Results Review (optional):1}   Objective    There were no vitals taken for this visit. {Insert last BP/Wt (optional):23777}{See vitals history (optional):1}   Physical Exam  ***  No results found for any visits on 08/22/23.  Assessment & Plan     Problem List Items Addressed This Visit   None   Assessment and Plan              No follow-ups on file.         [name], MD  [facility] [phone] (phone) [phone] (fax)  [facility]

## 2023-08-22 ENCOUNTER — Ambulatory Visit (INDEPENDENT_AMBULATORY_CARE_PROVIDER_SITE_OTHER): Payer: 59 | Admitting: Family Medicine

## 2023-08-22 VITALS — BP 140/76 | HR 83 | Ht 66.0 in | Wt 268.4 lb

## 2023-08-22 DIAGNOSIS — G35 Multiple sclerosis: Secondary | ICD-10-CM

## 2023-08-22 DIAGNOSIS — M150 Primary generalized (osteo)arthritis: Secondary | ICD-10-CM

## 2023-08-22 DIAGNOSIS — E042 Nontoxic multinodular goiter: Secondary | ICD-10-CM | POA: Diagnosis not present

## 2023-08-22 DIAGNOSIS — M48061 Spinal stenosis, lumbar region without neurogenic claudication: Secondary | ICD-10-CM | POA: Diagnosis not present

## 2023-08-22 DIAGNOSIS — F4323 Adjustment disorder with mixed anxiety and depressed mood: Secondary | ICD-10-CM

## 2023-08-22 DIAGNOSIS — I10 Essential (primary) hypertension: Secondary | ICD-10-CM

## 2023-08-22 DIAGNOSIS — G35D Multiple sclerosis, unspecified: Secondary | ICD-10-CM

## 2023-08-22 MED ORDER — HYDROCODONE-ACETAMINOPHEN 5-325 MG PO TABS: 1.0000 | ORAL_TABLET | Freq: Four times a day (QID) | ORAL | 0 refills | Status: DC | PRN

## 2023-08-22 MED ORDER — MELOXICAM 15 MG PO TABS
15.0000 mg | ORAL_TABLET | Freq: Every day | ORAL | 1 refills | Status: AC
Start: 2023-08-22 — End: ?

## 2023-08-22 NOTE — Assessment & Plan Note (Signed)
  Patient reports persistent muscle pain and tension, not relieved by massage or previous trials of fibromyalgia medications. Patient is currently taking Norco 5/325mg  and Meloxicam 15mg  daily for pain management. Chronic, intermittent problem, improved on pain regimen  -Continue Norco 5/325mg  (1-2tabs) every 6 hours PRN and Meloxicam 15mg  daily. -Refilled Norco and Meloxicam prescriptions. -Implement chronic opioid contract due to ongoing use of Norco. -agreement signed today, will be scanned into chart

## 2023-08-22 NOTE — Assessment & Plan Note (Signed)
Patient reports persistent frontal headaches, worse with light and movement. Patient has been non-compliant with Vumerity due to adverse reactions including rash and cognitive changes. Patient plans to follow up with neurologist Dr. Epimenio Foot. Chronic -no current medications for this problem, has had negative SE to treatment option in the past  -Encouraged patient to follow up with neurologist for further management and medication adjustment.

## 2023-08-22 NOTE — Assessment & Plan Note (Signed)
Patient reports feelings of depression related to chronic illness and functional limitations. Patient has joined an online support group and reports some improvement in mood. -Encourage continued participation in support group and open communication about mood changes.

## 2023-08-22 NOTE — Assessment & Plan Note (Signed)
Chronic  On chronic narcotics along with NSAIDS for pain management  PDMP reviewed  Will refill medications including meloxicam 15mg  daily and norco (5-10)-325mg  BID PRN

## 2023-08-22 NOTE — Assessment & Plan Note (Signed)
Patient reports a growing goiter and difficulty swallowing. Previous thyroid biopsy was inconclusive and patient has a family history of thyroid cancer. Patient has an upcoming appointment with an endocrinologist. -Order thyroid function tests (TSH, free T4, free T3) and CBC to assess for possible thyroiditis. -Encourage patient to discuss concerns and family history with endocrinologist at upcoming appointment.

## 2023-08-23 LAB — CBC
Hematocrit: 40.7 % (ref 34.0–46.6)
Hemoglobin: 13.5 g/dL (ref 11.1–15.9)
MCH: 26.8 pg (ref 26.6–33.0)
MCHC: 33.2 g/dL (ref 31.5–35.7)
MCV: 81 fL (ref 79–97)
Platelets: 297 10*3/uL (ref 150–450)
RBC: 5.03 x10E6/uL (ref 3.77–5.28)
RDW: 14.1 % (ref 11.7–15.4)
WBC: 6.4 10*3/uL (ref 3.4–10.8)

## 2023-08-23 LAB — TSH+T4F+T3FREE
Free T4: 1.33 ng/dL (ref 0.82–1.77)
T3, Free: 3.6 pg/mL (ref 2.0–4.4)
TSH: 0.536 u[IU]/mL (ref 0.450–4.500)

## 2023-09-20 DIAGNOSIS — Z419 Encounter for procedure for purposes other than remedying health state, unspecified: Secondary | ICD-10-CM | POA: Diagnosis not present

## 2023-09-25 ENCOUNTER — Other Ambulatory Visit: Payer: Self-pay | Admitting: Family Medicine

## 2023-09-25 DIAGNOSIS — M150 Primary generalized (osteo)arthritis: Secondary | ICD-10-CM

## 2023-09-25 DIAGNOSIS — M48061 Spinal stenosis, lumbar region without neurogenic claudication: Secondary | ICD-10-CM

## 2023-09-27 MED ORDER — HYDROCODONE-ACETAMINOPHEN 5-325 MG PO TABS
1.0000 | ORAL_TABLET | Freq: Four times a day (QID) | ORAL | 0 refills | Status: DC | PRN
Start: 2023-09-27 — End: 2023-10-26

## 2023-10-01 ENCOUNTER — Ambulatory Visit (INDEPENDENT_AMBULATORY_CARE_PROVIDER_SITE_OTHER): Payer: 59 | Admitting: Internal Medicine

## 2023-10-01 ENCOUNTER — Encounter: Payer: Self-pay | Admitting: Internal Medicine

## 2023-10-01 VITALS — BP 126/78 | HR 88 | Ht 66.0 in | Wt 269.0 lb

## 2023-10-01 DIAGNOSIS — E042 Nontoxic multinodular goiter: Secondary | ICD-10-CM | POA: Diagnosis not present

## 2023-10-01 NOTE — Progress Notes (Unsigned)
Name: Lionel Eyerman  MRN/ DOB: [id number], 08-Apr-1966    Age/ Sex: 57 y.o., female    PCP: [name], MD   Reason for Endocrinology Evaluation: MNG     Date of Initial Endocrinology Evaluation: 04/10/2023    HPI: Ms. Shanine Bazen is a 57 y.o. female with a past medical history of MNG, fibromyalgia and MS . The patient presented for initial endocrinology clinic visit on 04/10/2023 for consultative assistance with her thyromegaly.   Patient has been diagnosed with multinodular goiter many years ago.  She had a reported left thyroid nodule FNA in 2011 that was reported as inconclusive  ? FLUS (records not available) while living in [place]  She is s/p benign FNA of right thyroid nodule with benign cytology in 08/2018, these records are available under media   An MRI of the brain 03/2022 continued to show enlarged left thyroid lobe   She had TFTs through her PCPs office 08/2022 with normal TSH, total T4, she also had undetectable thyroglobulin antibodies as well as an detectable thyroid peroxidase antibodies.    Mother S/P   thyroidectomy follicular cancer and sister follicular and papillary cancer    In reviewing her records she had a left nodule FNA 01/2010 which was inconclusive  She is s/p right superior nodule FNA with scant epithelium 06/2023 S/p benign FNA of the isthmic nodule  SUBJECTIVE:    Today (10/01/23):  Delaylah Holling Marucci is here for follow-up on multinodular goiter.   Local neck swelling has been stable  Has chronic dysphagia especially to pills and food  Denies palpitations  Continues with occasional tremors that she attributes with MS  Continue with  chronic diarrhea  She did not tolerate med for MS     HISTORY:  Past Medical History:  Past Medical History:  Diagnosis Date   Allergy 2006   Levaquin   Arthritis 2010   Carpal tunnel syndrome 10/08/2018   Collagen vascular disease (HCC)    Lupus    Polyarthralgia  05/28/2019   Sleep apnea    Thyroid disease 2006   Past Surgical History:  Past Surgical History:  Procedure Laterality Date   ABDOMINAL HYSTERECTOMY     CESAREAN SECTION  1992   CHOLECYSTECTOMY     COSMETIC SURGERY  1987    Social History:  reports that she quit smoking about 7 years ago. Her smoking use included cigarettes. She has never used smokeless tobacco. She reports current alcohol use of about 1.0 standard drink of alcohol per week. She reports that she does not currently use drugs. Family History: family history includes Arthritis in her father; Breast cancer in her mother and sister; Cancer in her father, mother, and sister; Diabetes in her brother and mother; Pancreatic cancer in her father; Skin cancer in her brother; Thyroid cancer in her sister.   HOME MEDICATIONS: Allergies as of 10/01/2023       Reactions   Levofloxacin Shortness Of Breath   Other reaction(s): Respiratory Distress   Levaquin [levofloxacin In D5w]    Penicillins Rash        Medication List        Accurate as of October 01, 2023  9:53 AM. If you have any questions, ask your nurse or doctor.          Norco 5-325 MG tablet Generic drug: HYDROcodone-acetaminophen   HYDROcodone-acetaminophen 5-325 MG tablet Commonly known as: NORCO/VICODIN Take 1-2 tablets by mouth every 6 (six) hours as needed for moderate  pain (pain score 4-6). Take 1-2 times daily as needed   hydrOXYzine 25 MG tablet Commonly known as: ATARAX Take 12.5-25 mg by mouth daily as needed.   meloxicam 15 MG tablet Commonly known as: MOBIC Take 1 tablet (15 mg total) by mouth daily at 6 (six) AM.          REVIEW OF SYSTEMS: A comprehensive ROS was conducted with the patient and is negative except as per HPI     OBJECTIVE:  VS: BP 126/78 (BP Location: Left Arm, Patient Position: Sitting, Cuff Size: Large)   Pulse 88   Ht 5\' 6"  (1.676 m)   Wt 269 lb (122 kg)   SpO2 98%   BMI 43.42 kg/m    Wt Readings from  Last 3 Encounters:  10/01/23 269 lb (122 kg)  08/22/23 268 lb 6.4 oz (121.7 kg)  07/31/23 268 lb 12.8 oz (121.9 kg)     EXAM: General: Pt appears well and is in NAD  Neck: General: Supple without adenopathy. Thyroid: Thyroid asymmetry noted on exam today  Lungs: Clear with good BS bilat   Heart: Auscultation: RRR.  Abdomen: Soft, nontender  Extremities:  BL LE: No pretibial edema   Mental Status: Judgment, insight: Intact Orientation: Oriented to time, place, and person Mood and affect: No depression, anxiety, or agitation     DATA REVIEWED:  Latest Reference Range & Units 08/22/23 09:10  TSH 0.450 - 4.500 uIU/mL 0.536  Triiodothyronine,Free,Serum 2.0 - 4.4 pg/mL 3.6  T4,Free(Direct) 0.82 - 1.77 ng/dL 9.52      Latest Reference Range & Units 02/20/23 09:30  Sodium 134 - 144 mmol/L 141  Potassium 3.5 - 5.2 mmol/L 4.7  Chloride 96 - 106 mmol/L 104  CO2 20 - 29 mmol/L 22  Glucose 70 - 99 mg/dL 841 (H)  BUN 6 - 24 mg/dL 14  Creatinine 3.24 - 4.01 mg/dL 0.27  Calcium 8.7 - 25.3 mg/dL 9.9  BUN/Creatinine Ratio 9 - 23  22  eGFR >59 mL/min/1.73 103  Alkaline Phosphatase 44 - 121 IU/L 107  Albumin 3.8 - 4.9 g/dL 4.2  Albumin/Globulin Ratio 1.2 - 2.2  1.4  AST 0 - 40 IU/L 17  ALT 0 - 32 IU/L 26  Total Protein 6.0 - 8.5 g/dL 7.2  Total Bilirubin 0.0 - 1.2 mg/dL 0.4   Old records , labs and images have been reviewed.    Thyroid Ultrasound 04/30/2023   Estimated total number of nodules >/= 1 cm: 0   Number of spongiform nodules >/=  2 cm not described below (TR1): 0   Number of mixed cystic and solid nodules >/= 1.5 cm not described below (TR2): 0   _________________________________________________________   Nodule labeled 1 is a solid isoechoic TR 3 nodule in the thyroid isthmus measuring 4.4 x 3.5 x 2.0 cm. **Given size (>/= 2.5 cm) and appearance, fine needle aspiration of this mildly suspicious nodule should be considered based on TI-RADS criteria.   Nodule  labeled 2 is a peripherally calcified and otherwise indeterminate TR 4 nodule in the superior right thyroid lobe measuring up to 2.6 cm. **Given size (>/= 1.5 cm) and appearance, fine needle aspiration of this moderately suspicious nodule should be considered based on TI-RADS criteria.   Nodule labeled 3 is a solid and hypoechoic TR 4 nodule in the inferior left thyroid lobe measuring 2.3 cm. **Given size (>/= 1.5 cm) and appearance, fine needle aspiration of this moderately suspicious nodule should be considered based on TI-RADS criteria.  IMPRESSION: 1. Multinodular thyroid gland. 2. Nodules labeled 1, 2 and 3 all meet criteria for biopsy. Based on ACR TI-RADS criteria, the 2 most suspicious thyroid nodules are recommended for biopsy. Therefore, nodules labeled 2 and 3 are recommended for biopsy. Note that these recommendations are made in the absence of any previous imaging and/or biopsy information. Correlate with any previous imaging and/or biopsy results if available.  FNA Right superior nodule 07/02/2023  Clinical History: Nodule labeled 2 is peripherally calcified and other  wise indeterminate TR 4 nodule in the superior right thyroid lobe  measuring up to 2.6cm. Given size(>/=1.5cm) and appearance, fine needle  aspiration of this moderately suspicious nodule should be consid  Specimen Submitted:  A. THYROID, RT SUPERIOR NODULE#2, FINE NEEDLE  ASPIRATION    FINAL MICROSCOPIC DIAGNOSIS:  - Scant follicular epithelium present (Bethesda category I)  - Findings suggestive of cyst contents   FNA Isthmic nodule 07/02/2023  Clinical History: Nodule labeled 1 is solid isoechoic TR3 nodule in the  thyroid isthmus measuring 4.4 x 3.5 x 2.0cm. Given size(>/=2.5cm) and  appearance, fine needle aspiration of this mildly suspicious nodule  should be considered based on TI-RADS criteria  Specimen Submitted:  A. THYROID, ISTHMUS NODULE#1, FINE NEEDLE  ASPIRATION    FINAL  MICROSCOPIC DIAGNOSIS:  - Benign follicular nodule (Bethesda category II)   ASSESSMENT/PLAN/RECOMMENDATIONS:   Multinodular goiter  -Patient with chronic dysphagia -She has had history of multiple FNAs over the past few years, she had one  cytology report described as inconclusive in 2011 of the left nodule (records not available).  She did have benign FNA of the right and the left thyroid nodules in 2019 -Her previous endocrinologist had recommended surgery -We also discussed radiofrequency ablation versus thyroidectomy depending on thyroid ultrasound/cytology results -She is s/p benign FNA of isthmic nodule 06/2023 -She is s/p FNA of the right superior calcified nodule with scant cellularity 06/2023   Signed electronically by: Lyndle Herrlich, MD  [facility]  [facility] [address] [place], [place] [address] Phone: [phone] FAX: [phone]   CC: [name], MD [address] [place] [address] Phone: [phone] Fax: [phone]   Return to Endocrinology clinic as below: Future Appointments  Date Time Provider Department Center  11/21/2023  8:40 AM [name], MD [facility]  02/04/2024  3:30 PM Sater, Pearletha Furl, MD [facility] None

## 2023-10-20 DIAGNOSIS — Z419 Encounter for procedure for purposes other than remedying health state, unspecified: Secondary | ICD-10-CM | POA: Diagnosis not present

## 2023-10-26 ENCOUNTER — Other Ambulatory Visit: Payer: Self-pay | Admitting: Family Medicine

## 2023-10-26 DIAGNOSIS — M150 Primary generalized (osteo)arthritis: Secondary | ICD-10-CM

## 2023-10-26 DIAGNOSIS — M48061 Spinal stenosis, lumbar region without neurogenic claudication: Secondary | ICD-10-CM

## 2023-10-28 MED ORDER — HYDROCODONE-ACETAMINOPHEN 5-325 MG PO TABS
1.0000 | ORAL_TABLET | Freq: Four times a day (QID) | ORAL | 0 refills | Status: DC | PRN
Start: 2023-10-28 — End: 2023-11-29

## 2023-10-29 ENCOUNTER — Inpatient Hospital Stay: Admission: RE | Admit: 2023-10-29 | Payer: 59 | Source: Ambulatory Visit

## 2023-11-20 DIAGNOSIS — Z419 Encounter for procedure for purposes other than remedying health state, unspecified: Secondary | ICD-10-CM | POA: Diagnosis not present

## 2023-11-21 ENCOUNTER — Encounter: Payer: Self-pay | Admitting: Family Medicine

## 2023-11-21 ENCOUNTER — Ambulatory Visit: Payer: Medicaid Other | Admitting: Family Medicine

## 2023-11-21 VITALS — BP 129/83 | HR 84 | Resp 18 | Ht 66.0 in | Wt 272.0 lb

## 2023-11-21 DIAGNOSIS — E042 Nontoxic multinodular goiter: Secondary | ICD-10-CM

## 2023-11-21 DIAGNOSIS — F4323 Adjustment disorder with mixed anxiety and depressed mood: Secondary | ICD-10-CM

## 2023-11-21 DIAGNOSIS — E781 Pure hyperglyceridemia: Secondary | ICD-10-CM | POA: Diagnosis not present

## 2023-11-21 DIAGNOSIS — I10 Essential (primary) hypertension: Secondary | ICD-10-CM | POA: Diagnosis not present

## 2023-11-21 DIAGNOSIS — R7309 Other abnormal glucose: Secondary | ICD-10-CM | POA: Diagnosis not present

## 2023-11-21 DIAGNOSIS — G8929 Other chronic pain: Secondary | ICD-10-CM

## 2023-11-21 DIAGNOSIS — M48061 Spinal stenosis, lumbar region without neurogenic claudication: Secondary | ICD-10-CM

## 2023-11-21 DIAGNOSIS — G35 Multiple sclerosis: Secondary | ICD-10-CM | POA: Diagnosis not present

## 2023-11-21 DIAGNOSIS — G4701 Insomnia due to medical condition: Secondary | ICD-10-CM

## 2023-11-21 MED ORDER — HYDROXYZINE HCL 25 MG PO TABS: 12.5000 mg | ORAL_TABLET | Freq: Every day | ORAL | 3 refills | Status: DC | PRN

## 2023-11-21 NOTE — Assessment & Plan Note (Signed)
 Insomnia secondary to chronic pain. Managed with hydroxyzine  12.5-25 mg as needed. Reports taking hydroxyzine  every other day or as needed, but experiences difficulty waking up with a full dose. Discussed optimizing sleep hygiene and potential side effects of hydroxyzine . - Continue hydroxyzine  12.5-25 mg as needed.

## 2023-11-21 NOTE — Progress Notes (Signed)
 Established patient visit   Patient: Ruth Woods   DOB: Mar 06, 1966   58 y.o. Female  MRN: [id number] Visit Date: 11/21/2023  Today's healthcare provider: Rockie Agent, MD   Chief Complaint  Patient presents with   Medical Management of Chronic Issues   Subjective       Discussed the use of AI scribe software for clinical note transcription with the patient, who gave verbal consent to proceed.  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 The patient's primary concern is chronic pain, managed with meloxicam  15mg  daily and hydrocodone -acetaminophen  5-325mg , taken 1-2 tablets every six hours as needed. The patient reports that the pain is consistent, with some days being worse than others.  The patient also reports a recent development of a head tremor, noticed by her spouse during periods of relaxation. The patient is scheduled to see a neurologist for further evaluation of this symptom.  In addition to the above, the patient has a multinodular goiter managed by endocrinology. The patient reports one nodule was biopsied and found to be benign, but another nodule could not be adequately sampled due to patient discomfort during the procedure. The patient is scheduled for a repeat biopsy and expresses concern about the potential need for thyroidectomy.  The patient also reports a history of elevated cholesterol, triglycerides, and A1c, and expresses a desire to recheck these levels due to lifestyle modifications aimed at improving these parameters. The patient has been making efforts to reduce sugar intake and increase water consumption.  Lastly, the patient mentions a history of gestational diabetes and a  family history of Parkinson's disease, which adds to her concern about the newly developed tremor. The patient is proactive in managing her health, with upcoming appointments scheduled with an eye doctor and a dentist.         Past Medical History:  Diagnosis Date   Allergy 2006   Levaquin   Arthritis 2010   Carpal tunnel syndrome 10/08/2018   Collagen vascular disease (HCC)    Lupus    Polyarthralgia 05/28/2019   Sleep apnea    Thyroid  disease 2006    Medications: Outpatient Medications Prior to Visit  Medication Sig   HYDROcodone -acetaminophen  (NORCO/VICODIN) 5-325 MG tablet Take 1-2 tablets by mouth every 6 (six) hours as needed for moderate pain (pain score 4-6). Take 1-2 times daily as needed   meloxicam  (MOBIC ) 15 MG tablet Take 1 tablet (15 mg total) by mouth daily at 6 (six) AM.   [DISCONTINUED] hydrOXYzine  (ATARAX ) 25 MG tablet Take 12.5-25 mg by mouth daily as needed.   No facility-administered medications prior to visit.    Review of Systems  Last CBC Lab Results  Component Value Date   WBC 6.4 08/22/2023   HGB 13.5 08/22/2023   HCT 40.7 08/22/2023   MCV 81 08/22/2023   MCH 26.8 08/22/2023   RDW 14.1 08/22/2023   PLT 297 08/22/2023   Last metabolic panel Lab Results  Component Value Date   GLUCOSE 120 (H) 05/22/2023   NA 142 05/22/2023   K 4.5 05/22/2023   CL 104 05/22/2023   CO2 24 05/22/2023   BUN 10 05/22/2023   CREATININE 0.66 05/22/2023   EGFR 102 05/22/2023   CALCIUM 9.7 05/22/2023   PROT 6.8 05/22/2023  ALBUMIN 4.3 05/22/2023   LABGLOB 2.5 05/22/2023   AGRATIO 1.4 02/20/2023   BILITOT 0.3 05/22/2023   ALKPHOS 104 05/22/2023   AST 15 05/22/2023   ALT 26 05/22/2023   ANIONGAP 10 04/02/2022        Objective    BP 129/83   Pulse 84   Resp 18   Ht 5' 6 (1.676 m)   Wt 272 lb (123.4 kg)   SpO2 97%   BMI 43.90 kg/m  BP Readings from Last 3 Encounters:  11/21/23 129/83  10/01/23 126/78  08/22/23 (!) 140/76   Wt Readings from  Last 3 Encounters:  11/21/23 272 lb (123.4 kg)  10/01/23 269 lb (122 kg)  08/22/23 268 lb 6.4 oz (121.7 kg)        Physical Exam  Physical Exam   VITALS: BP- 129/83 NEUROLOGICAL: No tremor observed during conversation.  General: Alert, no acute distress Cardio: Normal S1 and S2, RRR, no r/m/g Pulm: CTAB, normal work of breathing       No results found for any visits on 11/21/23.  Assessment & Plan     Problem List Items Addressed This Visit       Cardiovascular and Mediastinum   Primary hypertension - Primary   Chronic  Well controlled with lifestyle  No medications recommended today         Endocrine   Multinodular goiter   Multinodular goiter managed by endocrinology. Previous biopsy inconclusive for one nodule. Scheduled for another biopsy next week to assess additional nodules. Discussed potential need for thyroidectomy if nodules are suspicious or symptomatic. - Continue follow-up with endocrinology. - Proceed with scheduled biopsy next week.        Nervous and Auditory   Multiple sclerosis (HCC)   Multiple sclerosis with head tremors when sitting and watching TV, possibly related to MS. Last MRI in May 2023. Follow-up with neurology scheduled. Discussed potential need for MRI to assess current status and possible medication adjustments. - Continue follow-up with neurology. - not currently managed with medications          Other   Spinal stenosis of lumbar region without neurogenic claudication   Chronic pain secondary to lumbar spinal stenosis without neurogenic claudication. Managed with meloxicam  15 mg daily and hydrocodone -acetaminophen  5-325 mg, 1-2 tablets every six hours as needed. Last refill on October 28, 2023, for 30 tablets. Supplements with Tylenol , prefers hydrocodone -acetaminophen  at night. Discussed opioid risks including dependency and side effects. Prefers to avoid daytime medication to prevent impairment during activities. - Continue  meloxicam  15 mg daily and hydrocodone -acetaminophen  5-325 mg as needed. - Monitor pain levels and adjust medication as necessary.      Obesity, Class III, BMI 40-49.9 (morbid obesity) (HCC)   Relevant Orders   Hemoglobin A1c   Lipid panel   Insomnia secondary to chronic pain   Insomnia secondary to chronic pain. Managed with hydroxyzine  12.5-25 mg as needed. Reports taking hydroxyzine  every other day or as needed, but experiences difficulty waking up with a full dose. Discussed optimizing sleep hygiene and potential side effects of hydroxyzine . - Continue hydroxyzine  12.5-25 mg as needed.      Relevant Medications   hydrOXYzine  (ATARAX ) 25 MG tablet   Adjustment disorder with mixed anxiety and depressed mood   Chronic,stable  -Encourage continued participation in support group and open communication about mood changes.       Other Visit Diagnoses       Elevated hemoglobin A1c  Relevant Orders   Hemoglobin A1c     Hypertriglyceridemia       Relevant Orders   Lipid panel        General Health Maintenance Due for several health maintenance screenings and vaccinations. - Recommend updated tetanus vaccine. - Recommend shingles vaccine. - Schedule Pap smear for cervical cancer screening. - Schedule colonoscopy for colon cancer screening.      Return in about 6 months (around 05/25/2024) for CPE.         Rockie Agent, MD  [facility] [phone] (phone) [phone] (fax)  [facility]

## 2023-11-21 NOTE — Assessment & Plan Note (Signed)
 Chronic  Well controlled with lifestyle  No medications recommended today

## 2023-11-21 NOTE — Assessment & Plan Note (Signed)
 Chronic pain secondary to lumbar spinal stenosis without neurogenic claudication. Managed with meloxicam  15 mg daily and hydrocodone -acetaminophen  5-325 mg, 1-2 tablets every six hours as needed. Last refill on October 28, 2023, for 30 tablets. Supplements with Tylenol , prefers hydrocodone -acetaminophen  at night. Discussed opioid risks including dependency and side effects. Prefers to avoid daytime medication to prevent impairment during activities. - Continue meloxicam  15 mg daily and hydrocodone -acetaminophen  5-325 mg as needed. - Monitor pain levels and adjust medication as necessary.

## 2023-11-21 NOTE — Assessment & Plan Note (Signed)
 Chronic,stable  -Encourage continued participation in support group and open communication about mood changes.

## 2023-11-21 NOTE — Assessment & Plan Note (Signed)
 Multinodular goiter managed by endocrinology. Previous biopsy inconclusive for one nodule. Scheduled for another biopsy next week to assess additional nodules. Discussed potential need for thyroidectomy if nodules are suspicious or symptomatic. - Continue follow-up with endocrinology. - Proceed with scheduled biopsy next week.

## 2023-11-21 NOTE — Assessment & Plan Note (Signed)
 Multiple sclerosis with head tremors when sitting and watching TV, possibly related to MS. Last MRI in May 2023. Follow-up with neurology scheduled. Discussed potential need for MRI to assess current status and possible medication adjustments. - Continue follow-up with neurology. - not currently managed with medications

## 2023-11-22 LAB — LIPID PANEL
Chol/HDL Ratio: 4.1 {ratio} (ref 0.0–4.4)
Cholesterol, Total: 205 mg/dL — ABNORMAL HIGH (ref 100–199)
HDL: 50 mg/dL (ref >39)
LDL Chol Calc (NIH): 114 mg/dL — ABNORMAL HIGH (ref 0–99)
Triglycerides: 239 mg/dL — ABNORMAL HIGH (ref 0–149)
VLDL Cholesterol Cal: 41 mg/dL — ABNORMAL HIGH (ref 5–40)

## 2023-11-22 LAB — HEMOGLOBIN A1C
Est. average glucose Bld gHb Est-mCnc: 128 mg/dL
Hgb A1c MFr Bld: 6.1 % — ABNORMAL HIGH (ref 4.8–5.6)

## 2023-11-28 ENCOUNTER — Ambulatory Visit
Admission: RE | Admit: 2023-11-28 | Discharge: 2023-11-28 | Disposition: A | Discharge status: Home / Self Care | Payer: Medicaid Other | Source: Ambulatory Visit | Attending: Internal Medicine | Admitting: Internal Medicine

## 2023-11-28 ENCOUNTER — Other Ambulatory Visit (HOSPITAL_COMMUNITY)
Admission: RE | Admit: 2023-11-28 | Discharge: 2023-11-28 | Disposition: A | Discharge status: Home / Self Care | Payer: Medicaid Other | Source: Ambulatory Visit | Attending: Interventional Radiology | Admitting: Interventional Radiology

## 2023-11-28 DIAGNOSIS — E041 Nontoxic single thyroid nodule: Secondary | ICD-10-CM

## 2023-11-28 DIAGNOSIS — E042 Nontoxic multinodular goiter: Secondary | ICD-10-CM | POA: Diagnosis not present

## 2023-11-29 ENCOUNTER — Other Ambulatory Visit: Payer: Self-pay | Admitting: Family Medicine

## 2023-11-29 DIAGNOSIS — M48061 Spinal stenosis, lumbar region without neurogenic claudication: Secondary | ICD-10-CM

## 2023-11-29 DIAGNOSIS — M150 Primary generalized (osteo)arthritis: Secondary | ICD-10-CM

## 2023-12-02 MED ORDER — HYDROCODONE-ACETAMINOPHEN 5-325 MG PO TABS
1.0000 | ORAL_TABLET | Freq: Four times a day (QID) | ORAL | 0 refills | Status: DC | PRN
Start: 2023-12-02 — End: 2024-01-08

## 2023-12-03 LAB — CYTOLOGY - NON PAP

## 2023-12-13 ENCOUNTER — Other Ambulatory Visit: Payer: Self-pay | Admitting: Family Medicine

## 2023-12-13 DIAGNOSIS — G8929 Other chronic pain: Secondary | ICD-10-CM

## 2023-12-18 ENCOUNTER — Encounter (HOSPITAL_COMMUNITY): Payer: Self-pay

## 2023-12-21 DIAGNOSIS — Z419 Encounter for procedure for purposes other than remedying health state, unspecified: Secondary | ICD-10-CM | POA: Diagnosis not present

## 2024-01-02 ENCOUNTER — Other Ambulatory Visit: Payer: Self-pay | Admitting: Family Medicine

## 2024-01-02 DIAGNOSIS — M150 Primary generalized (osteo)arthritis: Secondary | ICD-10-CM

## 2024-01-02 DIAGNOSIS — M48061 Spinal stenosis, lumbar region without neurogenic claudication: Secondary | ICD-10-CM

## 2024-01-07 ENCOUNTER — Encounter: Payer: Self-pay | Admitting: Family Medicine

## 2024-01-08 ENCOUNTER — Other Ambulatory Visit: Payer: Self-pay | Admitting: Family Medicine

## 2024-01-08 DIAGNOSIS — M150 Primary generalized (osteo)arthritis: Secondary | ICD-10-CM

## 2024-01-08 DIAGNOSIS — M48061 Spinal stenosis, lumbar region without neurogenic claudication: Secondary | ICD-10-CM

## 2024-01-08 MED ORDER — HYDROCODONE-ACETAMINOPHEN 5-325 MG PO TABS
1.0000 | ORAL_TABLET | Freq: Four times a day (QID) | ORAL | 0 refills | Status: DC | PRN
Start: 2024-01-08 — End: 2024-02-10

## 2024-01-18 DIAGNOSIS — Z419 Encounter for procedure for purposes other than remedying health state, unspecified: Secondary | ICD-10-CM | POA: Diagnosis not present

## 2024-01-21 ENCOUNTER — Encounter: Payer: Self-pay | Admitting: Internal Medicine

## 2024-02-04 ENCOUNTER — Encounter: Payer: Self-pay | Admitting: Neurology

## 2024-02-04 ENCOUNTER — Other Ambulatory Visit: Payer: Self-pay | Admitting: Neurology

## 2024-02-04 ENCOUNTER — Ambulatory Visit: Payer: 59 | Admitting: Neurology

## 2024-02-04 VITALS — BP 144/82 | HR 82 | Ht 65.0 in | Wt 275.0 lb

## 2024-02-04 DIAGNOSIS — G35 Multiple sclerosis: Secondary | ICD-10-CM | POA: Diagnosis not present

## 2024-02-04 DIAGNOSIS — G4719 Other hypersomnia: Secondary | ICD-10-CM

## 2024-02-04 DIAGNOSIS — Z79899 Other long term (current) drug therapy: Secondary | ICD-10-CM

## 2024-02-04 DIAGNOSIS — G4730 Sleep apnea, unspecified: Secondary | ICD-10-CM

## 2024-02-04 DIAGNOSIS — G44221 Chronic tension-type headache, intractable: Secondary | ICD-10-CM

## 2024-02-04 DIAGNOSIS — L930 Discoid lupus erythematosus: Secondary | ICD-10-CM

## 2024-02-04 DIAGNOSIS — R0683 Snoring: Secondary | ICD-10-CM | POA: Diagnosis not present

## 2024-02-04 MED ORDER — BUPIVACAINE HCL 0.5 % IJ SOLN
3.0000 mL | Freq: Once | INTRAMUSCULAR | Status: AC
Start: 2024-02-04 — End: 2024-02-04
  Administered 2024-02-04: 3 mL

## 2024-02-04 MED ORDER — METHYLPREDNISOLONE ACETATE 80 MG/ML IJ SUSP
80.0000 mg | Freq: Once | INTRAMUSCULAR | Status: AC
  Administered 2024-02-04: 80 mg via INTRAMUSCULAR

## 2024-02-04 MED ORDER — MODAFINIL 200 MG PO TABS: 200.0000 mg | ORAL_TABLET | Freq: Every day | ORAL | 5 refills | Status: DC

## 2024-02-04 MED ORDER — TERIFLUNOMIDE 14 MG PO TABS: ORAL_TABLET | ORAL | 3 refills | Status: DC

## 2024-02-04 NOTE — Progress Notes (Signed)
 [facility]  PATIENT: Ruth Woods DOB: [birth year]  REFERRING DOCTOR OR PCP: Blythe Stanford MD; Rexene Agent, MD SOURCE: Patient, notes from recent hospitalization, imaging and laboratory reports, multiple MRI images personally reviewed  _________________________________   HISTORICAL  CHIEF COMPLAINT:  Chief Complaint  Patient presents with   Follow-up    Pt in 11 with husband Pt here for MS f/u Pt states increased headaches Pt states tremors in left hand and head     HISTORY OF PRESENT ILLNESS:  Ruth Woods is a 58 y.o. woman with relapsing remitting multiple sclerosis.  Update 02/04/2024 She is getting bifrontal headaches.   Nothing increases or decreases the pain when present.  No identified trigger.  She is having > 15 days a month for > 4 hours a day.    She has some neck tenderness.  No nausea or vomiting.   She was once diagnosed with FMS and carries a diagnosis of SLE.   She also has spinal stenosis in the lumbar spine.   Se has a mild tremor, worse with intention.   She was started on Vumerity for her new diagnosis of RRMS.  She felt poorly and stopped.  I prescribed Aubagio  She has no new neurologic symptom or relapse.   Her vision is better.   Color is still slightly desaturated OS but acuity is back to normal.   She has noted more fatigue the last year.   The steroids helped her fatigue a lot.    She has had irritability and notes more anxiety//stress since the hospitalization. She has noted some brain fog, mostly coming up with the right words.      She has pain all over.   been on duloxetine but did not tolerate it.   She also had trouble tolerating Lyrica in the past.   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    She snores sometimes.   She was been diagnosed with central sleep apnea 7-8 years ago but was unable to tolerate PAP.   She dozes off at  times during the day. Shes wakes up a lot at night due to pain or nocturia.      She has had depression off/on, not severe.    EPWORTH SLEEPINESS SCALE  On a scale of 0 - 3 what is the chance of dozing:  Sitting and Reading:   3 Watching TV:    3 Sitting inactive in a public place: 0 Passenger in car for one hour: 3 Lying down to rest in the afternoon: 3 Sitting and talking to someone: 0 Sitting quietly after lunch:  3 In a car, stopped in traffic:  0  Total (out of 24):   15/24 moderate EDS   MS History: She had the onset of left eye visual blurring and reduced visual field and eye pain upon movements that started early May 2023. Colors were desaturated  She saw ophthalmology and her eyes looked fine.   Symptoms persisted and eye pain worsened so she went to the ED 04/02/2022.   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   She also had 2 small enhancing lesions in the periventricular white matter.   She received 4 days of IV SOlumedrol (one gram/day) and visual symptoms and fatigue improved.      She has had some other  symptoms over the past year that were attributed to her lupus.   Specifically, she sometimes has weakness in her legs that fluctuates though is normal the rest of the time.    Her gait is a little wore the past 2 years and she note sher balance is off at times.   She has had a couple falls, often associated with a dizzy/lightheaded spell.     These spells occur a couple times a month and seem random though may occur more after exerting herself.    She occasionally has left hand and right foot numbness.    She has urinary leakage at times.    She has some frequency and nocturia.  She was diagnosed with SLE in 2010.   She was on plaquenil.     Imaging review: MRI of the orbits 04/02/2022 shows temporal peripheral enhancement of the left optic nerve.Marland Kitchen  MRI of the brain with and without contrast 04/02/2022  shows some T2/FLAIR hyperintense foci in the periventricular, juxtacortical and deep white matter of the hemispheres.  Two foci, 1 in the periventricular white matter on the right and one in the left frontal periventricular white matter enhance after contrast consistent with active demyelinating lesions.   No infratentorial lesions.   MRI of the cervical and thoracic spine 04/02/2022 showed a normal spinal cord.  There are degenerative changes noted.  At C3-C4, a left disc osteophyte complex and facet hypertrophy causes moderately severe foraminal narrowing that could affect the left C4 nerve root.  Degenerative changes at other levels do not cause spinal stenosis or nerve root compression.  LABS: 04/02/2022:  CBC/D, CMP were noncontributary (does have elevated glucose).   HIV was negative.     REVIEW OF SYSTEMS: Constitutional: No fevers, chills, sweats, or change in appetite Eyes: No visual changes, double vision, eye pain Ear, nose and throat: No hearing loss, ear pain, nasal congestion, sore throat Cardiovascular: No chest pain, palpitations Respiratory:  No shortness of breath at rest or with exertion.   No wheezes GastrointestinaI: No nausea, vomiting, diarrhea, abdominal pain, fecal incontinence Genitourinary:  No dysuria, urinary retention or frequency.  No nocturia. Musculoskeletal:  No neck pain, back pain Integumentary: No rash, pruritus, skin lesions Neurological: as above Psychiatric: No depression at this time.  No anxiety Endocrine: No palpitations, diaphoresis, change in appetite, change in weigh or increased thirst Hematologic/Lymphatic:  No anemia, purpura, petechiae. Allergic/Immunologic: No itchy/runny eyes, nasal congestion, recent allergic reactions, rashes  ALLERGIES: Allergies  Allergen Reactions   Levofloxacin Shortness Of Breath    Other reaction(s): Respiratory Distress   Levaquin [Levofloxacin In D5w]    Penicillins Rash    HOME MEDICATIONS:  Current  Outpatient Medications:    HYDROcodone-acetaminophen (NORCO/VICODIN) 5-325 MG tablet, Take 1-2 tablets by mouth every 6 (six) hours as needed for moderate pain (pain score 4-6). Take 1-2 times daily as needed, Disp: 30 tablet, Rfl: 0   hydrOXYzine (ATARAX) 25 MG tablet, TAKE 0.5-1 TABLETS (12.5-25 MG TOTAL) BY MOUTH DAILY AS NEEDED., Disp: 90 tablet, Rfl: 2   meloxicam (MOBIC) 15 MG tablet, Take 1 tablet (15 mg total) by mouth daily at 6 (six) AM., Disp: 90 tablet, Rfl: 1   modafinil (PROVIGIL) 200 MG tablet, Take 1 tablet (200 mg total) by mouth daily., Disp: 30 tablet, Rfl: 5   Teriflunomide 14 MG TABS, Ne po qd, Disp: 90 tablet, Rfl: 3  Current Facility-Administered Medications:    bupivacaine (MARCAINE) 0.5 % (with pres) injection 3 mL, 3 mL, Infiltration,  Once, Ruth Woods, Pearletha Furl, MD   methylPREDNISolone acetate (DEPO-MEDROL) injection 80 mg, 80 mg, Intramuscular, Once, Ruth Woods, Pearletha Furl, MD  PAST MEDICAL HISTORY: Past Medical History:  Diagnosis Date   Allergy 2006   Levaquin   Arthritis 2010   Carpal tunnel syndrome 10/08/2018   Collagen vascular disease (HCC)    Lupus    Polyarthralgia 05/28/2019   Sleep apnea    Thyroid disease 2006    PAST SURGICAL HISTORY: Past Surgical History:  Procedure Laterality Date   ABDOMINAL HYSTERECTOMY     CESAREAN SECTION  1992   CHOLECYSTECTOMY     COSMETIC SURGERY  1987    FAMILY HISTORY: Family History  Problem Relation Age of Onset   Diabetes Mother    Breast cancer Mother    Cancer Mother    Pancreatic cancer Father    Arthritis Father    Cancer Father    Breast cancer Sister    Thyroid cancer Sister    Cancer Sister    Skin cancer Brother    Diabetes Brother    Parkinson's disease Paternal Uncle     SOCIAL HISTORY:  Social History   Socioeconomic History   Marital status: Married    Spouse name: Dennard Nip   Number of children: 2   Years of education: Not on file   Highest education level: Associate degree:  occupational, Scientist, product/process development, or vocational program  Occupational History   Not on file  Tobacco Use   Smoking status: Former    Current packs/day: 0.00    Types: Cigarettes    Quit date: 11/20/2015    Years since quitting: 8.2   Smokeless tobacco: Never  Vaping Use   Vaping status: Never Used  Substance and Sexual Activity   Alcohol use: Not Currently    Alcohol/week: 1.0 standard drink of alcohol    Types: 1 Standard drinks or equivalent per week    Comment: social   Drug use: Not Currently   Sexual activity: Not Currently    Birth control/protection: Surgical  Other Topics Concern   Not on file  Social History Narrative   Lives at home with husband   R handed   Caffeine: 2 C of coffee a day   Retired    Teacher, 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 (08/22/2023)   Overall Financial Resource Strain (CARDIA)    Difficulty of Paying Living Expenses: Somewhat hard  Food Insecurity: No Food Insecurity (08/22/2023)   Hunger Vital Sign    Worried About Running Out of Food in the Last Year: Never true    Ran Out of Food in the Last Year: Never true  Transportation Needs: No Transportation Needs (08/22/2023)   PRAPARE - Administrator, Civil Service (Medical): No    Lack of Transportation (Non-Medical): No  Physical Activity: Insufficiently Active (08/22/2023)   Exercise Vital Sign    Days of Exercise per Week: 5 days    Minutes of Exercise per Session: 20 min  Stress: No Stress Concern Present (08/22/2023)   [employer] of Occupational Health - Occupational Stress Questionnaire    Feeling of Stress : Not at all  Social Connections: Moderately Integrated (08/22/2023)   Social Connection and Isolation Panel [NHANES]    Frequency of Communication with Friends and Family: More than three times a week    Frequency of Social Gatherings with Friends and Family: Patient declined    Attends Religious Services: More than 4 times per year  Active Member  of Clubs or Organizations: No    Attends Banker Meetings: Not on file    Marital Status: Married  Intimate Partner Violence: Not on file     PHYSICAL EXAM  Vitals:   02/04/24 1507  BP: (!) 144/82  Pulse: 82  Weight: 275 lb (124.7 kg)  Height: 5\' 5"  (1.651 m)    Body mass index is 45.76 kg/m.  No results found.   General: The patient is well-developed and well-nourished and in no acute distress.  Pharynx is Mallampati 2  HEENT:  Head is Dwight/AT.  Sclera are anicteric.   Neck: good ROM.  She is tender over the occiput bilaterally.  Musculoskeletal:  Back is nontender  Neurologic Exam  Mental status: The patient is alert and oriented x 3 at the time of the examination. The patient has apparent normal recent and remote memory, with an apparently normal attention span and concentration ability.   Speech is normal.  Cranial nerves: Extraocular movements are full.  She had a 2+ left APD.     Color vision was symmetric.  Facial symmetry is present. There is good facial sensation to soft touch bilaterally.Facial strength is normal.  Trapezius and sternocleidomastoid strength is normal. No dysarthria is noted.  . No obvious hearing deficits are noted.  Motor:  Muscle bulk is normal.   Tone is normal. Strength is  5 / 5 in all 4 extremities except 4+/5 left APB   Sensory: Sensory testing is intact to pinprick, soft touch in all 4 extremities except red vibration in left hand.  Coordination: Cerebellar testing reveals good finger-nose-finger and heel-to-shin bilaterally.  Gait and station: Station is normal.   The gait was normal.  Tandem gait was mildly wide.. Romberg is negative.   Reflexes: Deep tendon reflexes are symmetric and normal bilaterally.        DIAGNOSTIC DATA (LABS, IMAGING, TESTING) - I reviewed patient records, labs, notes, testing and imaging myself where available.  Lab Results  Component Value Date   WBC 6.4 08/22/2023   HGB 13.5 08/22/2023    HCT 40.7 08/22/2023   MCV 81 08/22/2023   PLT 297 08/22/2023      Component Value Date/Time   NA 142 05/22/2023 1007   K 4.5 05/22/2023 1007   CL 104 05/22/2023 1007   CO2 24 05/22/2023 1007   GLUCOSE 120 (H) 05/22/2023 1007   GLUCOSE 136 (H) 04/02/2022 1246   BUN 10 05/22/2023 1007   CREATININE 0.66 05/22/2023 1007   CALCIUM 9.7 05/22/2023 1007   PROT 6.8 05/22/2023 1007   ALBUMIN 4.3 05/22/2023 1007   AST 15 05/22/2023 1007   ALT 26 05/22/2023 1007   ALKPHOS 104 05/22/2023 1007   BILITOT 0.3 05/22/2023 1007   GFRNONAA >60 04/03/2022 0300   GFRAA >60 07/18/2018 0536       ASSESSMENT AND PLAN  Multiple sclerosis (HCC) - Plan: Hepatic function panel, Hepatic function panel, Hepatic function panel, Hepatic function panel, Comprehensive metabolic panel, CBC with Differential/Platelet, Hepatic function panel  Discoid lupus 2010 - Plan: Hepatic function panel, Hepatic function panel, Hepatic function panel, Hepatic function panel, Hepatic function panel  High risk medication use - Plan: Hepatic function panel, Hepatic function panel, Hepatic function panel, Hepatic function panel, Comprehensive metabolic panel, CBC with Differential/Platelet, Hepatic function panel  Snoring - Plan: Home sleep test  Excessive daytime sleepiness - Plan: Home sleep test  Sleep apnea, unspecified type - Plan: Home sleep test  Chronic tension-type headache,  intractable - Plan: bupivacaine (MARCAINE) 0.5 % (with pres) injection 3 mL, methylPREDNISolone acetate (DEPO-MEDROL) injection 80 mg  She has stopped the Vumerity due to side effects.  We will start Aubagio.     We discussed that she will need monthly liver tests for 5 to 6 months after starting. Bilateral splenius capitis trigger point injections with 80 mg Depo-Medrol and 3 cc Marcaine using sterile technique.  She tolerated the procedure well and there were no complications.  Pain was much better afterwards. She has a history of sleep  apnea but was unable to tolerate CPAP.  This was many years ago.  We will check a home sleep study to see if significant sleep apnea is contributing to her excessive daytime sleepiness Return in 6 months   Ruth Woods A. Epimenio Foot, MD, [facility] 02/04/2024, 5:37 PM Certified in Neurology, Clinical Neurophysiology, Sleep Medicine and Neuroimaging  [facility] [address] [place], [place] [address] [phone]

## 2024-02-05 LAB — CBC WITH DIFFERENTIAL/PLATELET
Basophils Absolute: 0.1 x10E3/uL (ref 0.0–0.2)
Basos: 1 %
EOS (ABSOLUTE): 0.2 x10E3/uL (ref 0.0–0.4)
Eos: 2 %
Hematocrit: 41 % (ref 34.0–46.6)
Hemoglobin: 13.2 g/dL (ref 11.1–15.9)
Immature Grans (Abs): 0.1 x10E3/uL (ref 0.0–0.1)
Immature Granulocytes: 1 %
Lymphocytes Absolute: 1.8 x10E3/uL (ref 0.7–3.1)
Lymphs: 22 %
MCH: 26.2 pg — ABNORMAL LOW (ref 26.6–33.0)
MCHC: 32.2 g/dL (ref 31.5–35.7)
MCV: 81 fL (ref 79–97)
Monocytes Absolute: 0.5 x10E3/uL (ref 0.1–0.9)
Monocytes: 6 %
Neutrophils Absolute: 5.8 x10E3/uL (ref 1.4–7.0)
Neutrophils: 68 %
Platelets: 304 x10E3/uL (ref 150–450)
RBC: 5.04 x10E6/uL (ref 3.77–5.28)
RDW: 13.9 % (ref 11.7–15.4)
WBC: 8.3 x10E3/uL (ref 3.4–10.8)

## 2024-02-05 LAB — COMPREHENSIVE METABOLIC PANEL
ALT: 23 IU/L (ref 0–32)
AST: 16 IU/L (ref 0–40)
Albumin: 4.2 g/dL (ref 3.8–4.9)
Alkaline Phosphatase: 105 IU/L (ref 44–121)
BUN/Creatinine Ratio: 23 (ref 9–23)
BUN: 15 mg/dL (ref 6–24)
Bilirubin Total: 0.2 mg/dL (ref 0.0–1.2)
CO2: 23 mmol/L (ref 20–29)
Calcium: 9.3 mg/dL (ref 8.7–10.2)
Chloride: 104 mmol/L (ref 96–106)
Creatinine, Ser: 0.65 mg/dL (ref 0.57–1.00)
Globulin, Total: 2.5 g/dL (ref 1.5–4.5)
Glucose: 114 mg/dL — ABNORMAL HIGH (ref 70–99)
Potassium: 4.2 mmol/L (ref 3.5–5.2)
Sodium: 141 mmol/L (ref 134–144)
Total Protein: 6.7 g/dL (ref 6.0–8.5)
eGFR: 102 mL/min/{1.73_m2} (ref >59)

## 2024-02-10 ENCOUNTER — Other Ambulatory Visit: Payer: Self-pay | Admitting: Family Medicine

## 2024-02-10 DIAGNOSIS — M48061 Spinal stenosis, lumbar region without neurogenic claudication: Secondary | ICD-10-CM

## 2024-02-10 DIAGNOSIS — M150 Primary generalized (osteo)arthritis: Secondary | ICD-10-CM

## 2024-02-11 ENCOUNTER — Telehealth: Payer: Self-pay | Admitting: Neurology

## 2024-02-11 MED ORDER — HYDROCODONE-ACETAMINOPHEN 5-325 MG PO TABS: 1.0000 | ORAL_TABLET | Freq: Four times a day (QID) | ORAL | 0 refills | Status: DC | PRN

## 2024-02-11 NOTE — Telephone Encounter (Signed)
 HST [facility] pending

## 2024-02-11 NOTE — Telephone Encounter (Signed)
 [facility] Berkley Harvey: [id number] (ep. 02/11/24 to 05/11/24)

## 2024-02-12 ENCOUNTER — Encounter: Payer: Self-pay | Admitting: Neurology

## 2024-02-12 ENCOUNTER — Other Ambulatory Visit (HOSPITAL_COMMUNITY): Payer: Self-pay

## 2024-02-17 NOTE — Telephone Encounter (Signed)
 HST- [facility] Berkley Harvey: [id number] (ep. 02/11/24 to 05/11/24)   Patient is scheduled at [facility] for 02/18/24 at 1:30 pm.  Sent mychart

## 2024-02-18 ENCOUNTER — Encounter

## 2024-02-18 ENCOUNTER — Telehealth: Payer: Self-pay | Admitting: Neurology

## 2024-02-18 NOTE — Telephone Encounter (Signed)
 Patient just came by the office today to pick up the home sleep study device that Dr. Epimenio Foot had ordered for her to do. Daisy who has been shadowing with me through the day and I walked outside to the patient. I asked her to confirm her DOB she confirmed it and then her husband asked [place] why we were doing it out side and I was not able to get a word in before I could say anything the patient cut me off and stated this is violating HIPAA. I informed her that we were just asking her for her DOB and that there was no other health issues was going to be discussed. Before I could get anything  else out of my mouth she yanked her husband by the arm and turned around and said "no thank you we are not doing this at all" and marched away.

## 2024-02-18 NOTE — Telephone Encounter (Signed)
 Patient called back and expressed she was upset and felt her HIPAA rights were being violated. She did not let me speak and continued to talk about how it was so wrong we met her outside of the building to give her the home sleep study device. I finally was able to speak and explained the process for picking up a sleep study device at our office. Since COVID our office has had patients ring the doorbell to the sleep lab to pick up their HST device. When the tech comes outside the patient is asked to confirm their name and date of birth to confirm it is the correct patient. After that, it is explained how to use the watch device and where to return it. No patient history or current medical information is EVER discussed. I explained this to the patient to which she said "I was never told this!" I responded that the process was explained when she was scheduled as well as it is explained on the directions given to her (via MyChart) before her appointment. The patient continued to express that she was angry and felt like she was being discriminated against because she has Medicaid for insurance. I explained to the patient that the sleep techs do not know what the patient's insurance information is and we do this pick up procedure for each patient scheduled for a home sleep study. It had nothing to do with her insurance carrier. The patient continued to go on about how uncomfortable she was being outside to which I apologized that she felt that way and explained that she could have requested to move inside of the office and we could have accommodated that with no issues. No matter what I said to the patient, or how I explained our process the patient continued to say she felt like her rights were violated and that she had called Medicaid to tell them how she felt. She let me know she no longer wished to have the sleep study in our office and I let her know I would let Dr. Epimenio Foot know. Patient did not request a call back. I  did let the patient know that if anything further was needed Dr. Epimenio Foot or his team would reach out.

## 2024-02-18 NOTE — Telephone Encounter (Signed)
 Also when we were outside no one else was around [place].

## 2024-02-18 NOTE — Telephone Encounter (Signed)
 Attempted to call patient to clarify if she no longer wished to have the sleep study with our office, did not get an answer so I left a detailed voicemail. I asked the patient to please call us back so that we can relay to Dr. Epimenio Foot if she still wanted to have the sleep study or not. I left my direct phone number.

## 2024-02-25 ENCOUNTER — Telehealth: Payer: Self-pay | Admitting: Pharmacy Technician

## 2024-02-25 ENCOUNTER — Other Ambulatory Visit (HOSPITAL_COMMUNITY): Payer: Self-pay

## 2024-02-25 NOTE — Telephone Encounter (Signed)
 Pharmacy Patient Advocate Encounter   Received notification from CoverMyMeds that prior authorization for Modafinil 200MG  tablets is required/requested.   Insurance verification completed.   The patient is insured through [facility] [place] .   Per test claim: PA required; PA submitted to above mentioned insurance via CoverMyMeds Key/confirmation #/EOC [id number] Status is pending

## 2024-02-25 NOTE — Telephone Encounter (Signed)
 Pharmacy Patient Advocate Encounter  Received notification from [place] Medicaid that Prior Authorization for Modafinil 200MG  tablets  has been APPROVED from 02/25/2024 to 02/24/2025   PA #/Case ID/Reference #: [id number]

## 2024-02-29 DIAGNOSIS — Z419 Encounter for procedure for purposes other than remedying health state, unspecified: Secondary | ICD-10-CM | POA: Diagnosis not present

## 2024-03-02 ENCOUNTER — Encounter: Payer: Self-pay | Admitting: Dermatology

## 2024-03-02 ENCOUNTER — Ambulatory Visit: Admitting: Dermatology

## 2024-03-02 DIAGNOSIS — Z808 Family history of malignant neoplasm of other organs or systems: Secondary | ICD-10-CM | POA: Diagnosis not present

## 2024-03-02 DIAGNOSIS — Z1283 Encounter for screening for malignant neoplasm of skin: Secondary | ICD-10-CM | POA: Diagnosis not present

## 2024-03-02 DIAGNOSIS — W908XXA Exposure to other nonionizing radiation, initial encounter: Secondary | ICD-10-CM | POA: Diagnosis not present

## 2024-03-02 DIAGNOSIS — L821 Other seborrheic keratosis: Secondary | ICD-10-CM

## 2024-03-02 DIAGNOSIS — L578 Other skin changes due to chronic exposure to nonionizing radiation: Secondary | ICD-10-CM | POA: Diagnosis not present

## 2024-03-02 DIAGNOSIS — D1801 Hemangioma of skin and subcutaneous tissue: Secondary | ICD-10-CM

## 2024-03-02 DIAGNOSIS — D229 Melanocytic nevi, unspecified: Secondary | ICD-10-CM

## 2024-03-02 DIAGNOSIS — L814 Other melanin hyperpigmentation: Secondary | ICD-10-CM

## 2024-03-02 NOTE — Patient Instructions (Signed)
 Melanoma ABCDEs  Melanoma is the most dangerous type of skin cancer, and is the leading cause of death from skin disease.  You are more likely to develop melanoma if you: Have light-colored skin, light-colored eyes, or red or blond hair Spend a lot of time in the sun Tan regularly, either outdoors or in a tanning bed Have had blistering sunburns, especially during childhood Have a close family member who has had a melanoma Have atypical moles or large birthmarks  Early detection of melanoma is key since treatment is typically straightforward and cure rates are extremely high if we catch it early.   The first sign of melanoma is often a change in a mole or a new dark spot.  The ABCDE system is a way of remembering the signs of melanoma.  A for asymmetry:  The two halves do not match. B for border:  The edges of the growth are irregular. C for color:  A mixture of colors are present instead of an even brown color. D for diameter:  Melanomas are usually (but not always) greater than 6mm - the size of a pencil eraser. E for evolution:  The spot keeps changing in size, shape, and color.  Please check your skin once per month between visits. You can use a small mirror in front and a large mirror behind you to keep an eye on the back side or your body.   If you see any new or changing lesions before your next follow-up, please call to schedule a visit.  Please continue daily skin protection including broad spectrum sunscreen SPF 30+ to sun-exposed areas, reapplying every 2 hours as needed when you're outdoors.    Due to recent changes in healthcare laws, you may see results of your pathology and/or laboratory studies on MyChart before the doctors have had a chance to review them. We understand that in some cases there may be results that are confusing or concerning to you. Please understand that not all results are received at the same time and often the doctors may need to interpret multiple  results in order to provide you with the best plan of care or course of treatment. Therefore, we ask that you please give [place] 2 business days to thoroughly review all your results before contacting the office for clarification. Should we see a critical lab result, you will be contacted sooner.   If You Need Anything After Your Visit  If you have any questions or concerns for your doctor, please call our main line at [phone] and press option 4 to reach your doctor's medical assistant. If no one answers, please leave a voicemail as directed and we will return your call as soon as possible. Messages left after 4 pm will be answered the following business day.   You may also send [place] a message via MyChart. We typically respond to MyChart messages within 1-2 business days.  For prescription refills, please ask your pharmacy to contact our office. Our fax number is [phone].  If you have an urgent issue when the clinic is closed that cannot wait until the next business day, you can page your doctor at the number below.    Please note that while we do our best to be available for urgent issues outside of office hours, we are not available 24/7.   If you have an urgent issue and are unable to reach [place], you may choose to seek medical care at your doctor's office, retail clinic, urgent care  center, or emergency room.  If you have a medical emergency, please immediately call 911 or go to the emergency department.  Pager Numbers  - Dr. Gwen Pounds: [phone]  - Dr. Roseanne Reno: [phone]  - Dr. Katrinka Blazing: [phone]   In the event of inclement weather, please call our main line at [phone] for an update on the status of any delays or closures.  Dermatology Medication Tips: Please keep the boxes that topical medications come in in order to help keep track of the instructions about where and how to use these. Pharmacies typically print the medication instructions only on the boxes and not  directly on the medication tubes.   If your medication is too expensive, please contact our office at [phone] option 4 or send [place] a message through MyChart.   We are unable to tell what your co-pay for medications will be in advance as this is different depending on your insurance coverage. However, we may be able to find a substitute medication at lower cost or fill out paperwork to get insurance to cover a needed medication.   If a prior authorization is required to get your medication covered by your insurance company, please allow [place] 1-2 business days to complete this process.  Drug prices often vary depending on where the prescription is filled and some pharmacies may offer cheaper prices.  The website www.goodrx.com contains coupons for medications through different pharmacies. The prices here do not account for what the cost may be with help from insurance (it may be cheaper with your insurance), but the website can give you the price if you did not use any insurance.  - You can print the associated coupon and take it with your prescription to the pharmacy.  - You may also stop by our office during regular business hours and pick up a GoodRx coupon card.  - If you need your prescription sent electronically to a different pharmacy, notify our office through [facility] or by phone at [phone] option 4.     Si Usted Necesita Algo Despus de Su Visita  Tambin puede enviarnos un mensaje a travs de Clinical cytogeneticist. Por lo general respondemos a los mensajes de MyChart en el transcurso de 1 a 2 das hbiles.  Para renovar recetas, por favor pida a su farmacia que se ponga en contacto con nuestra oficina. Ruth Woods de fax es Ruth Woods [phone].  Si tiene un asunto urgente cuando la clnica est cerrada y que no puede esperar hasta el siguiente da hbil, puede llamar/localizar a su doctor(a) al nmero que aparece a continuacin.   Por favor, tenga en cuenta que aunque hacemos  todo lo posible para estar disponibles para asuntos urgentes fuera del horario de Ruth Woods, no estamos disponibles las 24 horas del da, los 7 [address]  [address] de la [place].   Si tiene un problema urgente y no puede comunicarse con nosotros, puede optar por buscar atencin mdica  en el consultorio de su doctor(a), en una clnica privada, en un centro de atencin urgente o en una sala de emergencias.  Si tiene Engineer, drilling, por favor llame inmediatamente al 911 o vaya a la sala de emergencias.  Nmeros de bper  - Dr. Gwen Pounds: [phone]  - Dra. Roseanne Reno: [phone]  - Dr. Katrinka Blazing: [phone]   En caso de inclemencias del tiempo, por favor llame a Lacy Duverney principal al [phone] para una actualizacin sobre el [place] de cualquier retraso o cierre.  Consejos para la medicacin en dermatologa: Por favor, guarde las cajas  en las que vienen los medicamentos de uso tpico para ayudarle a seguir las instrucciones sobre dnde y cmo usarlos. Las farmacias generalmente imprimen las instrucciones del medicamento slo en las cajas y no directamente en los tubos del [place].   Si su medicamento es muy caro, por favor, pngase en contacto con Rolm Gala llamando al [phone] y presione la opcin 4 o envenos un mensaje a travs de Clinical cytogeneticist.   No podemos decirle cul ser su copago por los medicamentos por adelantado ya que esto es diferente dependiendo de la cobertura de su seguro. Sin embargo, es posible que podamos encontrar un medicamento sustituto a Audiological scientist un formulario para que el seguro cubra el medicamento que se considera necesario.   Si se requiere una autorizacin previa para que su compaa de seguros [place] su medicamento, por favor permtanos de 1 a 2 das hbiles para completar [address]  Los precios de los medicamentos varan con frecuencia dependiendo del Environmental consultant de dnde se surte la receta y alguna farmacias pueden ofrecer precios ms baratos.  El  sitio web www.goodrx.com tiene cupones para medicamentos de Health and safety inspector. Los precios aqu no tienen en cuenta lo que podra costar con la ayuda del seguro (puede ser ms barato con su seguro), pero el sitio web puede darle el precio si no utiliz Tourist information centre manager.  - Puede imprimir el cupn correspondiente y llevarlo con su receta a la farmacia.  - Tambin puede pasar por nuestra oficina durante el horario de atencin regular y Education officer, museum una tarjeta de cupones de GoodRx.  - Si necesita que su receta se enve electrnicamente a una farmacia diferente, informe a nuestra oficina a travs de MyChart de [facility] o por telfono llamando al [phone] y presione la opcin 4.

## 2024-03-02 NOTE — Progress Notes (Signed)
   Follow-Up Visit   Subjective  Ruth Woods is a 58 y.o. female who presents for the following: Skin Cancer Screening and Full Body Skin Exam  The patient presents for Total-Body Skin Exam (TBSE) for skin cancer screening and mole check. The patient has spots, moles and lesions to be evaluated, some may be new or changing and the patient may have concern these could be cancer.  Patient advises her father, brother and uncle have all had BCC. No personal hx skin cancer. Patient does have a spot at back that is new and itches.   The following portions of the chart were reviewed this encounter and updated as appropriate: medications, allergies, medical history  Review of Systems:  No other skin or systemic complaints except as noted in HPI or Assessment and Plan.  Objective  Well appearing patient in no apparent distress; mood and affect are within normal limits.  A full examination was performed including scalp, head, eyes, ears, nose, lips, neck, chest, axillae, abdomen, back, buttocks, bilateral upper extremities, bilateral lower extremities, hands, feet, fingers, toes, fingernails, and toenails. All findings within normal limits unless otherwise noted below.   Relevant physical exam findings are noted in the Assessment and Plan.    Assessment & Plan   SKIN CANCER SCREENING PERFORMED TODAY.  ACTINIC DAMAGE - Chronic condition, secondary to cumulative UV/sun exposure - diffuse scaly erythematous macules with underlying dyspigmentation - Recommend daily broad spectrum sunscreen SPF 30+ to sun-exposed areas, reapply every 2 hours as needed.  - Staying in the shade or wearing long sleeves, sun glasses (UVA+UVB protection) and wide brim hats (4-inch brim around the entire circumference of the hat) are also recommended for sun protection.  - Call for new or changing lesions.  LENTIGINES, SEBORRHEIC KERATOSES, HEMANGIOMAS - Benign normal skin lesions - Benign-appearing - Call for  any changes  MELANOCYTIC NEVI - Tan-brown and/or pink-flesh-colored symmetric macules and papules - Benign appearing on exam today - Observation - Call clinic for new or changing moles - Recommend daily use of broad spectrum spf 30+ sunscreen to sun-exposed areas.       MULTIPLE BENIGN NEVI   LENTIGINES   CHERRY ANGIOMA   SEBORRHEIC KERATOSES   ACTINIC ELASTOSIS   Return for TBSE 1-2 years.  Kerstin Peeling, RMA, am acting as scribe for Harris Liming, MD .   Documentation: I have reviewed the above documentation for accuracy and completeness, and I agree with the above.  Harris Liming, MD

## 2024-03-16 ENCOUNTER — Other Ambulatory Visit: Payer: Self-pay | Admitting: Family Medicine

## 2024-03-16 DIAGNOSIS — M150 Primary generalized (osteo)arthritis: Secondary | ICD-10-CM

## 2024-03-16 DIAGNOSIS — M48061 Spinal stenosis, lumbar region without neurogenic claudication: Secondary | ICD-10-CM

## 2024-03-16 NOTE — Telephone Encounter (Signed)
 LOV 1*2*25 NOV 7*7*25 LRF 3*25*25 LABS [id number]

## 2024-03-18 MED ORDER — HYDROCODONE-ACETAMINOPHEN 5-325 MG PO TABS: 1.0000 | ORAL_TABLET | Freq: Four times a day (QID) | ORAL | 0 refills | Status: DC | PRN

## 2024-03-26 ENCOUNTER — Ambulatory Visit
Admission: RE | Admit: 2024-03-26 | Discharge: 2024-03-26 | Disposition: A | Discharge status: Home / Self Care | Source: Ambulatory Visit | Attending: Family Medicine | Admitting: Family Medicine

## 2024-03-26 ENCOUNTER — Ambulatory Visit
Admission: RE | Admit: 2024-03-26 | Discharge: 2024-03-26 | Disposition: A | Discharge status: Home / Self Care | Attending: Family Medicine | Admitting: Family Medicine

## 2024-03-26 ENCOUNTER — Ambulatory Visit (INDEPENDENT_AMBULATORY_CARE_PROVIDER_SITE_OTHER): Admitting: Family Medicine

## 2024-03-26 VITALS — BP 140/71 | HR 79 | Resp 18 | Ht 65.0 in | Wt 274.0 lb

## 2024-03-26 DIAGNOSIS — S46812A Strain of other muscles, fascia and tendons at shoulder and upper arm level, left arm, initial encounter: Secondary | ICD-10-CM

## 2024-03-26 DIAGNOSIS — S161XXA Strain of muscle, fascia and tendon at neck level, initial encounter: Secondary | ICD-10-CM | POA: Insufficient documentation

## 2024-03-26 DIAGNOSIS — S46812D Strain of other muscles, fascia and tendons at shoulder and upper arm level, left arm, subsequent encounter: Secondary | ICD-10-CM

## 2024-03-26 DIAGNOSIS — S161XXD Strain of muscle, fascia and tendon at neck level, subsequent encounter: Secondary | ICD-10-CM | POA: Diagnosis not present

## 2024-03-26 DIAGNOSIS — M542 Cervicalgia: Secondary | ICD-10-CM | POA: Diagnosis not present

## 2024-03-26 DIAGNOSIS — M47812 Spondylosis without myelopathy or radiculopathy, cervical region: Secondary | ICD-10-CM | POA: Diagnosis not present

## 2024-03-26 DIAGNOSIS — R221 Localized swelling, mass and lump, neck: Secondary | ICD-10-CM | POA: Diagnosis not present

## 2024-03-26 MED ORDER — CYCLOBENZAPRINE HCL 10 MG PO TABS: 10.0000 mg | ORAL_TABLET | Freq: Every day | ORAL | 0 refills | Status: DC

## 2024-03-26 MED ORDER — GABAPENTIN 100 MG PO CAPS: 100.0000 mg | ORAL_CAPSULE | Freq: Three times a day (TID) | ORAL | 0 refills | Status: DC

## 2024-03-26 NOTE — Patient Instructions (Addendum)
 Please report to [facility] located at:  [address]  [place], [place] [address]  You do not need an appointment to have xrays completed.   Our office will follow up with  results once available.   VISIT SUMMARY: Today, we discussed your chronic neck pain and sleep disturbances that began after a nerve block procedure. You described the pain as radiating from below your left jaw to behind your left ear and down to the back of your neck, with a small bump noted on the back of your neck. Despite taking hydrocodone  and meloxicam , you have not experienced relief. We also reviewed your history of cervical spine stenosis and muscle spasms.  YOUR PLAN: -CHRONIC NECK PAIN: Chronic neck pain is persistent pain in the neck area that has lasted for an extended period. We will order a cervical spine x-ray to understand the underlying cause of your pain. You are prescribed Flexeril 10 mg at night, which you can cut in half if it makes you too sleepy, and gabapentin 100 mg at night, with the option to increase to three times a day if you tolerate it well.  -CERVICAL SPINE STENOSIS: Cervical spine stenosis is a narrowing of the spaces within your spine, which can put pressure on the nerves. We suspect that the recent nerve block injections may have irritated or worsened this condition. No new imaging has been done yet to assess your current status.  -MUSCLE SPASMS: Muscle spasms are involuntary contractions of a muscle. You have muscle spasms in your left trapezius muscle, likely due to nerve irritation from the recent injections. These spasms are contributing to your limited range of motion and pain.  -NODULE ON NECK: A nodule is a small lump. You have a palpable nodule on your left trapezius muscle, which is likely related to the recent nerve block injections. This could be a localized reaction or residual from the injection.  -CHRONIC HEADACHES: Chronic headaches  are headaches that occur frequently over a long period. Your chronic headaches have resolved following the nerve block injections, and you are not currently experiencing any headache symptoms.  INSTRUCTIONS: Please schedule a cervical spine x-ray as soon as possible. Take Flexeril 10 mg at night, and you can cut the tablet in half if it makes you too sleepy. Start gabapentin 100 mg at night, and you can increase it to three times a day if you tolerate it well. Follow up with us  after your x-ray to discuss the results and next steps.                      Contains text generated by [employer]bridge.                                 Contains text generated by Abridge.

## 2024-03-26 NOTE — Progress Notes (Signed)
 Established patient visit   Patient: Ruth Woods   DOB: [birth year]   58 y.o. Female  MRN: [id number] Visit Date: 03/26/2024  Today's healthcare provider: Mimi Alt, MD   Chief Complaint  Patient presents with   Pain    Pt had nerve block with dr sater several weeks ago, the next day started having constant pain that radiates from below left jaw to behind left ear and down to back of neck, 5/10 on pain scale,  Small bump on back of neck  2 injections were done, when she got right side it was fine no pain, but then she got left side she felt excruciating pain when needle went it, She said it did make her headaches go away. She did not tell practice where it was done as she said the doctor would be on leave for months   Subjective     HPI     Pain    Additional comments: Pt had nerve block with dr sater several weeks ago, the next day started having constant pain that radiates from below left jaw to behind left ear and down to back of neck, 5/10 on pain scale,  Small bump on back of neck  2 injections were done, when she got right side it was fine no pain, but then she got left side she felt excruciating pain when needle went it, She said it did make her headaches go away. She did not tell practice where it was done as she said the doctor would be on leave for months      Last edited by Gennaro Khat on 03/26/2024  9:19 AM.       Discussed the use of AI scribe software for clinical note transcription with the patient, who gave verbal consent to proceed.  History of Present Illness Ruth Woods "Larinda Plover" is a 58 year old female with cervical spine stenosis who presents with chronic neck pain and sleep disturbances.  She experiences chronic neck pain that began after receiving a nerve block several weeks ago. The pain radiates from below her left jaw to behind her left ear and down to the back of her neck, rated as 5 out of 10. A small bump is  noted on the back of her neck, and she describes the pain as excruciating during the procedure. The pain has persisted for approximately 8 to 10 weeks since the procedure in March.  Initially, the nerve block relieved her headaches, but the neck pain started later that night and has remained constant. The pain is more intense in the neck with a 'tingly, numbing kind of pain' in the lower part of the jaw, and a sensation that her jaw 'feels like helium'.  She takes hydrocodone  5/325 mg, one to two tablets every six hours as needed for moderate pain, but reports no relief. She also takes meloxicam , suspecting inflammation, but without improvement. She has not returned to the previous provider due to distrust and concerns about the procedure.  She has tried massages, including two professional sessions, without relief. Her husband has also attempted to alleviate her symptoms through massage, noting a 'clicking' sensation in her neck. The pain is sometimes worse in the morning, and she attempts neck exercises to manage it.  She has a history of cervical spine stenosis and expresses concern that the procedure may have exacerbated this condition. She has not had any imaging of her neck since the onset of these symptoms.  In  terms of range of motion, she can move her neck but with limitations, particularly when turning towards the left side, which is painful. Flexion is less painful, but extension and lateral movements are more restricted.     Past Medical History:  Diagnosis Date   Allergy 2006   Levaquin   Arthritis 2010   Carpal tunnel syndrome 10/08/2018   Collagen vascular disease (HCC)    Lupus    Polyarthralgia 05/28/2019   Sleep apnea    Thyroid  disease 2006    Medications: Outpatient Medications Prior to Visit  Medication Sig   HYDROcodone -acetaminophen  (NORCO/VICODIN) 5-325 MG tablet Take 1-2 tablets by mouth every 6 (six) hours as needed for moderate pain (pain score 4-6). Take 1-2  times daily as needed   hydrOXYzine  (ATARAX ) 25 MG tablet TAKE 0.5-1 TABLETS (12.5-25 MG TOTAL) BY MOUTH DAILY AS NEEDED.   meloxicam  (MOBIC ) 15 MG tablet Take 1 tablet (15 mg total) by mouth daily at 6 (six) AM.   modafinil  (PROVIGIL ) 200 MG tablet Take 1 tablet (200 mg total) by mouth daily.   Teriflunomide  14 MG TABS NE BY MOUTH EVERY DAY   No facility-administered medications prior to visit.    Review of Systems      Objective    BP (!) 140/71 (BP Location: Right Arm, Patient Position: Sitting, Cuff Size: Large)   Pulse 79   Resp 18   Ht 5\' 5"  (1.651 m)   Wt 274 lb (124.3 kg)   SpO2 96%   BMI 45.60 kg/m      Physical Exam HENT:     Head:   Neck:     Comments: Limited ROM of left sided lateral neck rotation Normal flexion of neck  Pain with neck extension  Musculoskeletal:       Arms:     Cervical back: No edema, erythema or crepitus. Muscular tenderness present.       No results found for any visits on 03/26/24.  Assessment & Plan     Problem List Items Addressed This Visit       Other   Neck pain on left side - Primary   Chronic neck pain Chronic neck pain persisting for 8-10 weeks following nerve block injections. Pain radiates from below the left jaw to behind the left ear and down to the back of the neck. Pain is rated 5/10 and is constant, with occasional worsening in the morning. Pain is not relieved by hydrocodone  or meloxicam . There is a palpable nodule on the left trapezius muscle, and muscle spasms are present. Range of motion is limited, particularly with extension and lateral flexion. Possible nerve irritation from the injection is considered. Imaging is necessary to assess the underlying cause of the pain. - Order cervical spine x-ray. - Prescribe Flexeril  10 mg at night, with the option to cut in half if too sedating. - Prescribe gabapentin  100 mg at night, with the option to increase to three times a day if tolerated.      Relevant  Medications   cyclobenzaprine  (FLEXERIL ) 10 MG tablet   gabapentin  (NEURONTIN ) 100 MG capsule   Other Relevant Orders   DG Cervical Spine Complete (Completed)   Other Visit Diagnoses       Trapezius muscle strain, left, initial encounter       Relevant Medications   cyclobenzaprine  (FLEXERIL ) 10 MG tablet   gabapentin  (NEURONTIN ) 100 MG capsule   Other Relevant Orders   DG Cervical Spine Complete (Completed)     Strain  of sternocleidomastoid muscle, initial encounter       Relevant Medications   cyclobenzaprine  (FLEXERIL ) 10 MG tablet   gabapentin  (NEURONTIN ) 100 MG capsule   Other Relevant Orders   DG Cervical Spine Complete (Completed)       Assessment & Plan  Cervical spine stenosis Cervical spine stenosis with potential for irritation or exacerbation due to recent nerve block injections. No new imaging available to assess current status.  Muscle spasms Muscle spasms in the left trapezius muscle, likely secondary to nerve irritation from recent injections. Spasms contribute to limited range of motion and pain.  Nodule on neck Palpable nodule approximately 1 cm in diameter on the left trapezius muscle, likely related to recent nerve block injections. Possible localized reaction or residual from injection.  Chronic headaches Chronic headaches previously present, resolved following nerve block injections. No current headache symptoms reported.     Return in about 2 weeks (around 04/09/2024) for left neck pain .         Mimi Alt, MD  [facility] [phone] (phone) [phone] (fax)  [facility]

## 2024-03-27 ENCOUNTER — Encounter: Payer: Self-pay | Admitting: Family Medicine

## 2024-03-27 NOTE — Assessment & Plan Note (Signed)
 Chronic neck pain Chronic neck pain persisting for 8-10 weeks following nerve block injections. Pain radiates from below the left jaw to behind the left ear and down to the back of the neck. Pain is rated 5/10 and is constant, with occasional worsening in the morning. Pain is not relieved by hydrocodone  or meloxicam . There is a palpable nodule on the left trapezius muscle, and muscle spasms are present. Range of motion is limited, particularly with extension and lateral flexion. Possible nerve irritation from the injection is considered. Imaging is necessary to assess the underlying cause of the pain. - Order cervical spine x-ray. - Prescribe Flexeril  10 mg at night, with the option to cut in half if too sedating. - Prescribe gabapentin  100 mg at night, with the option to increase to three times a day if tolerated.

## 2024-03-30 ENCOUNTER — Ambulatory Visit: Payer: 59 | Admitting: Internal Medicine

## 2024-03-30 DIAGNOSIS — Z419 Encounter for procedure for purposes other than remedying health state, unspecified: Secondary | ICD-10-CM | POA: Diagnosis not present

## 2024-04-09 ENCOUNTER — Encounter: Payer: Self-pay | Admitting: Family Medicine

## 2024-04-09 ENCOUNTER — Ambulatory Visit: Admitting: Family Medicine

## 2024-04-09 VITALS — BP 135/70 | HR 86 | Ht 65.0 in | Wt 273.0 lb

## 2024-04-09 DIAGNOSIS — S46812D Strain of other muscles, fascia and tendons at shoulder and upper arm level, left arm, subsequent encounter: Secondary | ICD-10-CM

## 2024-04-09 DIAGNOSIS — R1032 Left lower quadrant pain: Secondary | ICD-10-CM | POA: Diagnosis not present

## 2024-04-09 DIAGNOSIS — S161XXD Strain of muscle, fascia and tendon at neck level, subsequent encounter: Secondary | ICD-10-CM | POA: Diagnosis not present

## 2024-04-09 DIAGNOSIS — S161XXA Strain of muscle, fascia and tendon at neck level, initial encounter: Secondary | ICD-10-CM

## 2024-04-09 DIAGNOSIS — G8929 Other chronic pain: Secondary | ICD-10-CM | POA: Diagnosis not present

## 2024-04-09 DIAGNOSIS — S46812A Strain of other muscles, fascia and tendons at shoulder and upper arm level, left arm, initial encounter: Secondary | ICD-10-CM

## 2024-04-09 DIAGNOSIS — E279 Disorder of adrenal gland, unspecified: Secondary | ICD-10-CM

## 2024-04-09 DIAGNOSIS — M542 Cervicalgia: Secondary | ICD-10-CM | POA: Diagnosis not present

## 2024-04-09 DIAGNOSIS — R1031 Right lower quadrant pain: Secondary | ICD-10-CM | POA: Diagnosis not present

## 2024-04-09 NOTE — Progress Notes (Signed)
 Established patient visit   Patient: Ruth Woods   DOB: [birth year]   58 y.o. Female  MRN: [id number] Visit Date: 04/09/2024  Today's healthcare provider: Mimi Alt, MD   Chief Complaint  Patient presents with   Neck Pain    Things are still the same since last visit    Subjective     HPI     Neck Pain    Additional comments: Things are still the same since last visit       Last edited by Bart Lieu, CMA on 04/09/2024  8:07 AM.       Discussed the use of AI scribe software for clinical note transcription with the patient, who gave verbal consent to proceed.  History of Present Illness Christie Viscomi Brickman "Larinda Plover" is a 58 year old female with a history of neck pain who presents with continued neck pain and concerns about prior imaging findings.  She experiences ongoing neck pain on the left side, which persists despite previous treatment with muscle relaxers and gabapentin . Muscle relaxers provide minimal relief and cause choking episodes, while gabapentin  led to feelings of anger and suicidal ideation, resulting in its discontinuation after six days. She notes a persistent injection site from a previous procedure.  A past MRI from May 2023 revealed a large left foraminal osteophyte and severe left foraminal stenosis at C3-C4, without spinal canal stenosis. She questions the necessity of further imaging.  She reports new left-sided thoracic pain, described as deep and shooting, which she suspects may be due to overcompensation for her neck pain. Exercises found online for nerve decompression have intensified pain in her salivary glands.  She has a history of diverticulitis and an adrenal lesion found on a CT scan in 2019, which was recommended for follow-up but was not pursued. She experiences left-sided thoracic pain and wonders if it could be related to the adrenal lesion. Additionally, she reports a craving for salt and occasional lower  abdominal pain, which she associates with her back pain.  She is currently taking Norco, which helps with her overall pain but not specifically with her neck pain. She expresses concern about developing tolerance to Norco's effects.  No fever, chills, or sick feeling. She feels generally unwell, possibly due to a lupus flare. No saddle anesthesia, bladder, or urinary incontinence.     Past Medical History:  Diagnosis Date   Allergy 2006   Levaquin   Arthritis 2010   Carpal tunnel syndrome 10/08/2018   Collagen vascular disease (HCC)    Lupus    Polyarthralgia 05/28/2019   Sleep apnea    Thyroid  disease 2006    Medications: Outpatient Medications Prior to Visit  Medication Sig   cyclobenzaprine  (FLEXERIL ) 10 MG tablet Take 1 tablet (10 mg total) by mouth at bedtime.   gabapentin  (NEURONTIN ) 100 MG capsule Take 1 capsule (100 mg total) by mouth 3 (three) times daily.   HYDROcodone -acetaminophen  (NORCO/VICODIN) 5-325 MG tablet Take 1-2 tablets by mouth every 6 (six) hours as needed for moderate pain (pain score 4-6). Take 1-2 times daily as needed   hydrOXYzine  (ATARAX ) 25 MG tablet TAKE 0.5-1 TABLETS (12.5-25 MG TOTAL) BY MOUTH DAILY AS NEEDED.   meloxicam  (MOBIC ) 15 MG tablet Take 1 tablet (15 mg total) by mouth daily at 6 (six) AM.   modafinil  (PROVIGIL ) 200 MG tablet Take 1 tablet (200 mg total) by mouth daily.   Teriflunomide  14 MG TABS NE BY MOUTH EVERY DAY   No  facility-administered medications prior to visit.    Review of Systems  DG Cervical Spine Complete Result Date: 03/26/2024 CLINICAL DATA:  Left-sided neck pain. EXAM: CERVICAL SPINE - COMPLETE 4+ VIEW COMPARISON:  MR cervical spine Apr 02, 2022, thyroid  ultrasound April 30, 2023 FINDINGS: There is no evidence of cervical spine fracture or prevertebral soft tissue swelling. Alignment is normal. Minimal anterior spurring noted C6. No other significant bone abnormalities are identified. Incidental finding of a 2.9 cm oval  calcified soft tissue nodule in the right mid neck correlating to patient's known multinodular goiter. IMPRESSION: 1. Minimal degenerative joint changes of cervical spine. 2. Incidental finding of a 2.9 cm oval calcified soft tissue nodule in the right mid neck question calcified thyroid  nodule. Correlating to patient's known multinodular goiter. Electronically Signed   By: Anna Barnes M.D.   On: 03/26/2024 11:33     Last CBC Lab Results  Component Value Date   WBC 8.3 02/04/2024   HGB 13.2 02/04/2024   HCT 41.0 02/04/2024   MCV 81 02/04/2024   MCH 26.2 (L) 02/04/2024   RDW 13.9 02/04/2024   PLT 304 02/04/2024   Last metabolic panel Lab Results  Component Value Date   GLUCOSE 114 (H) 02/04/2024   NA 141 02/04/2024   K 4.2 02/04/2024   CL 104 02/04/2024   CO2 23 02/04/2024   BUN 15 02/04/2024   CREATININE 0.65 02/04/2024   EGFR 102 02/04/2024   CALCIUM 9.3 02/04/2024   PROT 6.7 02/04/2024   ALBUMIN 4.2 02/04/2024   LABGLOB 2.5 02/04/2024   AGRATIO 1.4 02/20/2023   BILITOT 0.2 02/04/2024   ALKPHOS 105 02/04/2024   AST 16 02/04/2024   ALT 23 02/04/2024   ANIONGAP 10 04/02/2022        Objective    BP 135/70   Pulse 86   Ht 5\' 5"  (1.651 m)   Wt 273 lb (123.8 kg)   SpO2 97%   BMI 45.43 kg/m  BP Readings from Last 3 Encounters:  04/09/24 135/70  03/26/24 (!) 140/71  02/04/24 (!) 144/82   Wt Readings from Last 3 Encounters:  04/09/24 273 lb (123.8 kg)  03/26/24 274 lb (124.3 kg)  02/04/24 275 lb (124.7 kg)        Physical Exam  Physical Exam NECK: Bilateral trapezius muscle spasms and tightness with tenderness. Decreased size but palpable nodule at left posterior neck injection site, no erythema. Neck not warm to touch. MUSCULOSKELETAL: Normal range of motion of thoracic and lumbar spine. NEUROLOGICAL: No saddle anesthesia, no bladder or urinary incontinence.    No results found for any visits on 04/09/24.  Assessment & Plan     Problem List Items  Addressed This Visit       Other   Neck pain on left side - Primary   Relevant Orders   Ambulatory referral to Neurosurgery   CT CERVICAL SPINE W WO CONTRAST   Other Visit Diagnoses       Trapezius muscle strain, left, initial encounter       Relevant Orders   Ambulatory referral to Neurosurgery   CT CERVICAL SPINE W WO CONTRAST     Strain of sternocleidomastoid muscle, initial encounter       Relevant Orders   Ambulatory referral to Neurosurgery   CT CERVICAL SPINE W WO CONTRAST     Lesion of adrenal gland (HCC)       Relevant Orders   CT ABDOMEN PELVIS W WO CONTRAST     Abdominal  pain, chronic, bilateral lower quadrant       Relevant Orders   CT ABDOMEN PELVIS W WO CONTRAST       Assessment & Plan Cervical foraminal stenosis with osteophyte Persistent neck pain with muscle spasms and tightness. Imaging from May 2023 showed a large left foraminal osteophyte and severe left foraminal stenosis, suggesting possible nerve compression. Gabapentin  and muscle relaxers were ineffective and caused adverse effects. Concerns about surgical interventions due to fear of complications such as paralysis. - Refer to neurosurgery for evaluation and management - Order CT of cervical spine for updated imaging  Muscle spasms and tightness in neck Bilateral trapezius muscle spasms and tightness with tenderness. Previous injection site still palpable but decreased in size. Muscle relaxers were ineffective and caused choking episodes. - Discontinue muscle relaxers due to ineffectiveness and adverse effects  Lupus flare-up General malaise possibly related to lupus flare-up.  Adrenal lesion, likely benign 14 mm hypodense adrenal lesion identified in 2019 CT abdomen and pelvis with contrast. Likely benign but follow-up imaging was not completed. Left-sided thoracic pain may be related to lesion. - Order CT abdomen and pelvis with and without contrast to reassess adrenal lesion     No  follow-ups on file.         Mimi Alt, MD  [facility] [phone] (phone) [phone] (fax)  [facility]

## 2024-04-20 ENCOUNTER — Encounter: Payer: Self-pay | Admitting: Family Medicine

## 2024-04-21 ENCOUNTER — Other Ambulatory Visit: Payer: Self-pay | Admitting: Family Medicine

## 2024-04-21 ENCOUNTER — Encounter: Payer: Self-pay | Admitting: Family Medicine

## 2024-04-21 DIAGNOSIS — M150 Primary generalized (osteo)arthritis: Secondary | ICD-10-CM

## 2024-04-21 DIAGNOSIS — M48061 Spinal stenosis, lumbar region without neurogenic claudication: Secondary | ICD-10-CM

## 2024-04-21 MED ORDER — HYDROCODONE-ACETAMINOPHEN 5-325 MG PO TABS
1.0000 | ORAL_TABLET | Freq: Four times a day (QID) | ORAL | 0 refills | Status: DC | PRN
Start: 2024-04-21 — End: 2024-05-25

## 2024-04-23 ENCOUNTER — Telehealth: Payer: Self-pay | Admitting: Family Medicine

## 2024-04-23 DIAGNOSIS — M542 Cervicalgia: Secondary | ICD-10-CM

## 2024-04-23 DIAGNOSIS — R221 Localized swelling, mass and lump, neck: Secondary | ICD-10-CM

## 2024-04-23 NOTE — Telephone Encounter (Signed)
 Completed peer to peer review for CT of patient cervical spine in regards to posterior neck nodule following injection and continue neck pain for over 2 months X-rays have been completed and patient needs follow-up of this continued neck pain and nodule She has been tried conservative efforts including  home neck exercises/stretching with no improvement in pain and pain persists despite patient being on chronic narcotic medication  Plan is to replace CT of the cervical spine with C-spine MRI to better evaluate nodule and continued neck pain  This CT of the cervical spine was not approved after today's peer to peer call    Abdomen and pelvis CT was approved for evaluation of patient's history of diverticulitis and continued abdominal pain as well as the follow-up for the adrenal nodules noted on prior imaging  I was advised that a fax will be sent to our office for the approval of the CT abdomen and pelvis and I will resubmit an order for C-spine MRI

## 2024-04-24 ENCOUNTER — Ambulatory Visit: Payer: Self-pay | Admitting: Family Medicine

## 2024-04-24 ENCOUNTER — Ambulatory Visit
Admission: RE | Admit: 2024-04-24 | Discharge: 2024-04-24 | Disposition: A | Discharge status: Home / Self Care | Source: Ambulatory Visit | Attending: Family Medicine | Admitting: Family Medicine

## 2024-04-24 DIAGNOSIS — E279 Disorder of adrenal gland, unspecified: Secondary | ICD-10-CM | POA: Insufficient documentation

## 2024-04-24 DIAGNOSIS — K5730 Diverticulosis of large intestine without perforation or abscess without bleeding: Secondary | ICD-10-CM | POA: Diagnosis not present

## 2024-04-24 DIAGNOSIS — R1031 Right lower quadrant pain: Secondary | ICD-10-CM | POA: Insufficient documentation

## 2024-04-24 DIAGNOSIS — G8929 Other chronic pain: Secondary | ICD-10-CM | POA: Diagnosis not present

## 2024-04-24 DIAGNOSIS — S161XXA Strain of muscle, fascia and tendon at neck level, initial encounter: Secondary | ICD-10-CM | POA: Insufficient documentation

## 2024-04-24 DIAGNOSIS — S46812A Strain of other muscles, fascia and tendons at shoulder and upper arm level, left arm, initial encounter: Secondary | ICD-10-CM | POA: Diagnosis not present

## 2024-04-24 DIAGNOSIS — K449 Diaphragmatic hernia without obstruction or gangrene: Secondary | ICD-10-CM | POA: Diagnosis not present

## 2024-04-24 DIAGNOSIS — R1032 Left lower quadrant pain: Secondary | ICD-10-CM | POA: Insufficient documentation

## 2024-04-24 DIAGNOSIS — M542 Cervicalgia: Secondary | ICD-10-CM | POA: Insufficient documentation

## 2024-04-24 MED ORDER — IOHEXOL 300 MG/ML  SOLN
100.0000 mL | Freq: Once | INTRAMUSCULAR | Status: AC | PRN
  Administered 2024-04-24: 100 mL via INTRAVENOUS

## 2024-04-27 ENCOUNTER — Ambulatory Visit: Admitting: Orthopedic Surgery

## 2024-04-30 DIAGNOSIS — Z419 Encounter for procedure for purposes other than remedying health state, unspecified: Secondary | ICD-10-CM | POA: Diagnosis not present

## 2024-05-20 NOTE — Telephone Encounter (Signed)
 Contacted number for peer to peer of MRI cervical spine   Representative stated that this request was expired and I would not be able to complete peer to peer for imaging because it was more than 10 days since 05/05/24 when the denial was sent   Please notify patient of imaging denial and recommendation for neurosurgery evaluation of continued neck pain

## 2024-05-21 NOTE — Telephone Encounter (Signed)
 Called but was only able to leave a voice message asking her to contact the office back regarding her MRI PA denial. If she returns the call ok to relay the message per Dr JONELLE

## 2024-05-25 ENCOUNTER — Encounter: Payer: Self-pay | Admitting: Family Medicine

## 2024-05-25 ENCOUNTER — Telehealth: Payer: Self-pay

## 2024-05-25 DIAGNOSIS — M150 Primary generalized (osteo)arthritis: Secondary | ICD-10-CM

## 2024-05-25 DIAGNOSIS — M48061 Spinal stenosis, lumbar region without neurogenic claudication: Secondary | ICD-10-CM

## 2024-05-25 MED ORDER — HYDROCODONE-ACETAMINOPHEN 5-325 MG PO TABS: 1.0000 | ORAL_TABLET | Freq: Four times a day (QID) | ORAL | 0 refills | Status: DC | PRN

## 2024-05-25 NOTE — Telephone Encounter (Signed)
 LOV 04/09/24 NOV 06/01/24 LRF 04/21/24 LABS 02/04/24

## 2024-05-28 NOTE — Progress Notes (Unsigned)
 Referring Physician:  Sharma Coyer, MD [address] [place],  [place] [address]  Primary Physician:  Sharma Coyer, MD  History of Present Illness: 06/03/2024 Ruth Woods has a history of discoid lupus, OSA, thyroid  disease, MS, chronic headaches, HTN, obesity.   She had bilateral splenius capitis TPI by Dr. Vear (neurology) on 02/04/24. This improved her headache that she had for months, but now she has constant left sided neck pain that radiates from below the left jaw to behind the left ear and down to the back of her neck. No pain in her arms, but she has numbness in left arm to index finger that is intermittent. No weakness.   Did not tolerate muscle relaxers (choking episodes) or neurontin  (suicidal ideation). PCP is prescribing norco. She is also taking mobic .   She has been getting massages every other week and it helps when she does traction type maneuver.   She does not smoke.   Bowel/Bladder Dysfunction: some urinary incontinence x 4 years  Conservative measures:  Physical therapy: has not participated Multimodal medical therapy including regular antiinflammatories:  Hydrocodone , Meloxicam , Flexeril , Gabapentin  Injections:  no epidural steroid injections  Past Surgery: no spine or neck surgery  Ruth Woods has no symptoms of cervical myelopathy.  The symptoms are causing a significant impact on the patient's life.   Review of Systems:  A 10 point review of systems is negative, except for the pertinent positives and negatives detailed in the HPI.  Past Medical History: Past Medical History:  Diagnosis Date   Allergy 2006   Levaquin   Anxiety    Arthritis 2010   Carpal tunnel syndrome 10/08/2018   Collagen vascular disease (HCC)    GERD (gastroesophageal reflux disease)    Lupus    Polyarthralgia 05/28/2019   Sleep apnea    Thyroid  disease 2006    Past Surgical History: Past Surgical History:   Procedure Laterality Date   ABDOMINAL HYSTERECTOMY     CESAREAN SECTION  1992   CHOLECYSTECTOMY     COSMETIC SURGERY  1987    Allergies: Allergies as of 06/03/2024 - Review Complete 06/03/2024  Allergen Reaction Noted   Levofloxacin Shortness Of Breath 05/28/2019   Flexeril  [cyclobenzaprine ]  06/03/2024   Gabapentin  Other (See Comments) 06/03/2024   Levaquin [levofloxacin in d5w]  07/18/2018   Penicillins Rash 07/18/2018    Medications: Outpatient Encounter Medications as of 06/03/2024  Medication Sig   HYDROcodone -acetaminophen  (NORCO/VICODIN) 5-325 MG tablet Take 1-2 tablets by mouth every 6 (six) hours as needed for moderate pain (pain score 4-6). Take 1-2 times daily as needed   hydrOXYzine  (ATARAX ) 25 MG tablet TAKE 0.5-1 TABLETS (12.5-25 MG TOTAL) BY MOUTH DAILY AS NEEDED.   meloxicam  (MOBIC ) 15 MG tablet Take 1 tablet (15 mg total) by mouth daily at 6 (six) AM.   [DISCONTINUED] cyclobenzaprine  (FLEXERIL ) 10 MG tablet Take 1 tablet (10 mg total) by mouth at bedtime.   [DISCONTINUED] gabapentin  (NEURONTIN ) 100 MG capsule Take 1 capsule (100 mg total) by mouth 3 (three) times daily.   [DISCONTINUED] modafinil  (PROVIGIL ) 200 MG tablet Take 1 tablet (200 mg total) by mouth daily.   [DISCONTINUED] Teriflunomide  14 MG TABS NE BY MOUTH EVERY DAY   No facility-administered encounter medications on file as of 06/03/2024.    Social History: Social History   Tobacco Use   Smoking status: Former    Current packs/day: 0.00    Types: Cigarettes    Quit date: 11/20/2015  Years since quitting: 8.5   Smokeless tobacco: Never  Vaping Use   Vaping status: Never Used  Substance Use Topics   Alcohol use: Not Currently    Alcohol/week: 1.0 standard drink of alcohol    Types: 1 Standard drinks or equivalent per week    Comment: social   Drug use: Not Currently    Family Medical History: Family History  Problem Relation Age of Onset   Diabetes Mother    Breast cancer Mother     Cancer Mother    Pancreatic cancer Father    Arthritis Father    Cancer Father    Breast cancer Sister    Thyroid  cancer Sister    Cancer Sister    Skin cancer Brother    Diabetes Brother    Parkinson's disease Paternal Uncle    Diabetes Maternal Grandmother     Physical Examination: Vitals:   06/03/24 0857  BP: 132/84    General: Patient is well developed, well nourished, calm, collected, and in no apparent distress. Attention to examination is appropriate.  Respiratory: Patient is breathing without any difficulty.   NEUROLOGICAL:     Awake, alert, oriented to person, place, and time.  Speech is clear and fluent. Fund of knowledge is appropriate.   Cranial Nerves: Pupils equal round and reactive to light.  Facial tone is symmetric.    No posterior cervical tenderness. Mild trapezial tenderness on left.   Good ROM of both shoulders with no pain.   No abnormal lesions on exposed skin.   Strength: Side Biceps Triceps Deltoid Interossei Grip Wrist Ext. Wrist Flex.  R 5 5 5 5 5 5 5   L 5 5 5 5 5 5 5    Side Iliopsoas Quads Hamstring PF DF EHL  R 5 5 5 5 5 5   L 5 5 5 5 5 5    Reflexes are 2+ and symmetric at the biceps, brachioradialis, patella and achilles.   Hoffman's is present bilaterally.  Clonus is not present.   Bilateral upper and lower extremity sensation is intact to light touch.     Gait is normal.    Medical Decision Making  Imaging: Cervical xrays dated 03/26/24:  FINDINGS: There is no evidence of cervical spine fracture or prevertebral soft tissue swelling. Alignment is normal. Minimal anterior spurring noted C6. No other significant bone abnormalities are identified. Incidental finding of a 2.9 cm oval calcified soft tissue nodule in the right mid neck correlating to patient's known multinodular goiter.   IMPRESSION: 1. Minimal degenerative joint changes of cervical spine. 2. Incidental finding of a 2.9 cm oval calcified soft tissue nodule in the  right mid neck question calcified thyroid  nodule. Correlating to patient's known multinodular goiter.     Electronically Signed   By: Craig Farr M.D.   On: 03/26/2024 11:33    MRI cervical spine dated 04/02/22:  FINDINGS: MRI CERVICAL SPINE FINDINGS   Alignment: Physiologic.   Vertebrae: No fracture, evidence of discitis, or bone lesion.   Cord: Normal signal and morphology. No abnormal contrast enhancement.   Posterior Fossa, vertebral arteries, paraspinal tissues: Enlarged left thyroid  lobe, incompletely visualized.   Disc levels:   C1-2: Unremarkable.   C2-3: Normal disc space and facet joints. There is no spinal canal stenosis. No neural foraminal stenosis.   C3-4: Large left foraminal uncovertebral osteophyte. There is no spinal canal stenosis. Severe left neural foraminal stenosis.   C4-5: Normal disc space and facet joints. There is no spinal canal stenosis. No  neural foraminal stenosis.   C5-6: Normal disc space and facet joints. There is no spinal canal stenosis. No neural foraminal stenosis.   C6-7: Small disc bulge. There is no spinal canal stenosis. No neural foraminal stenosis.   C7-T1: Normal disc space and facet joints. There is no spinal canal stenosis. No neural foraminal stenosis.   IMPRESSION: 1. No evidence of demyelinating disease of the cervical or thoracic spine. 2. Severe left C3-4 neural foraminal stenosis secondary to uncovertebral osteophyte. 3. Enlarged left thyroid  lobe, incompletely visualized. Outpatient thyroid  ultrasound recommended.     Electronically Signed   By: Franky Stanford M.D.   On: 04/02/2022 23:44  I have personally reviewed the images and agree with the above interpretation.  Assessment and Plan: Ms. Iversen had bilateral splenius capitis TPI by Dr. Vear (neurology) on 02/04/24. This improved her headache that she had for months, but now she has constant left sided neck pain that radiates from below the left jaw  to behind the left ear and down to the back of her neck. No pain in her arms, but she has numbness in left arm to index finger that is intermittent. No weakness.   She has known cervical spondylosis.   Patient reviewed with Dr. 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   Treatment options discussed with patient and following plan made:   - PT for cervical spine. Orders to [facility].  - Discussed C2 block for diagnostic and therapeutic purposes. She wants to try PT first.  - Follow up with me in 6-8 weeks and prn.   I spent a total of 35 minutes in face-to-face and non-face-to-face activities related to this patient's care today including review of outside records, review of imaging, review of symptoms, physical exam, discussion of differential diagnosis, discussion of treatment options, and documentation.   Thank you for involving me in the care of this patient.   Glade Boys PA-C Dept. of Neurosurgery

## 2024-05-30 DIAGNOSIS — Z419 Encounter for procedure for purposes other than remedying health state, unspecified: Secondary | ICD-10-CM | POA: Diagnosis not present

## 2024-06-01 ENCOUNTER — Ambulatory Visit (INDEPENDENT_AMBULATORY_CARE_PROVIDER_SITE_OTHER): Admitting: Family Medicine

## 2024-06-01 ENCOUNTER — Encounter: Payer: Self-pay | Admitting: Family Medicine

## 2024-06-01 VITALS — BP 132/88 | HR 82 | Ht 65.0 in | Wt 272.0 lb

## 2024-06-01 DIAGNOSIS — I10 Essential (primary) hypertension: Secondary | ICD-10-CM

## 2024-06-01 DIAGNOSIS — G4731 Primary central sleep apnea: Secondary | ICD-10-CM

## 2024-06-01 DIAGNOSIS — E559 Vitamin D deficiency, unspecified: Secondary | ICD-10-CM | POA: Diagnosis not present

## 2024-06-01 DIAGNOSIS — R7303 Prediabetes: Secondary | ICD-10-CM | POA: Diagnosis not present

## 2024-06-01 DIAGNOSIS — G35 Multiple sclerosis: Secondary | ICD-10-CM | POA: Diagnosis not present

## 2024-06-01 DIAGNOSIS — G8929 Other chronic pain: Secondary | ICD-10-CM | POA: Diagnosis not present

## 2024-06-01 DIAGNOSIS — R1319 Other dysphagia: Secondary | ICD-10-CM

## 2024-06-01 DIAGNOSIS — E042 Nontoxic multinodular goiter: Secondary | ICD-10-CM | POA: Diagnosis not present

## 2024-06-01 DIAGNOSIS — Z0001 Encounter for general adult medical examination with abnormal findings: Secondary | ICD-10-CM

## 2024-06-01 DIAGNOSIS — Z0000 Encounter for general adult medical examination without abnormal findings: Secondary | ICD-10-CM

## 2024-06-01 DIAGNOSIS — E782 Mixed hyperlipidemia: Secondary | ICD-10-CM

## 2024-06-01 DIAGNOSIS — R5383 Other fatigue: Secondary | ICD-10-CM

## 2024-06-01 DIAGNOSIS — M542 Cervicalgia: Secondary | ICD-10-CM

## 2024-06-01 NOTE — Progress Notes (Signed)
 Complete physical exam   Patient: Ruth Woods   DOB: 09-06-1966   58 y.o. Female  MRN: [id number] Visit Date: 06/01/2024  Today's healthcare provider: Rockie Agent, MD   Chief Complaint  Patient presents with   Annual Exam    Bp was elevated denied the offer to recheck    Care Management    Pattern of eating:General  Are you exercising:NO    Vaccine: Denied all vaccines and colonoscopy       Subjective    Ruth Woods is a 59 y.o. female who presents today for a complete physical exam.    She does not have additional problems to discuss today.   Discussed the use of AI scribe software for clinical note transcription with the patient, who gave verbal consent to proceed.  History of Present Illness Ruth Woods is a 58 year old female who presents for an annual physical exam.  She experiences episodes of waking up gasping for air, occurring four times since her last visit. These episodes last about thirty seconds and are associated with a sensation of airway closure and increased anxiety. She has a history of central sleep apnea and is unable to use a CPAP machine due to claustrophobia and anxiety. The patient reports that she does not fall asleep in public and that others have not noticed her snoring or gasping for air during sleep. Reports fatigue but not excessive daytime sleepiness.  She has a history of neck pain, described as 'livable bad', which worsens during episodes of gasping. She receives massages biweekly, and her massage therapist noted a possible tumor-like mass in her neck. She has an upcoming appointment with neurosurgery. Norco is taken for generalized pain, which does not alleviate her neck pain. She has previously tried gabapentin  and a muscle relaxer.  She has a history of dysphagia, described as intermittent difficulty swallowing, particularly with liquids. She associates this with her neck issues and a  goiter, which was previously evaluated and found to be non-cancerous. She has not had an endoscopy recently.  She has a history of multiple sclerosis and has had an MRI since diagnosis, but no recent imaging for new lesions. She experiences fatigue, which she attributes to her weight rather than sleep apnea, and reports sleeping five to seven hours a night with the aid of Vistaril .     Past Medical History:  Diagnosis Date   Allergy 2006   Levaquin   Arthritis 2010   Carpal tunnel syndrome 10/08/2018   Collagen vascular disease (HCC)    Lupus    Polyarthralgia 05/28/2019   Sleep apnea    Thyroid  disease 2006   Past Surgical History:  Procedure Laterality Date   ABDOMINAL HYSTERECTOMY     CESAREAN SECTION  1992   CHOLECYSTECTOMY     COSMETIC SURGERY  1987   Social History   Socioeconomic History   Marital status: Married    Spouse name: Ruth Woods   Number of children: 2   Years of education: Not on file   Highest education level: Associate degree: academic program  Occupational History   Not on file  Tobacco Use   Smoking status: Former    Current packs/day: 0.00    Types: Cigarettes    Quit date: 11/20/2015    Years since quitting: 8.5   Smokeless tobacco: Never  Vaping Use   Vaping status: Never Used  Substance and Sexual Activity   Alcohol use: Not Currently    Alcohol/week:  1.0 standard drink of alcohol    Types: 1 Standard drinks or equivalent per week    Comment: social   Drug use: Not Currently   Sexual activity: Not Currently    Birth control/protection: Surgical  Other Topics Concern   Not on file  Social History Narrative   Lives at home with husband   R handed   Caffeine: 2 C of coffee a day   Retired    Teacher, 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 (05/25/2024)   Overall Financial Resource Strain (CARDIA)    Difficulty of Paying Living Expenses: Somewhat hard  Food Insecurity: Food Insecurity Present (05/25/2024)   Hunger Vital  Sign    Worried About Running Out of Food in the Last Year: Sometimes true    Ran Out of Food in the Last Year: Never true  Transportation Needs: No Transportation Needs (05/25/2024)   PRAPARE - Administrator, Civil Service (Medical): No    Lack of Transportation (Non-Medical): No  Physical Activity: Insufficiently Active (05/25/2024)   Exercise Vital Sign    Days of Exercise per Week: 2 days    Minutes of Exercise per Session: 20 min  Stress: Stress Concern Present (05/25/2024)   [employer] of Occupational Health - Occupational Stress Questionnaire    Feeling of Stress: Very much  Social Connections: Socially Isolated (05/25/2024)   Social Connection and Isolation Panel    Frequency of Communication with Friends and Family: Once a week    Frequency of Social Gatherings with Friends and Family: Never    Attends Religious Services: Never    Database administrator or Organizations: No    Attends Engineer, structural: Not on file    Marital Status: Married  Catering manager Violence: Not At Risk (03/26/2024)   Humiliation, Afraid, Rape, and Kick questionnaire    Fear of Current or Ex-Partner: No    Emotionally Abused: No    Physically Abused: No    Sexually Abused: No   Family Status  Relation Name Status   Mother Ruth Woods Alive   Father Ruth Woods Deceased   Sister Ruth Woods Alive   Brother Ruth Woods Alive   Nutritional therapist  (Not Specified)  No partnership data on file   Family History  Problem Relation Age of Onset   Diabetes Mother    Breast cancer Mother    Cancer Mother    Pancreatic cancer Father    Arthritis Father    Cancer Father    Breast cancer Sister    Thyroid  cancer Sister    Cancer Sister    Skin cancer Brother    Diabetes Brother    Parkinson's disease Paternal Uncle    Allergies  Allergen Reactions   Levofloxacin Shortness Of Breath    Other reaction(s): Respiratory Distress   Levaquin [Levofloxacin In D5w]    Penicillins Rash      Medications: Outpatient Medications Prior to Visit  Medication Sig   cyclobenzaprine  (FLEXERIL ) 10 MG tablet Take 1 tablet (10 mg total) by mouth at bedtime.   gabapentin  (NEURONTIN ) 100 MG capsule Take 1 capsule (100 mg total) by mouth 3 (three) times daily.   HYDROcodone -acetaminophen  (NORCO/VICODIN) 5-325 MG tablet Take 1-2 tablets by mouth every 6 (six) hours as needed for moderate pain (pain score 4-6). Take 1-2 times daily as needed   hydrOXYzine  (ATARAX ) 25 MG tablet TAKE 0.5-1 TABLETS (12.5-25 MG TOTAL) BY MOUTH DAILY AS NEEDED.   meloxicam  (MOBIC ) 15 MG tablet  Take 1 tablet (15 mg total) by mouth daily at 6 (six) AM.   modafinil  (PROVIGIL ) 200 MG tablet Take 1 tablet (200 mg total) by mouth daily.   Teriflunomide  14 MG TABS NE BY MOUTH EVERY DAY   No facility-administered medications prior to visit.    Review of Systems  Last CBC Lab Results  Component Value Date   WBC 8.3 02/04/2024   HGB 13.2 02/04/2024   HCT 41.0 02/04/2024   MCV 81 02/04/2024   MCH 26.2 (L) 02/04/2024   RDW 13.9 02/04/2024   PLT 304 02/04/2024   Last metabolic panel Lab Results  Component Value Date   GLUCOSE 114 (H) 02/04/2024   NA 141 02/04/2024   K 4.2 02/04/2024   CL 104 02/04/2024   CO2 23 02/04/2024   BUN 15 02/04/2024   CREATININE 0.65 02/04/2024   EGFR 102 02/04/2024   CALCIUM 9.3 02/04/2024   PROT 6.7 02/04/2024   ALBUMIN 4.2 02/04/2024   LABGLOB 2.5 02/04/2024   AGRATIO 1.4 02/20/2023   BILITOT 0.2 02/04/2024   ALKPHOS 105 02/04/2024   AST 16 02/04/2024   ALT 23 02/04/2024   ANIONGAP 10 04/02/2022   Last lipids Lab Results  Component Value Date   CHOL 205 (H) 11/21/2023   HDL 50 11/21/2023   LDLCALC 114 (H) 11/21/2023   TRIG 239 (H) 11/21/2023   CHOLHDL 4.1 11/21/2023   Last hemoglobin A1c Lab Results  Component Value Date   HGBA1C 6.1 (H) 11/21/2023   Last thyroid  functions Lab Results  Component Value Date   TSH 0.536 08/22/2023   Last vitamin D  Lab  Results  Component Value Date   VD25OH 31.6 05/22/2023   Last vitamin B12 and Folate No results found for: VITAMINB12, FOLATE     Objective    BP (!) 138/92   Pulse 82   Ht 5' 5 (1.651 m)   Wt 272 lb (123.4 kg)   SpO2 100%   BMI 45.26 kg/m  BP Readings from Last 3 Encounters:  06/01/24 (!) 138/92  04/09/24 135/70  03/26/24 (!) 140/71   Wt Readings from Last 3 Encounters:  06/01/24 272 lb (123.4 kg)  04/09/24 273 lb (123.8 kg)  03/26/24 274 lb (124.3 kg)        Physical Exam Vitals reviewed.  Constitutional:      General: She is not in acute distress.    Appearance: Normal appearance. She is not ill-appearing, toxic-appearing or diaphoretic.  HENT:     Head: Normocephalic and atraumatic.     Right Ear: Tympanic membrane and external ear normal. There is no impacted cerumen.     Left Ear: Tympanic membrane and external ear normal. There is no impacted cerumen.     Nose: Nose normal.     Mouth/Throat:     Pharynx: Oropharynx is clear.  Eyes:     General: No scleral icterus.    Extraocular Movements: Extraocular movements intact.     Conjunctiva/sclera: Conjunctivae normal.     Pupils: Pupils are equal, round, and reactive to light.  Neck:   Cardiovascular:     Rate and Rhythm: Normal rate and regular rhythm.     Pulses: Normal pulses.     Heart sounds: Normal heart sounds. No murmur heard.    No friction rub. No gallop.  Pulmonary:     Effort: Pulmonary effort is normal. No respiratory distress.     Breath sounds: Normal breath sounds. No wheezing, rhonchi or rales.  Abdominal:  General: Bowel sounds are normal. There is no distension.     Palpations: Abdomen is soft. There is no mass.     Tenderness: There is no abdominal tenderness. There is no guarding.  Musculoskeletal:        General: No deformity.     Cervical back: Normal range of motion and neck supple.     Right lower leg: No edema.     Left lower leg: No edema.  Lymphadenopathy:      Cervical: No cervical adenopathy.  Skin:    General: Skin is warm.     Capillary Refill: Capillary refill takes less than 2 seconds.     Findings: No erythema or rash.  Neurological:     General: No focal deficit present.     Mental Status: She is alert and oriented to person, place, and time.     Cranial Nerves: Cranial nerves 2-12 are intact. No cranial nerve deficit or facial asymmetry.     Motor: Motor function is intact. No weakness.     Gait: Gait normal.  Psychiatric:        Mood and Affect: Mood normal.        Behavior: Behavior normal.       Last depression screening scores    06/01/2024   10:04 AM 04/09/2024    8:08 AM 03/26/2024   11:37 AM  PHQ 2/9 Scores  PHQ - 2 Score 3 6 3   PHQ- 9 Score 10 21 10     Last fall risk screening    05/22/2023    9:23 AM  Fall Risk   Falls in the past year? 1  Number falls in past yr: 1  Injury with Fall? 0  Risk for fall due to : History of fall(s)  Follow up Falls evaluation completed    Last Audit-C alcohol use screening    05/25/2024    6:48 AM  Alcohol Use Disorder Test (AUDIT)  1. How often do you have a drink containing alcohol? 1  2. How many drinks containing alcohol do you have on a typical day when you are drinking? 0  3. How often do you have six or more drinks on one occasion? 0  AUDIT-C Score 1      Patient-reported   A score of 3 or more in women, and 4 or more in men indicates increased risk for alcohol abuse, EXCEPT if all of the points are from question 1   No results found for any visits on 06/01/24.  Assessment & Plan    Routine Health Maintenance and Physical Exam   There is no immunization history on file for this patient.  Health Maintenance  Topic Date Due   DTaP/Tdap/Td (1 - Tdap) Never done   Hepatitis B Vaccines (1 of 3 - 19+ 3-dose series) Never done   Zoster Vaccines- Shingrix (1 of 2) Never done   Cervical Cancer Screening (HPV/Pap Cotest)  Never done   Colonoscopy  Never done    COVID-19 Vaccine (2 - Pfizer risk series) 03/15/2020   INFLUENZA VACCINE  06/19/2024   MAMMOGRAM  07/11/2024   Hepatitis C Screening  Completed   HIV Screening  Completed   HPV VACCINES  Aged Out   Meningococcal B Vaccine  Aged Out    Problem List Items Addressed This Visit       Cardiovascular and Mediastinum   Primary hypertension     Endocrine   Multinodular goiter     Nervous and Auditory  Multiple sclerosis (HCC)     Other   Vitamin D  deficiency   Insomnia secondary to chronic pain   Annual physical exam - Primary    Assessment & Plan Central Sleep Apnea Diagnosed with central sleep apnea, experiencing increased episodes of waking up gasping for air. Unable to use CPAP due to claustrophobia and anxiety. Episodes occur both lying down and sitting up, sometimes with swallowing difficulties. Concern about airway obstruction during sleep. Previous CPAP attempts were unsuccessful. Discussed alternative treatments with sleep medicine specialist. - will defer referral  to sleep medicine specialist for alternative treatment options as we wait on neurosurgery eval and recommendations for neck pain first  Dysphagia Intermittent swallowing difficulties, sometimes associated with episodes of waking up gasping for air. Previous endoscopy suggested possible goiter involvement. Dysphagia may be related to neck issues or goiter growth, exacerbated by neck pain and inflammation. - Evaluate dysphagia after neurosurgery appointment - Consider referral to gastroenterology for endoscopy if dysphagia persists  Neck Pain Chronic neck pain with spasms and tightness. Concerns about possible tumor or anatomical changes affecting neck. Scheduled for neurosurgery evaluation. Increased neck pain correlates with dysphagia episodes. - Attend neurosurgery appointment for evaluation, scheduled 06/03/24  Goiter Non-cancerous goiter with concerns about growth affecting swallowing and neck anatomy.  Interested in surgical removal but advised against it due to current size and non-cancerous nature. - Monitor goiter size and symptoms  General Health Maintenance Declines colon cancer screening, updated tetanus, COVID, and Shingrix vaccines, and cervical cancer screening with Pap smear. Aware of the importance of these screenings but has chosen to decline at this time. - Order diabetes screening - Order cholesterol screening - Order vitamin D  level - Order cortisol level  Follow-up Scheduled follow-up in August to reassess dysphagia and other symptoms. Plan to evaluate the impact of neurosurgery consultation on symptoms. - Attend follow-up appointment in August       No follow-ups on file.       Rockie Agent, MD  [facility] [phone] (phone) [phone] (fax)  [facility]

## 2024-06-02 ENCOUNTER — Ambulatory Visit: Payer: Self-pay | Admitting: Family Medicine

## 2024-06-02 LAB — LIPID PANEL
Chol/HDL Ratio: 3.9 ratio (ref 0.0–4.4)
Cholesterol, Total: 190 mg/dL (ref 100–199)
HDL: 49 mg/dL (ref >39)
LDL Chol Calc (NIH): 102 mg/dL — ABNORMAL HIGH (ref 0–99)
Triglycerides: 229 mg/dL — ABNORMAL HIGH (ref 0–149)
VLDL Cholesterol Cal: 39 mg/dL (ref 5–40)

## 2024-06-02 LAB — CORTISOL: Cortisol: 6 ug/dL — ABNORMAL LOW (ref 6.2–19.4)

## 2024-06-02 LAB — HEMOGLOBIN A1C
Est. average glucose Bld gHb Est-mCnc: 131 mg/dL
Hgb A1c MFr Bld: 6.2 % — ABNORMAL HIGH (ref 4.8–5.6)

## 2024-06-02 LAB — VITAMIN D 25 HYDROXY (VIT D DEFICIENCY, FRACTURES): Vit D, 25-Hydroxy: 25.8 ng/mL — ABNORMAL LOW (ref 30.0–100.0)

## 2024-06-03 ENCOUNTER — Encounter: Payer: Self-pay | Admitting: Orthopedic Surgery

## 2024-06-03 ENCOUNTER — Ambulatory Visit: Admitting: Orthopedic Surgery

## 2024-06-03 VITALS — BP 132/84 | Ht 66.0 in | Wt 273.5 lb

## 2024-06-03 DIAGNOSIS — M47812 Spondylosis without myelopathy or radiculopathy, cervical region: Secondary | ICD-10-CM

## 2024-06-03 DIAGNOSIS — M542 Cervicalgia: Secondary | ICD-10-CM

## 2024-06-03 NOTE — Patient Instructions (Signed)
 It was so nice to see you today. Thank you so much for coming in.    Your neck xrays look okay. 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 This may improve over time, but it may not.   I sent physical therapy orders to [facility]. You need to call them at [phone] to schedule your visit.   If no improvement with above, we can consider an injection around your cervical nerves at C2 (under xray).   I will see you back in 6-8 weeks. Please do not hesitate to call if you have any questions or concerns. You can also message me in MyChart.   Glade Boys PA-C [phone]     The physicians and staff at [facility] at [facility] are committed to providing excellent care. You may receive a survey asking for feedback about your experience at our office. We value you your feedback and appreciate you taking the time to to fill it out. The [facility] leadership team is also available to discuss your experience in person, feel free to contact us  [phone].

## 2024-06-04 ENCOUNTER — Ambulatory Visit: Attending: Orthopedic Surgery

## 2024-06-04 DIAGNOSIS — R29898 Other symptoms and signs involving the musculoskeletal system: Secondary | ICD-10-CM | POA: Insufficient documentation

## 2024-06-04 DIAGNOSIS — M542 Cervicalgia: Secondary | ICD-10-CM | POA: Diagnosis not present

## 2024-06-04 DIAGNOSIS — M25612 Stiffness of left shoulder, not elsewhere classified: Secondary | ICD-10-CM | POA: Diagnosis not present

## 2024-06-04 NOTE — Therapy (Signed)
 OUTPATIENT PHYSICAL THERAPY CERVICAL EVALUATION/TREATMENT   Patient Name: Ruth Woods MRN: [id number] DOB:1966-06-15, 58 y.o., female Today's Date: 06/04/2024  END OF SESSION:  PT End of Session - 06/04/24 1323     Visit Number 1    Number of Visits 25    Date for PT Re-Evaluation 08/27/24    Progress Note Due on Visit 10    PT Start Time 1318    PT Stop Time 1400    PT Time Calculation (min) 42 min    Activity Tolerance Patient tolerated treatment well    Behavior During Therapy [facility] for tasks assessed/performed          Past Medical History:  Diagnosis Date   Allergy 2006   Levaquin   Anxiety    Arthritis 2010   Carpal tunnel syndrome 10/08/2018   Collagen vascular disease (HCC)    GERD (gastroesophageal reflux disease)    Lupus    Polyarthralgia 05/28/2019   Sleep apnea    Thyroid  disease 2006   Past Surgical History:  Procedure Laterality Date   ABDOMINAL HYSTERECTOMY     CESAREAN SECTION  1992   CHOLECYSTECTOMY     COSMETIC SURGERY  1987   Patient Active Problem List   Diagnosis Date Noted   Neck pain on left side 03/26/2024   Chronic pansinusitis 07/31/2023   Eustachian tube dysfunction, bilateral 07/31/2023   Adjustment disorder with mixed anxiety and depressed mood 07/31/2023   Grief 07/31/2023   Primary hypertension 07/31/2023   Annual physical exam 05/22/2023   Healthcare maintenance 05/22/2023   Morbid obesity (HCC) 05/22/2023   Tick bite of left forearm 05/22/2023   Night sweats 05/22/2023   Long term (current) use of non-steroidal anti-inflammatories (nsaid) 02/20/2023   Petechiae 02/20/2023   Spinal stenosis of lumbar region without neurogenic claudication 02/20/2023   Multinodular goiter 02/20/2023   Insomnia secondary to chronic pain 02/20/2023   Multiple sclerosis (HCC) 04/17/2022   Central sleep apnea 04/17/2022   Other fatigue 04/17/2022   Word finding difficulty 04/17/2022   Optic neuritis 04/03/2022   Discoid lupus 2010     Primary osteoarthritis involving multiple joints 06/19/2019   Obesity, Class III, BMI 40-49.9 (morbid obesity) 05/28/2019   Vitamin D  deficiency 05/28/2019   Encounter for long-term (current) use of high-risk medication 05/28/2019   Bilateral dry eyes 05/28/2019    PCP: Sharma Coyer, MD   REFERRING PROVIDER: Hilma Hastings, PA-C   REFERRING DIAG: M54.2 (ICD-10-CM) - Neck pain   THERAPY DIAG:  Cervical pain  Decreased ROM of neck  Decreased ROM of left shoulder  Rationale for Evaluation and Treatment: Rehabilitation  ONSET DATE: 02/01/2024  SUBJECTIVE:  SUBJECTIVE STATEMENT:  Pt reports she was having headaches every single day, so she sought to have a nerve block that was performed and she noted a reduction in her headaches, but immediately following the injection, she started having a dull ache in the L side of the neck.  Pt reports it feels as though its near the salivary gland and radiates towards the base of her skull.  She feels as though she has traded off the headache pain for this L sided neck pain.  Pt was told she could have another injection, however is somewhat concerned about doing this due to the previous outcome.    Hand dominance: Right  PERTINENT HISTORY:   [name] has a history of discoid lupus, OSA, thyroid  disease, MS, chronic headaches, HTN, obesity.   PAIN:  Are you having pain? Yes: NPRS scale: 4/10 Pain location: L sided neck Pain description: dull achey Aggravating factors: certain movements,  Relieving factors: massage therapy every other week with distraction  PRECAUTIONS: None  RED FLAGS: Cervical red flags: pt has difficulty swallowing and breathing due to the enlarged goiter of the neck and Bowel or bladder  incontinence: Yes: pt reports she has some back pain that causes her to not be able to tell when she urinates.    WEIGHT BEARING RESTRICTIONS: No  FALLS:  Has patient fallen in last 6 months? No  LIVING ENVIRONMENT: Lives with: lives with their spouse Lives in: House/apartment Stairs: Yes: External: 3 steps; none Has following equipment at home: Single point cane  OCCUPATION: Not currently working.  PLOF: Independent, however has MS and needs assistance from husband to get dressed on the bad days.  PATIENT GOALS: To get rid of the pain that's in the L side of the neck.  NEXT MD VISIT: a month later, sometime in August.  OBJECTIVE:  Note: Objective measures were completed at Evaluation unless otherwise noted.  DIAGNOSTIC FINDINGS:   EXAM: CERVICAL SPINE - COMPLETE 4+ VIEW  IMPRESSION: 1. Minimal degenerative joint changes of cervical spine. 2. Incidental finding of a 2.9 cm oval calcified soft tissue nodule in the right mid neck question calcified thyroid  nodule. Correlating to patient's known multinodular goiter.   PATIENT SURVEYS:  NDI:  NECK DISABILITY INDEX  Date: 06/04/24 Score  Pain intensity 2 = The pain is moderate at the moment  2. Personal care (washing, dressing, etc.) 0 = I can look after myself normally without causing extra pain  3. Lifting 0 =  I can lift heavy weights without extra pain  4. Reading 3 = I can't read as much as I want because of moderate pain in my neck  5. Headaches 1 =  I have slight headaches, which come infrequently  6. Concentration 3 = I have a lot of difficulty in concentrating when I want to  7. Work 3 =  I cannot do my usual work  8. Driving 3 = I can't drive my car as long as I want because of moderate pain in my neck  9. Sleeping 1 = My sleep is slightly disturbed (less than 1 hr sleepless)  10. Recreation 3 = I am able to engage in a few of my usual recreation activities because of pain in   my neck  Total 19/50; 38%    Minimum Detectable Change (90% confidence): 5 points or 10% points  COGNITION: Overall cognitive status: Within functional limits for tasks assessed  SENSATION: WFL  POSTURE: rounded shoulders, forward head, and posterior pelvic  tilt  PALPATION: Tightness noted in the L UT's much greater than the R    CERVICAL ROM:   Active ROM A/PROM (deg) eval  Flexion 41  Extension 48  Right lateral flexion 30  Left lateral flexion 26  Right rotation 56  Left rotation 46  (Blank rows = not tested)  UPPER EXTREMITY ROM:  Active ROM Right eval Left eval  Shoulder flexion 157 137  Shoulder extension 168 167   (Blank rows = not tested)  UPPER EXTREMITY MMT:  MMT Right eval Left eval  Shoulder flexion 4 4-  Shoulder extension 4 4-   (Blank rows = not tested)    TREATMENT DATE: 06/04/24  Manual:  Supine STM to cervical region to increase extensibility of the paraspinals Supine suboccipital release technique to decrease cervicalgia Supine manual traction performed in order to increase joint space in cervical region for pain relief Supine cervical upglides/downglides, 30 sec bouts Supine UT/Levator stretch, 30 sec bouts to increase tissue extensibility of the cervical region                                                                                                                                  PATIENT EDUCATION:  Education details: Pt educated on role of PT and services provided during current POC, along with prognosis and information about the clinic. Person educated: Patient Education method: Explanation Education comprehension: verbalized understanding  HOME EXERCISE PROGRAM:  TBD at the next visit.  ASSESSMENT:  CLINICAL IMPRESSION: Patient is a 58 y.o. female who was seen today for physical therapy evaluation and treatment for L sided neck pain with reduced ROM of the neck as well.  Upon evaluation, pt found to have reduced strength in the L UE,  reduced ROM of the L shoulder, generalized reduced ROM of the neck with mobility, and pain with mobility of the neck as well.  Pt is very guarded with movements, which is also likely playing a part into her pain levels at this time.  Pt will benefit from skilled therapy to address mobility tolerance, ROM, pain, and strength impairments necessary for improvement in quality of life.  Pt. demonstrates understanding of this plan of care and agrees with this plan.     OBJECTIVE IMPAIRMENTS: decreased activity tolerance, decreased ROM, decreased strength, hypomobility, and pain.   ACTIVITY LIMITATIONS: carrying and lifting  PARTICIPATION LIMITATIONS: driving  PERSONAL FACTORS: Fitness, Past/current experiences, and 3+ comorbidities: anxiety, lupus, thyroid  disease, sleep apnea are also affecting patient's functional outcome.   REHAB POTENTIAL: Good  CLINICAL DECISION MAKING: Evolving/moderate complexity  EVALUATION COMPLEXITY: Low   GOALS: Goals reviewed with patient? Yes  SHORT TERM GOALS: Target date: 07/02/2024  Pt will be independent with HEP in order to demonstrate increased ability to perform tasks related to occupation/hobbies. Baseline: to be given at subsequent visit. Goal status: INITIAL   LONG TERM GOALS: Target date: 08/27/2024  1.  Pt will reduce their Neck  Disability Index (NDI) score by at least 10 points through adherence to a physical therapy program to improve function and decrease neck-related limitations in daily activities Baseline: 19/50; 38% Goal status: INITIAL  2. Pt will increase active cervical left rotation to at least 60, through a targeted cervical mobility program in order to support improved driving visibility and daily head-turning tasks. Baseline: 46 deg Goal status: INITIAL  3.  Pt will improve active left shoulder flexion to at least 160, using therapeutic exercise focused on shoulder mobility, scapular stabilization, and rotator cuff activation,  to enhance ability to reach overhead during ADLs. Baseline: 137 deg Goal status: INITIAL  4. Pt will increase active cervical flexion to at least 55, to support pain-free reading, looking down at objects, and performing daily tasks, through consistent stretching, strengthening, and manual therapy." Baseline: 41 deg Goal status:  INITIAL   PLAN:  PT FREQUENCY: 1-2x/week  PT DURATION: 12 weeks  PLANNED INTERVENTIONS: 97750- Physical Performance Testing, 97110-Therapeutic exercises, 97530- Therapeutic activity, [id number]- Neuromuscular re-education, 97535- Self Care, [id number]- Manual therapy, [id number]- Canalith repositioning, [id number]- Traction (mechanical), Joint mobilization, Joint manipulation, Spinal manipulation, Spinal mobilization, Vestibular training, Cryotherapy, and Moist heat  PLAN FOR NEXT SESSION:   Continue with manual therapy approach Establish an HEP! Cervical mobilization and traction seems to be most beneficial.   Fonda Simpers, PT, DPT Physical Therapist - [facility]  06/04/24, 6:40 PM

## 2024-06-11 ENCOUNTER — Ambulatory Visit

## 2024-06-11 DIAGNOSIS — R29898 Other symptoms and signs involving the musculoskeletal system: Secondary | ICD-10-CM

## 2024-06-11 DIAGNOSIS — M542 Cervicalgia: Secondary | ICD-10-CM

## 2024-06-11 DIAGNOSIS — M25612 Stiffness of left shoulder, not elsewhere classified: Secondary | ICD-10-CM

## 2024-06-11 NOTE — Therapy (Signed)
 OUTPATIENT PHYSICAL THERAPY CERVICAL TREATMENT   Patient Name: Ruth Woods MRN: [id number] DOB:19-Aug-1966, 58 y.o., female Today's Date: 06/11/2024  END OF SESSION:  PT End of Session - 06/11/24 1618     Visit Number 2    Number of Visits 25    Date for PT Re-Evaluation 08/27/24    Progress Note Due on Visit 10    PT Start Time 1618    PT Stop Time 1700    PT Time Calculation (min) 42 min    Activity Tolerance Patient tolerated treatment well    Behavior During Therapy [facility] for tasks assessed/performed           Past Medical History:  Diagnosis Date   Allergy 2006   Levaquin   Anxiety    Arthritis 2010   Carpal tunnel syndrome 10/08/2018   Collagen vascular disease (HCC)    GERD (gastroesophageal reflux disease)    Lupus    Polyarthralgia 05/28/2019   Sleep apnea    Thyroid  disease 2006   Past Surgical History:  Procedure Laterality Date   ABDOMINAL HYSTERECTOMY     CESAREAN SECTION  1992   CHOLECYSTECTOMY     COSMETIC SURGERY  1987   Patient Active Problem List   Diagnosis Date Noted   Neck pain on left side 03/26/2024   Chronic pansinusitis 07/31/2023   Eustachian tube dysfunction, bilateral 07/31/2023   Adjustment disorder with mixed anxiety and depressed mood 07/31/2023   Grief 07/31/2023   Primary hypertension 07/31/2023   Annual physical exam 05/22/2023   Healthcare maintenance 05/22/2023   Morbid obesity (HCC) 05/22/2023   Tick bite of left forearm 05/22/2023   Night sweats 05/22/2023   Long term (current) use of non-steroidal anti-inflammatories (nsaid) 02/20/2023   Petechiae 02/20/2023   Spinal stenosis of lumbar region without neurogenic claudication 02/20/2023   Multinodular goiter 02/20/2023   Insomnia secondary to chronic pain 02/20/2023   Multiple sclerosis (HCC) 04/17/2022   Central sleep apnea 04/17/2022   Other fatigue 04/17/2022   Word finding difficulty 04/17/2022   Optic neuritis 04/03/2022   Discoid lupus 2010     Primary osteoarthritis involving multiple joints 06/19/2019   Obesity, Class III, BMI 40-49.9 (morbid obesity) 05/28/2019   Vitamin D  deficiency 05/28/2019   Encounter for long-term (current) use of high-risk medication 05/28/2019   Bilateral dry eyes 05/28/2019    PCP: Sharma Coyer, MD   REFERRING PROVIDER: Hilma Hastings, PA-C   REFERRING DIAG: M54.2 (ICD-10-CM) - Neck pain   THERAPY DIAG:  Cervical pain  Decreased ROM of neck  Decreased ROM of left shoulder  Rationale for Evaluation and Treatment: Rehabilitation  ONSET DATE: 02/01/2024  SUBJECTIVE:  SUBJECTIVE STATEMENT:  From Today: Patient reports doing okay considering. She reports 4/10- Left neck and jaw pain.    From Eval: Pt reports she was having headaches every single day, so she sought to have a nerve block that was performed and she noted a reduction in her headaches, but immediately following the injection, she started having a dull ache in the L side of the neck.  Pt reports it feels as though its near the salivary gland and radiates towards the base of her skull.  She feels as though she has traded off the headache pain for this L sided neck pain.  Pt was told she could have another injection, however is somewhat concerned about doing this due to the previous outcome.    Hand dominance: Right  PERTINENT HISTORY:   Ruth Woods has a history of discoid lupus, OSA, thyroid  disease, MS, chronic headaches, HTN, obesity.   PAIN:  Are you having pain? Yes: NPRS scale: 4/10 Pain location: L sided neck Pain description: dull achey Aggravating factors: certain movements,  Relieving factors: massage therapy every other week with distraction  PRECAUTIONS: None  RED FLAGS: Cervical red flags: pt has  difficulty swallowing and breathing due to the enlarged goiter of the neck and Bowel or bladder incontinence: Yes: pt reports she has some back pain that causes her to not be able to tell when she urinates.    WEIGHT BEARING RESTRICTIONS: No  FALLS:  Has patient fallen in last 6 months? No  LIVING ENVIRONMENT: Lives with: lives with their spouse Lives in: House/apartment Stairs: Yes: External: 3 steps; none Has following equipment at home: Single point cane  OCCUPATION: Not currently working.  PLOF: Independent, however has MS and needs assistance from husband to get dressed on the bad days.  PATIENT GOALS: To get rid of the pain that's in the L side of the neck.  NEXT MD VISIT: a month later, sometime in August.  OBJECTIVE:  Note: Objective measures were completed at Evaluation unless otherwise noted.  DIAGNOSTIC FINDINGS:   EXAM: CERVICAL SPINE - COMPLETE 4+ VIEW  IMPRESSION: 1. Minimal degenerative joint changes of cervical spine. 2. Incidental finding of a 2.9 cm oval calcified soft tissue nodule in the right mid neck question calcified thyroid  nodule. Correlating to patient's known multinodular goiter.   PATIENT SURVEYS:  NDI:  NECK DISABILITY INDEX  Date: 06/04/24 Score  Pain intensity 2 = The pain is moderate at the moment  2. Personal care (washing, dressing, etc.) 0 = I can look after myself normally without causing extra pain  3. Lifting 0 =  I can lift heavy weights without extra pain  4. Reading 3 = I can't read as much as I want because of moderate pain in my neck  5. Headaches 1 =  I have slight headaches, which come infrequently  6. Concentration 3 = I have a lot of difficulty in concentrating when I want to  7. Work 3 =  I cannot do my usual work  8. Driving 3 = I can't drive my car as long as I want because of moderate pain in my neck  9. Sleeping 1 = My sleep is slightly disturbed (less than 1 hr sleepless)  10. Recreation 3 = I am able to engage in  a few of my usual recreation activities because of pain in   my neck  Total 19/50; 38%   Minimum Detectable Change (90% confidence): 5 points or 10% points  COGNITION: Overall  cognitive status: Within functional limits for tasks assessed  SENSATION: WFL  POSTURE: rounded shoulders, forward head, and posterior pelvic tilt  PALPATION: Tightness noted in the L UT's much greater than the R    CERVICAL ROM:   Active ROM A/PROM (deg) eval  Flexion 41  Extension 48  Right lateral flexion 30  Left lateral flexion 26  Right rotation 56  Left rotation 46  (Blank rows = not tested)  UPPER EXTREMITY ROM:  Active ROM Right eval Left eval  Shoulder flexion 157 137  Shoulder extension 168 167   (Blank rows = not tested)  UPPER EXTREMITY MMT:  MMT Right eval Left eval  Shoulder flexion 4 4-  Shoulder extension 4 4-   (Blank rows = not tested)    TREATMENT DATE: 06/11/24   Self care/Home management:  -Spent significant time discussing patient condition- Using images from google to explain cervical level and how spinal nerves control muscle function. Discussed dermatomes/myotomes, stenosis, impingement on nerves at different levels and how the upper cervical region can potentially refer to head/jaw pain.  Reviewed some basic videos from medbridge for HEP  and issued a basic beginner HEP today.    THEREX:  Instructed patient in some basic cervical stretches: -Cervical Rotation- AROM x 10  -Cervical Rotation- using towel- hold 30 sec x 3 each side -Cervical lateral flexion- UT stretch- hold 30 sec x 3 each side. Focused on gentle stretch  as using one to pull increases shoulder pain so demonstrated how to stretch without reaching with arm overhead as well as how to lock shoulder down by placing behind her back vs. Placing hand under leg or holding onto seat of chair.  -Cervical Levator scap stretch- hold 30 sec x 3 ea side.                                                                                                                                    PATIENT EDUCATION:  Education details: See above section- self care for specifics. Person educated: Patient Education method: Explanation Education comprehension: verbalized understanding  HOME EXERCISE PROGRAM:  Access Code: [email or web address] URL: [email or web address] Date: 06/11/2024 Prepared by: Reyes London  Exercises - Seated Upper Trap Stretch  - 1-2 x daily - 3 sets - 30 sec hold - Gentle Levator Scapulae Stretch  - 1-2 x daily - 3 sets - 30 sec hold - Seated Assisted Cervical Rotation with Towel  - 1-2 x daily - 3 sets - 30 hold  ASSESSMENT:  CLINICAL IMPRESSION: Patient is a 58 y.o. female who was seen today for physical therapy treatment for L sided neck pain with reduced ROM of the neck as well.  Spent significant portion of session discussing anatomy and physiology of upper cervical muscles/nerves and findings from recent cervical x ray and previous MRI. She actually performed well with all instruction today and able to respond well to all stretching with some relief.  She will benefit from review and resume manual therapy for pain/ROM next visit.  Pt will benefit from skilled therapy to address mobility tolerance, ROM, pain, and strength impairments necessary for improvement in quality of life.  Pt. demonstrates understanding of this plan of care and agrees with this plan.     OBJECTIVE IMPAIRMENTS: decreased activity tolerance, decreased ROM, decreased strength, hypomobility, and pain.   ACTIVITY LIMITATIONS: carrying and lifting  PARTICIPATION LIMITATIONS: driving  PERSONAL FACTORS: Fitness, Past/current experiences, and 3+ comorbidities: anxiety, lupus, thyroid  disease, sleep apnea are also affecting patient's functional outcome.   REHAB POTENTIAL: Good  CLINICAL DECISION MAKING: Evolving/moderate complexity  EVALUATION COMPLEXITY: Low   GOALS: Goals reviewed with  patient? Yes  SHORT TERM GOALS: Target date: 07/02/2024  Pt will be independent with HEP in order to demonstrate increased ability to perform tasks related to occupation/hobbies. Baseline: to be given at subsequent visit. Goal status: INITIAL   LONG TERM GOALS: Target date: 08/27/2024  1.  Pt will reduce their Neck Disability Index (NDI) score by at least 10 points through adherence to a physical therapy program to improve function and decrease neck-related limitations in daily activities Baseline: 19/50; 38% Goal status: INITIAL  2. Pt will increase active cervical left rotation to at least 60, through a targeted cervical mobility program in order to support improved driving visibility and daily head-turning tasks. Baseline: 46 deg Goal status: INITIAL  3.  Pt will improve active left shoulder flexion to at least 160, using therapeutic exercise focused on shoulder mobility, scapular stabilization, and rotator cuff activation, to enhance ability to reach overhead during ADLs. Baseline: 137 deg Goal status: INITIAL  4. Pt will increase active cervical flexion to at least 55, to support pain-free reading, looking down at objects, and performing daily tasks, through consistent stretching, strengthening, and manual therapy." Baseline: 41 deg Goal status:  INITIAL   PLAN:  PT FREQUENCY: 1-2x/week  PT DURATION: 12 weeks  PLANNED INTERVENTIONS: 97750- Physical Performance Testing, 97110-Therapeutic exercises, 97530- Therapeutic activity, 97112- Neuromuscular re-education, 97535- Self Care, 02859- Manual therapy, [phone]- Canalith repositioning, 97012- Traction (mechanical), Joint mobilization, Joint manipulation, Spinal manipulation, Spinal mobilization, Vestibular training, Cryotherapy, and Moist heat  PLAN FOR NEXT SESSION:   manual therapy for pain relief and cervical ROM Review and progress  HEP   Chyrl London, PT Physical Therapist - [facility]  [facility]  06/11/24, 6:01 PM

## 2024-06-15 ENCOUNTER — Ambulatory Visit

## 2024-06-15 DIAGNOSIS — M542 Cervicalgia: Secondary | ICD-10-CM

## 2024-06-15 DIAGNOSIS — M25612 Stiffness of left shoulder, not elsewhere classified: Secondary | ICD-10-CM | POA: Diagnosis not present

## 2024-06-15 DIAGNOSIS — R29898 Other symptoms and signs involving the musculoskeletal system: Secondary | ICD-10-CM

## 2024-06-15 NOTE — Therapy (Signed)
 OUTPATIENT PHYSICAL THERAPY CERVICAL TREATMENT   Patient Name: Ruth Woods MRN: [id number] DOB:[birth year], 58 y.o., female Today's Date: 06/15/2024  END OF SESSION:  PT End of Session - 06/15/24 1533     Visit Number 3    Number of Visits 25    Date for PT Re-Evaluation 08/27/24    Progress Note Due on Visit 10    PT Start Time 1534    PT Stop Time 1615    PT Time Calculation (min) 41 min    Activity Tolerance Patient tolerated treatment well    Behavior During Therapy [facility] for tasks assessed/performed           Past Medical History:  Diagnosis Date   Allergy 2006   Levaquin   Anxiety    Arthritis 2010   Carpal tunnel syndrome 10/08/2018   Collagen vascular disease (HCC)    GERD (gastroesophageal reflux disease)    Lupus    Polyarthralgia 05/28/2019   Sleep apnea    Thyroid  disease 2006   Past Surgical History:  Procedure Laterality Date   ABDOMINAL HYSTERECTOMY     CESAREAN SECTION  1992   CHOLECYSTECTOMY     COSMETIC SURGERY  1987   Patient Active Problem List   Diagnosis Date Noted   Neck pain on left side 03/26/2024   Chronic pansinusitis 07/31/2023   Eustachian tube dysfunction, bilateral 07/31/2023   Adjustment disorder with mixed anxiety and depressed mood 07/31/2023   Grief 07/31/2023   Primary hypertension 07/31/2023   Annual physical exam 05/22/2023   Healthcare maintenance 05/22/2023   Morbid obesity (HCC) 05/22/2023   Tick bite of left forearm 05/22/2023   Night sweats 05/22/2023   Long term (current) use of non-steroidal anti-inflammatories (nsaid) 02/20/2023   Petechiae 02/20/2023   Spinal stenosis of lumbar region without neurogenic claudication 02/20/2023   Multinodular goiter 02/20/2023   Insomnia secondary to chronic pain 02/20/2023   Multiple sclerosis (HCC) 04/17/2022   Central sleep apnea 04/17/2022   Other fatigue 04/17/2022   Word finding difficulty 04/17/2022   Optic neuritis 04/03/2022   Discoid lupus 2010     Primary osteoarthritis involving multiple joints 06/19/2019   Obesity, Class III, BMI 40-49.9 (morbid obesity) 05/28/2019   Vitamin D  deficiency 05/28/2019   Encounter for long-term (current) use of high-risk medication 05/28/2019   Bilateral dry eyes 05/28/2019    PCP: Sharma Coyer, MD   REFERRING PROVIDER: Hilma Hastings, PA-C   REFERRING DIAG: M54.2 (ICD-10-CM) - Neck pain   THERAPY DIAG:  Cervical pain  Decreased ROM of neck  Decreased ROM of left shoulder  Rationale for Evaluation and Treatment: Rehabilitation  ONSET DATE: 02/01/2024  SUBJECTIVE:  SUBJECTIVE STATEMENT:  Pt reports 4/10 pain upon arrival.  Pt notes that she was pressure washing the garage over the weekend.     From Eval: Pt reports she was having headaches every single day, so she sought to have a nerve block that was performed and she noted a reduction in her headaches, but immediately following the injection, she started having a dull ache in the L side of the neck.  Pt reports it feels as though its near the salivary gland and radiates towards the base of her skull.  She feels as though she has traded off the headache pain for this L sided neck pain.  Pt was told she could have another injection, however is somewhat concerned about doing this due to the previous outcome.    Hand dominance: Right  PERTINENT HISTORY:   Ms. Aurelie Dicenzo has a history of discoid lupus, OSA, thyroid  disease, MS, chronic headaches, HTN, obesity.   PAIN:  Are you having pain? Yes: NPRS scale: 4/10 Pain location: L sided neck Pain description: dull achey Aggravating factors: certain movements,  Relieving factors: massage therapy every other week with distraction  PRECAUTIONS: None  RED FLAGS: Cervical red  flags: pt has difficulty swallowing and breathing due to the enlarged goiter of the neck and Bowel or bladder incontinence: Yes: pt reports she has some back pain that causes her to not be able to tell when she urinates.    WEIGHT BEARING RESTRICTIONS: No  FALLS:  Has patient fallen in last 6 months? No  LIVING ENVIRONMENT: Lives with: lives with their spouse Lives in: House/apartment Stairs: Yes: External: 3 steps; none Has following equipment at home: Single point cane  OCCUPATION: Not currently working.  PLOF: Independent, however has MS and needs assistance from husband to get dressed on the bad days.  PATIENT GOALS: To get rid of the pain that's in the L side of the neck.  NEXT MD VISIT: a month later, sometime in August.  OBJECTIVE:  Note: Objective measures were completed at Evaluation unless otherwise noted.  DIAGNOSTIC FINDINGS:   EXAM: CERVICAL SPINE - COMPLETE 4+ VIEW  IMPRESSION: 1. Minimal degenerative joint changes of cervical spine. 2. Incidental finding of a 2.9 cm oval calcified soft tissue nodule in the right mid neck question calcified thyroid  nodule. Correlating to patient's known multinodular goiter.   PATIENT SURVEYS:  NDI:  NECK DISABILITY INDEX  Date: 06/04/24 Score  Pain intensity 2 = The pain is moderate at the moment  2. Personal care (washing, dressing, etc.) 0 = I can look after myself normally without causing extra pain  3. Lifting 0 =  I can lift heavy weights without extra pain  4. Reading 3 = I can't read as much as I want because of moderate pain in my neck  5. Headaches 1 =  I have slight headaches, which come infrequently  6. Concentration 3 = I have a lot of difficulty in concentrating when I want to  7. Work 3 =  I cannot do my usual work  8. Driving 3 = I can't drive my car as long as I want because of moderate pain in my neck  9. Sleeping 1 = My sleep is slightly disturbed (less than 1 hr sleepless)  10. Recreation 3 = I am  able to engage in a few of my usual recreation activities because of pain in   my neck  Total 19/50; 38%   Minimum Detectable Change (90% confidence): 5 points or  10% points  COGNITION: Overall cognitive status: Within functional limits for tasks assessed  SENSATION: WFL  POSTURE: rounded shoulders, forward head, and posterior pelvic tilt  PALPATION: Tightness noted in the L UT's much greater than the R    CERVICAL ROM:   Active ROM A/PROM (deg) eval AROM (deg) 06/15/24  Flexion 41 48  Extension 48 32  Right lateral flexion 30 32  Left lateral flexion 26 36  Right rotation 56 60  Left rotation 46 50  (Blank rows = not tested)  UPPER EXTREMITY ROM:  Active ROM Right eval Left eval  Shoulder flexion 157 137  Shoulder abduction 168 167   (Blank rows = not tested)  UPPER EXTREMITY MMT:  MMT Right eval Left eval  Shoulder flexion 4 4-  Shoulder extension 4 4-   (Blank rows = not tested)    TREATMENT DATE: 06/15/24  Manual:  Supine STM to cervical region to increase extensibility of the paraspinals Supine suboccipital release technique to decrease cervicalgia Supine manual traction performed in order to increase joint space in cervical region for pain relief Supine cervical upglides/downglides, 30 sec bouts Supine UT/Levator stretch, 30 sec bouts to increase tissue extensibility of the cervical region  SCM stretch applied and given as part of updated HEP as well, 30 sec bouts x3, focusing on the L side                                                                                                                                   PATIENT EDUCATION:  Education details: See above section- self care for specifics. Person educated: Patient Education method: Explanation Education comprehension: verbalized understanding  HOME EXERCISE PROGRAM:  Access Code: [email or web address] URL: [email or web address] Date: 06/11/2024 Prepared by: Reyes London  Exercises - Seated Upper Trap Stretch  - 1-2 x daily - 3 sets - 30 sec hold - Gentle Levator Scapulae Stretch  - 1-2 x daily - 3 sets - 30 sec hold - Seated Assisted Cervical Rotation with Towel  - 1-2 x daily - 3 sets - 30 hold  ASSESSMENT:  CLINICAL IMPRESSION:  Pt responded well to the therapy approach and was able to improve from 4/10 pain level to 2/10 pain by the end of the session.  Pt noted the SCM stretch to be targeting the areas that she has been having the most complications with.  Pt ultimately will improve pain levels while improving ROM of the L shoulder and cervical region as well.  Pt has made progress towards ROM goals as noted above as well.   Pt will continue to benefit from skilled therapy to address remaining deficits in order to improve overall QoL and return to PLOF.       OBJECTIVE IMPAIRMENTS: decreased activity tolerance, decreased ROM, decreased strength, hypomobility, and pain.   ACTIVITY LIMITATIONS: carrying and lifting  PARTICIPATION LIMITATIONS: driving  PERSONAL FACTORS: Fitness, Past/current experiences, and 3+ comorbidities: anxiety, lupus,  thyroid  disease, sleep apnea are also affecting patient's functional outcome.   REHAB POTENTIAL: Good  CLINICAL DECISION MAKING: Evolving/moderate complexity  EVALUATION COMPLEXITY: Low   GOALS: Goals reviewed with patient? Yes  SHORT TERM GOALS: Target date: 07/02/2024  Pt will be independent with HEP in order to demonstrate increased ability to perform tasks related to occupation/hobbies. Baseline: to be given at subsequent visit. Goal status: INITIAL   LONG TERM GOALS: Target date: 08/27/2024  1.  Pt will reduce their Neck Disability Index (NDI) score by at least 10 points through adherence to a physical therapy program to improve function and decrease neck-related limitations in daily activities Baseline: 19/50; 38% Goal status: INITIAL  2. Pt will increase active cervical left  rotation to at least 60, through a targeted cervical mobility program in order to support improved driving visibility and daily head-turning tasks. Baseline: 46 deg Goal status: INITIAL  3.  Pt will improve active left shoulder flexion to at least 160, using therapeutic exercise focused on shoulder mobility, scapular stabilization, and rotator cuff activation, to enhance ability to reach overhead during ADLs. Baseline: 137 deg Goal status: INITIAL  4. Pt will increase active cervical flexion to at least 55, to support pain-free reading, looking down at objects, and performing daily tasks, through consistent stretching, strengthening, and manual therapy." Baseline: 41 deg Goal status:  INITIAL   PLAN:  PT FREQUENCY: 1-2x/week  PT DURATION: 12 weeks  PLANNED INTERVENTIONS: 97750- Physical Performance Testing, 97110-Therapeutic exercises, 97530- Therapeutic activity, 97112- Neuromuscular re-education, 97535- Self Care, 02859- Manual therapy, [phone]- Canalith repositioning, 97012- Traction (mechanical), Joint mobilization, Joint manipulation, Spinal manipulation, Spinal mobilization, Vestibular training, Cryotherapy, and Moist heat  PLAN FOR NEXT SESSION:   manual therapy for pain relief and cervical ROM Review and progress  HEP   Fonda Simpers, PT, DPT Physical Therapist - [facility]  [facility]  06/15/24, 3:34 PM

## 2024-06-25 ENCOUNTER — Ambulatory Visit: Attending: Orthopedic Surgery

## 2024-06-25 DIAGNOSIS — R29898 Other symptoms and signs involving the musculoskeletal system: Secondary | ICD-10-CM | POA: Insufficient documentation

## 2024-06-25 DIAGNOSIS — M542 Cervicalgia: Secondary | ICD-10-CM | POA: Insufficient documentation

## 2024-06-25 DIAGNOSIS — M25612 Stiffness of left shoulder, not elsewhere classified: Secondary | ICD-10-CM | POA: Insufficient documentation

## 2024-06-25 NOTE — Therapy (Signed)
 OUTPATIENT PHYSICAL THERAPY CERVICAL TREATMENT   Patient Name: Ruth Woods MRN: [id number] DOB:1966/05/28, 58 y.o., female Today's Date: 06/25/2024  END OF SESSION:  PT End of Session - 06/25/24 1534     Visit Number 4    Number of Visits 25    Date for PT Re-Evaluation 08/27/24    Progress Note Due on Visit 10    PT Start Time 1531    PT Stop Time 1615    PT Time Calculation (min) 44 min    Activity Tolerance Patient tolerated treatment well    Behavior During Therapy [facility] for tasks assessed/performed            Past Medical History:  Diagnosis Date   Allergy 2006   Levaquin   Anxiety    Arthritis 2010   Carpal tunnel syndrome 10/08/2018   Collagen vascular disease (HCC)    GERD (gastroesophageal reflux disease)    Lupus    Polyarthralgia 05/28/2019   Sleep apnea    Thyroid  disease 2006   Past Surgical History:  Procedure Laterality Date   ABDOMINAL HYSTERECTOMY     CESAREAN SECTION  1992   CHOLECYSTECTOMY     COSMETIC SURGERY  1987   Patient Active Problem List   Diagnosis Date Noted   Neck pain on left side 03/26/2024   Chronic pansinusitis 07/31/2023   Eustachian tube dysfunction, bilateral 07/31/2023   Adjustment disorder with mixed anxiety and depressed mood 07/31/2023   Grief 07/31/2023   Primary hypertension 07/31/2023   Annual physical exam 05/22/2023   Healthcare maintenance 05/22/2023   Morbid obesity (HCC) 05/22/2023   Tick bite of left forearm 05/22/2023   Night sweats 05/22/2023   Long term (current) use of non-steroidal anti-inflammatories (nsaid) 02/20/2023   Petechiae 02/20/2023   Spinal stenosis of lumbar region without neurogenic claudication 02/20/2023   Multinodular goiter 02/20/2023   Insomnia secondary to chronic pain 02/20/2023   Multiple sclerosis (HCC) 04/17/2022   Central sleep apnea 04/17/2022   Other fatigue 04/17/2022   Word finding difficulty 04/17/2022   Optic neuritis 04/03/2022   Discoid lupus 2010     Primary osteoarthritis involving multiple joints 06/19/2019   Obesity, Class III, BMI 40-49.9 (morbid obesity) 05/28/2019   Vitamin D  deficiency 05/28/2019   Encounter for long-term (current) use of high-risk medication 05/28/2019   Bilateral dry eyes 05/28/2019    PCP: Sharma Coyer, MD   REFERRING PROVIDER: Hilma Hastings, PA-C   REFERRING DIAG: M54.2 (ICD-10-CM) - Neck pain   THERAPY DIAG:  Cervical pain  Decreased ROM of neck  Decreased ROM of left shoulder  Rationale for Evaluation and Treatment: Rehabilitation  ONSET DATE: 02/01/2024  SUBJECTIVE:  SUBJECTIVE STATEMENT:  Pt reports pain to be a 4/10 upon arrival.  Pt notes she felt good after the last session but after going home and being consistent with her HEP she notes the pain comes back.     From Eval: Pt reports she was having headaches every single day, so she sought to have a nerve block that was performed and she noted a reduction in her headaches, but immediately following the injection, she started having a dull ache in the L side of the neck.  Pt reports it feels as though its near the salivary gland and radiates towards the base of her skull.  She feels as though she has traded off the headache pain for this L sided neck pain.  Pt was told she could have another injection, however is somewhat concerned about doing this due to the previous outcome.    Hand dominance: Right  PERTINENT HISTORY:   Ruth Woods has a history of discoid lupus, OSA, thyroid  disease, MS, chronic headaches, HTN, obesity.   PAIN:  Are you having pain? Yes: NPRS scale: 4/10 Pain location: L sided neck Pain description: dull achey Aggravating factors: certain movements,  Relieving factors: massage therapy every other  week with distraction  PRECAUTIONS: None  RED FLAGS: Cervical red flags: pt has difficulty swallowing and breathing due to the enlarged goiter of the neck and Bowel or bladder incontinence: Yes: pt reports she has some back pain that causes her to not be able to tell when she urinates.    WEIGHT BEARING RESTRICTIONS: No  FALLS:  Has patient fallen in last 6 months? No  LIVING ENVIRONMENT: Lives with: lives with their spouse Lives in: House/apartment Stairs: Yes: External: 3 steps; none Has following equipment at home: Single point cane  OCCUPATION: Not currently working.  PLOF: Independent, however has MS and needs assistance from husband to get dressed on the bad days.  PATIENT GOALS: To get rid of the pain that's in the L side of the neck.  NEXT MD VISIT: a month later, sometime in August.  OBJECTIVE:  Note: Objective measures were completed at Evaluation unless otherwise noted.  DIAGNOSTIC FINDINGS:   EXAM: CERVICAL SPINE - COMPLETE 4+ VIEW  IMPRESSION: 1. Minimal degenerative joint changes of cervical spine. 2. Incidental finding of a 2.9 cm oval calcified soft tissue nodule in the right mid neck question calcified thyroid  nodule. Correlating to patient's known multinodular goiter.   PATIENT SURVEYS:  NDI:  NECK DISABILITY INDEX  Date: 06/04/24 Score  Pain intensity 2 = The pain is moderate at the moment  2. Personal care (washing, dressing, etc.) 0 = I can look after myself normally without causing extra pain  3. Lifting 0 =  I can lift heavy weights without extra pain  4. Reading 3 = I can't read as much as I want because of moderate pain in my neck  5. Headaches 1 =  I have slight headaches, which come infrequently  6. Concentration 3 = I have a lot of difficulty in concentrating when I want to  7. Work 3 =  I cannot do my usual work  8. Driving 3 = I can't drive my car as long as I want because of moderate pain in my neck  9. Sleeping 1 = My sleep is  slightly disturbed (less than 1 hr sleepless)  10. Recreation 3 = I am able to engage in a few of my usual recreation activities because of pain in  my neck  Total 19/50; 38%   Minimum Detectable Change (90% confidence): 5 points or 10% points  COGNITION: Overall cognitive status: Within functional limits for tasks assessed  SENSATION: WFL  POSTURE: rounded shoulders, forward head, and posterior pelvic tilt  PALPATION: Tightness noted in the L UT's much greater than the R    CERVICAL ROM:   Active ROM A/PROM (deg) eval AROM (deg) 06/15/24  Flexion 41 48  Extension 48 32  Right lateral flexion 30 32  Left lateral flexion 26 36  Right rotation 56 60  Left rotation 46 50  (Blank rows = not tested)  UPPER EXTREMITY ROM:  Active ROM Right eval Left eval  Shoulder flexion 157 137  Shoulder abduction 168 167   (Blank rows = not tested)  UPPER EXTREMITY MMT:  MMT Right eval Left eval  Shoulder flexion 4 4-  Shoulder extension 4 4-   (Blank rows = not tested)    TREATMENT DATE: 06/25/24  Manual:  Supine STM to cervical region to increase extensibility of the paraspinals Supine suboccipital release technique to decrease cervicalgia Supine manual traction performed in order to increase joint space in cervical region for pain relief Supine cervical upglides/downglides, 30 sec bouts Supine UT/Levator stretch, 30 sec bouts to increase tissue extensibility of the cervical region  Supine SCM stretch applied by therapist and pt reports feeling the stretch in the area she normally has pain in.  Pt instructed on how to perform at home in supine position as this seems easier for her to do.  Supine STM with TP release to the masseter and risorius region for pain modulation  TMJ was assessed as well to see if pain was originating from this location, however found to not be contributing to current pain levels.   Self-Care Home Management:  Pt educated on current  prognosis and provided education regarding differential diagnosis and what may be contributing to the recurrent pain.  Pt given visual information regarding trigger point referral patterns and possibility as the origin of pain.  Therapist utilized verbal and visual cues for education and also provided pt with technique and location of where to perform self mobilization at home.        PATIENT EDUCATION:  Education details: See above section- self care for specifics. Person educated: Patient Education method: Explanation Education comprehension: verbalized understanding  HOME EXERCISE PROGRAM:  Access Code: [email or web address] URL: [email or web address] Date: 06/11/2024 Prepared by: Reyes London  Exercises - Seated Upper Trap Stretch  - 1-2 x daily - 3 sets - 30 sec hold - Gentle Levator Scapulae Stretch  - 1-2 x daily - 3 sets - 30 sec hold - Seated Assisted Cervical Rotation with Towel  - 1-2 x daily - 3 sets - 30 hold  ASSESSMENT:  CLINICAL IMPRESSION:  Pt continues to benefit from therapy, although pt reports that overall pain levels still creep up following therapy sessions.  Pt notes that she reports therapy has helped some, but has not been as beneficial as she would have hoped and is still looking at potentially getting following up with MD sooner rather than later if things do not progress anymore.  Pt still pleased with the lack of referral pain in the neck that has been alleviated in the neck, and is on board to stay within the current POC for the next week and we will evaluate again.        OBJECTIVE IMPAIRMENTS: decreased activity tolerance, decreased ROM, decreased strength, hypomobility, and pain.  ACTIVITY LIMITATIONS: carrying and lifting  PARTICIPATION LIMITATIONS: driving  PERSONAL FACTORS: Fitness, Past/current experiences, and 3+ comorbidities: anxiety, lupus, thyroid  disease, sleep apnea are also affecting patient's functional outcome.   REHAB  POTENTIAL: Good  CLINICAL DECISION MAKING: Evolving/moderate complexity  EVALUATION COMPLEXITY: Low   GOALS: Goals reviewed with patient? Yes  SHORT TERM GOALS: Target date: 07/02/2024  Pt will be independent with HEP in order to demonstrate increased ability to perform tasks related to occupation/hobbies. Baseline: to be given at subsequent visit. Goal status: INITIAL   LONG TERM GOALS: Target date: 08/27/2024  1.  Pt will reduce their Neck Disability Index (NDI) score by at least 10 points through adherence to a physical therapy program to improve function and decrease neck-related limitations in daily activities Baseline: 19/50; 38% Goal status: INITIAL  2. Pt will increase active cervical left rotation to at least 60, through a targeted cervical mobility program in order to support improved driving visibility and daily head-turning tasks. Baseline: 46 deg Goal status: INITIAL  3.  Pt will improve active left shoulder flexion to at least 160, using therapeutic exercise focused on shoulder mobility, scapular stabilization, and rotator cuff activation, to enhance ability to reach overhead during ADLs. Baseline: 137 deg Goal status: INITIAL  4. Pt will increase active cervical flexion to at least 55, to support pain-free reading, looking down at objects, and performing daily tasks, through consistent stretching, strengthening, and manual therapy." Baseline: 41 deg Goal status:  INITIAL   PLAN:  PT FREQUENCY: 1-2x/week  PT DURATION: 12 weeks  PLANNED INTERVENTIONS: 97750- Physical Performance Testing, 97110-Therapeutic exercises, 97530- Therapeutic activity, 97112- Neuromuscular re-education, 97535- Self Care, 02859- Manual therapy, [id number]- Canalith repositioning, 97012- Traction (mechanical), Joint mobilization, Joint manipulation, Spinal manipulation, Spinal mobilization, Vestibular training, Cryotherapy, and Moist heat  PLAN FOR NEXT SESSION:   manual therapy for pain  relief and cervical ROM Review and progress  HEP   Fonda Simpers, PT, DPT Physical Therapist - [facility]  [facility]  06/25/24, 6:23 PM

## 2024-06-30 ENCOUNTER — Ambulatory Visit (INDEPENDENT_AMBULATORY_CARE_PROVIDER_SITE_OTHER): Admitting: Family Medicine

## 2024-06-30 ENCOUNTER — Ambulatory Visit

## 2024-06-30 ENCOUNTER — Encounter: Payer: Self-pay | Admitting: Family Medicine

## 2024-06-30 VITALS — BP 122/61 | HR 96 | Resp 16 | Ht 65.0 in | Wt 272.0 lb

## 2024-06-30 DIAGNOSIS — R1319 Other dysphagia: Secondary | ICD-10-CM

## 2024-06-30 DIAGNOSIS — M542 Cervicalgia: Secondary | ICD-10-CM

## 2024-06-30 DIAGNOSIS — S0340XD Sprain of jaw, unspecified side, subsequent encounter: Secondary | ICD-10-CM

## 2024-06-30 DIAGNOSIS — M48061 Spinal stenosis, lumbar region without neurogenic claudication: Secondary | ICD-10-CM

## 2024-06-30 DIAGNOSIS — M25612 Stiffness of left shoulder, not elsewhere classified: Secondary | ICD-10-CM

## 2024-06-30 DIAGNOSIS — I10 Essential (primary) hypertension: Secondary | ICD-10-CM

## 2024-06-30 DIAGNOSIS — E042 Nontoxic multinodular goiter: Secondary | ICD-10-CM

## 2024-06-30 DIAGNOSIS — F419 Anxiety disorder, unspecified: Secondary | ICD-10-CM

## 2024-06-30 DIAGNOSIS — M150 Primary generalized (osteo)arthritis: Secondary | ICD-10-CM | POA: Diagnosis not present

## 2024-06-30 DIAGNOSIS — R29898 Other symptoms and signs involving the musculoskeletal system: Secondary | ICD-10-CM | POA: Diagnosis not present

## 2024-06-30 DIAGNOSIS — Z419 Encounter for procedure for purposes other than remedying health state, unspecified: Secondary | ICD-10-CM | POA: Diagnosis not present

## 2024-06-30 MED ORDER — HYDROCODONE-ACETAMINOPHEN 5-325 MG PO TABS: 1.0000 | ORAL_TABLET | Freq: Four times a day (QID) | ORAL | 0 refills | Status: DC | PRN

## 2024-06-30 MED ORDER — ALPRAZOLAM 0.25 MG PO TABS: 0.2500 mg | ORAL_TABLET | Freq: Two times a day (BID) | ORAL | 0 refills | Status: DC | PRN

## 2024-06-30 NOTE — Progress Notes (Signed)
 Established patient visit   Patient: Ruth Woods   DOB: 16-Feb-1966   58 y.o. Female  MRN: [id number] Visit Date: 06/30/2024  Today's healthcare provider: Rockie Agent, MD   Chief Complaint  Patient presents with   Follow-up    F/u on Breathing and swallowing   Subjective     HPI     Follow-up    Additional comments: F/u on Breathing and swallowing      Last edited by Marylen Odella CROME, CMA on 06/30/2024  8:10 AM.       Discussed the use of AI scribe software for clinical note transcription with the patient, who gave verbal consent to proceed.  History of Present Illness Ruth Woods is a 58 year old female who presents for follow-up of intermittent dysphagia and chronic neck pain.  She experiences intermittent dysphagia, describing it as a sensation of food feeling 'stuck' in the lower part of her neck, just under her goiter. There is a sensation of tightness, likened to having 'fuzz in her throat'. No recent episodes of gasping for air. Previous barium swallow studies have shown no esophageal abnormalities. She is under endocrinology care for her goiter, which is enlarged but not cancerous.  She reports chronic left-sided neck pain radiating to her left jaw and down the left side of her neck. This pain persists despite a TPI injection in March, which improved her headaches but not the neck pain. She has a history of cervical spondylosis. Physical therapy has been initiated, and she has completed five out of ten approved sessions. The pain is described as 'TMJish' with temporary relief following manual adjustments by her physical therapist.  She is currently taking hydrocodone -acetaminophen  5-325 mg, one to two tablets every six hours as needed for chronic pain, Atarax  12.5-25 mg daily as needed, and meloxicam  15 mg daily for inflammation. The hydrocodone  helps with her lower back pain and osteoarthritis but not with her neck  pain.  She has a history of multiple sclerosis with head tremors and continues to follow up with neurology. She is not on any blood pressure medication but is participating in a program called [employer] to manage her diet and blood pressure, which has shown some improvement in her readings.  At end of visit, patient mentions increasing levels of anxiety, reports this initially became an issue surrounding the death of her father and guilt about what she could have done to help him, she also notes some family dynamic issues regarding her siblings, she would like to speak with a therapist/counselor as well as resume temporary dose of Xanax  to help with her symptoms as the 0.25mg  dose helped in the past    Past Medical History:  Diagnosis Date   Allergy 2006   Levaquin   Anxiety    Arthritis 2010   Carpal tunnel syndrome 10/08/2018   Collagen vascular disease (HCC)    GERD (gastroesophageal reflux disease)    Lupus    Polyarthralgia 05/28/2019   Sleep apnea    Thyroid  disease 2006    Medications: Outpatient Medications Prior to Visit  Medication Sig   hydrOXYzine  (ATARAX ) 25 MG tablet TAKE 0.5-1 TABLETS (12.5-25 MG TOTAL) BY MOUTH DAILY AS NEEDED.   meloxicam  (MOBIC ) 15 MG tablet Take 1 tablet (15 mg total) by mouth daily at 6 (six) AM.   [DISCONTINUED] HYDROcodone -acetaminophen  (NORCO/VICODIN) 5-325 MG tablet Take 1-2 tablets by mouth every 6 (six) hours as needed for moderate pain (pain score 4-6).  Take 1-2 times daily as needed   No facility-administered medications prior to visit.    Review of Systems  Last metabolic panel Lab Results  Component Value Date   GLUCOSE 114 (H) 02/04/2024   NA 141 02/04/2024   K 4.2 02/04/2024   CL 104 02/04/2024   CO2 23 02/04/2024   BUN 15 02/04/2024   CREATININE 0.65 02/04/2024   EGFR 102 02/04/2024   CALCIUM 9.3 02/04/2024   PROT 6.7 02/04/2024   ALBUMIN 4.2 02/04/2024   LABGLOB 2.5 02/04/2024   AGRATIO 1.4 02/20/2023   BILITOT  0.2 02/04/2024   ALKPHOS 105 02/04/2024   AST 16 02/04/2024   ALT 23 02/04/2024   ANIONGAP 10 04/02/2022   Last lipids Lab Results  Component Value Date   CHOL 190 06/01/2024   HDL 49 06/01/2024   LDLCALC 102 (H) 06/01/2024   TRIG 229 (H) 06/01/2024   CHOLHDL 3.9 06/01/2024   Last hemoglobin A1c Lab Results  Component Value Date   HGBA1C 6.2 (H) 06/01/2024   Last thyroid  functions Lab Results  Component Value Date   TSH 0.536 08/22/2023    .risr      Objective    BP 122/61 (BP Location: Left Arm, Patient Position: Sitting, Cuff Size: Large)   Pulse 96   Resp 16   Ht 5' 5 (1.651 m)   Wt 272 lb (123.4 kg)   SpO2 98%   BMI 45.26 kg/m  BP Readings from Last 3 Encounters:  06/30/24 122/61  06/03/24 132/84  06/01/24 132/88   Wt Readings from Last 3 Encounters:  06/30/24 272 lb (123.4 kg)  06/03/24 273 lb 8 oz (124.1 kg)  06/01/24 272 lb (123.4 kg)        Physical Exam Vitals reviewed.  Constitutional:      General: She is not in acute distress.    Appearance: Normal appearance. She is not ill-appearing.  Cardiovascular:     Rate and Rhythm: Normal rate and regular rhythm.  Pulmonary:     Effort: Pulmonary effort is normal. No respiratory distress.     Breath sounds: No wheezing, rhonchi or rales.  Neurological:     Mental Status: She is alert and oriented to person, place, and time.  Psychiatric:        Mood and Affect: Mood is anxious and depressed. Affect is tearful.        Behavior: Behavior normal.       No results found for any visits on 06/30/24.  Assessment & Plan     Problem List Items Addressed This Visit       Cardiovascular and Mediastinum   Primary hypertension - Primary     Endocrine   Multinodular goiter   Relevant Orders   Ambulatory referral to Endocrinology     Musculoskeletal and Integument   Primary osteoarthritis involving multiple joints   Relevant Medications   HYDROcodone -acetaminophen  (NORCO/VICODIN) 5-325 MG  tablet     Other   Spinal stenosis of lumbar region without neurogenic claudication   Relevant Medications   HYDROcodone -acetaminophen  (NORCO/VICODIN) 5-325 MG tablet   Neck pain on left side   Anxiety   Relevant Medications   ALPRAZolam  (XANAX ) 0.25 MG tablet   Other Relevant Orders   Ambulatory referral to Psychiatry   Other Visit Diagnoses       TMJ (sprain of temporomandibular joint), subsequent encounter         Esophageal dysphagia           Assessment &  Plan Intermittent dysphagia likely related to goiter Intermittent dysphagia with sensation of food getting stuck in the lower neck area, possibly due to enlarged goiter. Previous barium swallow tests showed no esophageal obstruction. Endocrinologist determined the goiter is not cancerous and does not require removal. She is considering a second opinion due to concerns about the goiter's impact on swallowing. - Refer to endocrinology for second opinion regarding goiter and its impact on swallowing  Chronic left-sided neck pain with cervical spondylosis Chronic left-sided neck pain radiating to the jaw and back of the neck, possibly aggravated by a previous injection. Cervical spondylosis present. Physical therapy provides some relief. Possible TMJ involvement suspected by physical therapist. TMJ massages and exercises provide temporary relief. - Continue physical therapy for cervical spine and TMJ - Consider C2 block after completion of physical therapy - Follow up with neurosurgery in August or September  Chronic lower back pain and osteoarthritis Chronic lower back pain managed with hydrocodone -acetaminophen  (Norco) and meloxicam . Pain not alleviated by current medication regimen for neck pain. - Refill hydrocodone -acetaminophen  (Norco) prescription  Hypertension Blood pressure readings variable, with recent home readings higher than today's clinic reading. Participating in a dietary program through Medicaid to manage  blood pressure and weight, which she finds beneficial. - Continue participation in dietary program through Medicaid - Monitor blood pressure regularly   Chronic Anxiety  Reports previously had anxiety associated with the loss of her father  Previously was on xanax  to help with symptoms  Tearful during exam today Reports feeling anxious related to neck pain and limitations physically due to dgx of MS  - resume Xanax  0.25mg  bid PRN   Return in about 4 months (around 10/30/2024) for Chronic Pain f/u,anxiety .         Rockie Agent, MD  [facility] [phone] (phone) [phone] (fax)  [facility]

## 2024-06-30 NOTE — Therapy (Signed)
 OUTPATIENT PHYSICAL THERAPY CERVICAL TREATMENT   Patient Name: Ruth Woods MRN: [id number] DOB:15-Jul-1966, 58 y.o., female Today's Date: 06/30/2024  END OF SESSION:  PT End of Session - 06/30/24 1534     Visit Number 5    Number of Visits 25    Date for PT Re-Evaluation 08/27/24    Progress Note Due on Visit 10    PT Start Time 1532    PT Stop Time 1615    PT Time Calculation (min) 43 min    Activity Tolerance Patient tolerated treatment well    Behavior During Therapy [facility] for tasks assessed/performed          Past Medical History:  Diagnosis Date   Allergy 2006   Levaquin   Anxiety    Arthritis 2010   Carpal tunnel syndrome 10/08/2018   Collagen vascular disease (HCC)    GERD (gastroesophageal reflux disease)    Lupus    Polyarthralgia 05/28/2019   Sleep apnea    Thyroid  disease 2006   Past Surgical History:  Procedure Laterality Date   ABDOMINAL HYSTERECTOMY     CESAREAN SECTION  1992   CHOLECYSTECTOMY     COSMETIC SURGERY  1987   Patient Active Problem List   Diagnosis Date Noted   Anxiety 06/30/2024   Neck pain on left side 03/26/2024   Chronic pansinusitis 07/31/2023   Eustachian tube dysfunction, bilateral 07/31/2023   Adjustment disorder with mixed anxiety and depressed mood 07/31/2023   Primary hypertension 07/31/2023   Annual physical exam 05/22/2023   Healthcare maintenance 05/22/2023   Morbid obesity (HCC) 05/22/2023   Night sweats 05/22/2023   Long term (current) use of non-steroidal anti-inflammatories (nsaid) 02/20/2023   Spinal stenosis of lumbar region without neurogenic claudication 02/20/2023   Multinodular goiter 02/20/2023   Insomnia secondary to chronic pain 02/20/2023   Multiple sclerosis (HCC) 04/17/2022   Central sleep apnea 04/17/2022   Other fatigue 04/17/2022   Word finding difficulty 04/17/2022   Optic neuritis 04/03/2022   Discoid lupus 2010    Primary osteoarthritis involving multiple joints 06/19/2019    Obesity, Class III, BMI 40-49.9 (morbid obesity) 05/28/2019   Vitamin D  deficiency 05/28/2019   Encounter for long-term (current) use of high-risk medication 05/28/2019   Bilateral dry eyes 05/28/2019    PCP: Sharma Coyer, MD   REFERRING PROVIDER: Hilma Hastings, PA-C   REFERRING DIAG: M54.2 (ICD-10-CM) - Neck pain   THERAPY DIAG:  Cervical pain  Decreased ROM of neck  Decreased ROM of left shoulder  Rationale for Evaluation and Treatment: Rehabilitation  ONSET DATE: 02/01/2024  SUBJECTIVE:  SUBJECTIVE STATEMENT:  Pt reports 2/10 pain upon arrival.  Pt notes she had 2 pretty bad days following the last session, but then it got better the yesterday.     From Eval: Pt reports she was having headaches every single day, so she sought to have a nerve block that was performed and she noted a reduction in her headaches, but immediately following the injection, she started having a dull ache in the L side of the neck.  Pt reports it feels as though its near the salivary gland and radiates towards the base of her skull.  She feels as though she has traded off the headache pain for this L sided neck pain.  Pt was told she could have another injection, however is somewhat concerned about doing this due to the previous outcome.    Hand dominance: Right  PERTINENT HISTORY:   Ms. 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   PAIN:  Are you having pain? Yes: NPRS scale: 4/10 Pain location: L sided neck Pain description: dull achey Aggravating factors: certain movements,  Relieving factors: massage therapy every other week with distraction  PRECAUTIONS: None  RED FLAGS: Cervical red flags: pt has difficulty  swallowing and breathing due to the enlarged goiter of the neck and Bowel or bladder incontinence: Yes: pt reports she has some back pain that causes her to not be able to tell when she urinates.    WEIGHT BEARING RESTRICTIONS: No  FALLS:  Has patient fallen in last 6 months? No  LIVING ENVIRONMENT: Lives with: lives with their spouse Lives in: House/apartment Stairs: Yes: External: 3 steps; none Has following equipment at home: Single point cane  OCCUPATION: Not currently working.  PLOF: Independent, however has MS and needs assistance from husband to get dressed on the bad days.  PATIENT GOALS: To get rid of the pain that's in the L side of the neck.  NEXT MD VISIT: a month later, sometime in August.  OBJECTIVE:  Note: Objective measures were completed at Evaluation unless otherwise noted.  DIAGNOSTIC FINDINGS:   EXAM: CERVICAL SPINE - COMPLETE 4+ VIEW  IMPRESSION: 1. Minimal degenerative joint changes of cervical spine. 2. Incidental finding of a 2.9 cm oval calcified soft tissue nodule in the right mid neck question calcified thyroid  nodule. Correlating to patient's known multinodular goiter.   PATIENT SURVEYS:  NDI:  NECK DISABILITY INDEX  Date: 06/04/24 Score  Pain intensity 2 = The pain is moderate at the moment  2. Personal care (washing, dressing, etc.) 0 = I can look after myself normally without causing extra pain  3. Lifting 0 =  I can lift heavy weights without extra pain  4. Reading 3 = I can't read as much as I want because of moderate pain in my neck  5. Headaches 1 =  I have slight headaches, which come infrequently  6. Concentration 3 = I have a lot of difficulty in concentrating when I want to  7. Work 3 =  I cannot do my usual work  8. Driving 3 = I can't drive my car as long as I want because of moderate pain in my neck  9. Sleeping 1 = My sleep is slightly disturbed (less than 1 hr sleepless)  10. Recreation 3 = I am able to engage in a few of  my usual recreation activities because of pain in   my neck  Total 19/50; 38%   Minimum  Detectable Change (90% confidence): 5 points or 10% points  COGNITION: Overall cognitive status: Within functional limits for tasks assessed  SENSATION: WFL  POSTURE: rounded shoulders, forward head, and posterior pelvic tilt  PALPATION: Tightness noted in the L UT's much greater than the R    CERVICAL ROM:   Active ROM A/PROM (deg) eval AROM (deg) 06/15/24  Flexion 41 48  Extension 48 32  Right lateral flexion 30 32  Left lateral flexion 26 36  Right rotation 56 60  Left rotation 46 50  (Blank rows = not tested)  UPPER EXTREMITY ROM:  Active ROM Right eval Left eval  Shoulder flexion 157 137  Shoulder abduction 168 167   (Blank rows = not tested)  UPPER EXTREMITY MMT:  MMT Right eval Left eval  Shoulder flexion 4 4-  Shoulder extension 4 4-   (Blank rows = not tested)    TREATMENT DATE: 06/30/24  Manual:  Supine STM to cervical region to increase extensibility of the paraspinals Supine suboccipital release technique to decrease cervicalgia Supine manual traction performed in order to increase joint space in cervical region for pain relief  Supine UT/Levator stretch, 30 sec bouts to increase tissue extensibility of the cervical region  Supine SCM stretch applied by therapist, 30 sec bouts each side, with pt noting the stretch to be more present on the L side  Trigger Point Dry Needling  What is Trigger Point Dry Needling (DN)? DN is a physical therapy technique used to treat muscle pain and dysfunction. Specifically, DN helps deactivate muscle trigger points (muscle knots).  A thin filiform needle is used to penetrate the skin and stimulate the underlying trigger point. The goal is for a local twitch response (LTR) to occur and for the trigger point to relax. No medication of any kind is injected during the procedure.  Benefits of Trigger Point Dry  Needling Reduces localized tension and stiffness promoting muscle function. Promotes range of motion and flexibility of the area being treated. Relieves localized and referred muscle related pain.  Promotes localized blood flow. Benefits of DN increase when performed with formal therapy including strengthening and stretching.   What Does Trigger Point Dry Needling Feel Like?  The procedure feels different for each individual patient. Some patients report that they do not actually feel the needle enter the skin and overall the process is not painful. Very mild bleeding may occur. However, many patients feel a deep cramping in the muscle in which the needle was inserted. This is the local twitch response.   How Will I feel after the treatment? Soreness is normal, and the onset of soreness may not occur for a few hours. Typically this soreness does not last longer than two days.  Bruising is uncommon, however; ice can be used to decrease any possible bruising.  In rare cases feeling tired or nauseous after the treatment is normal. In addition, your symptoms may get worse before they get better, this period will typically not last longer than 24 hours.   What Can I do After My Treatment? Increase your hydration by drinking more water for the next 24 hours.  You may place ice or heat on the areas treated that have become sore, however, do not use heat on inflamed or bruised areas. Heat often brings more relief post needling. You can continue your regular activities, but vigorous activity is not recommended initially after the treatment for 24 hours. DN is best combined with other physical therapy such as strengthening, stretching,  and other therapies.   What are the complications? While your therapist has had extensive training in minimizing the risks of trigger point dry needling, it is important to understand the risks of any procedure.  Risks include bleeding, pain, fatigue, hematoma, infection,  vertigo, nausea or nerve involvement. Monitor for any changes to your skin or sensation. Contact your therapist or MD with concerns.  A rare but serious complication is a pneumothorax over or near your middle and upper chest and back If you have dry needling in this area, monitor for the following symptoms: Shortness of breath on exertion and/or Difficulty taking a deep breath and/or Chest Pain and/or A dry cough If any of the above symptoms develop, please go to the nearest emergency room or call 911. Tell them you had dry needling over your thorax and report any symptoms you are having. Please follow-up with your treating therapist after you complete the medical evaluation.   Trigger Point Dry Needling  Initial Treatment: Pt instructed on Dry Needling rational, procedures, and possible side effects. Pt instructed to expect mild to moderate muscle soreness later in the day and/or into the next day.  Pt instructed in methods to reduce muscle soreness. Pt instructed to continue prescribed HEP. Patient was educated on signs and symptoms of infection and other risk factors and advised to seek medical attention should they occur.  Patient verbalized understanding of these instructions and education.   Patient Verbal Consent Given: Yes Education Handout Provided: Yes Muscles Treated: Masseter and Pterygoid  Electrical Stimulation Performed: No Treatment Response/Outcome: Pt with a reduction in overall symptoms at the conclusion of the session and noted reduced tenderness with palpation following the session as well.    Needling performed by Marina  Leopoldo, PT, DPT    PATIENT EDUCATION:  Education details: See above section- self care for specifics. Person educated: Patient Education method: Explanation Education comprehension: verbalized understanding  HOME EXERCISE PROGRAM:  Access Code: [email or web address] URL: [email or web address] Date: 06/11/2024 Prepared by: Reyes London  Exercises - Seated Upper Trap Stretch  - 1-2 x daily - 3 sets - 30 sec hold - Gentle Levator Scapulae Stretch  - 1-2 x daily - 3 sets - 30 sec hold - Seated Assisted Cervical Rotation with Towel  - 1-2 x daily - 3 sets - 30 hold  ASSESSMENT:  CLINICAL IMPRESSION:  Pt responded well to the manual therapy approach coupled with the dry needling.  Pt noted to have a significant reduction  in overall tension in the masseter and pterygoid of the L side.  Pt somewhat still tender at the conclusion of the session, but much less than arrival.  Pt encouraged to drink plenty of water over the next 24 hours to reduce soreness.  Pt agreeable and advised to continue to perform exercises starting tomorrow.   Pt will continue to benefit from skilled therapy to address remaining deficits in order to improve overall QoL and return to PLOF.         OBJECTIVE IMPAIRMENTS: decreased activity tolerance, decreased ROM, decreased strength, hypomobility, and pain.   ACTIVITY LIMITATIONS: carrying and lifting  PARTICIPATION LIMITATIONS: driving  PERSONAL FACTORS: Fitness, Past/current experiences, and 3+ comorbidities: anxiety, lupus, thyroid  disease, sleep apnea are also affecting patient's functional outcome.   REHAB POTENTIAL: Good  CLINICAL DECISION MAKING: Evolving/moderate complexity  EVALUATION COMPLEXITY: Low   GOALS: Goals reviewed with patient? Yes  SHORT TERM GOALS: Target date: 07/02/2024  Pt will be independent with HEP in order to demonstrate increased ability  to perform tasks related to occupation/hobbies. Baseline: to be given at subsequent visit. Goal status: INITIAL   LONG TERM GOALS: Target date: 08/27/2024  1.  Pt will reduce their Neck Disability Index (NDI) score by at least 10 points through adherence to a physical therapy program to improve function and decrease neck-related limitations in daily activities Baseline: 19/50; 38% Goal status: INITIAL  2. Pt will  increase active cervical left rotation to at least 60, through a targeted cervical mobility program in order to support improved driving visibility and daily head-turning tasks. Baseline: 46 deg Goal status: INITIAL  3.  Pt will improve active left shoulder flexion to at least 160, using therapeutic exercise focused on shoulder mobility, scapular stabilization, and rotator cuff activation, to enhance ability to reach overhead during ADLs. Baseline: 137 deg Goal status: INITIAL  4. Pt will increase active cervical flexion to at least 55, to support pain-free reading, looking down at objects, and performing daily tasks, through consistent stretching, strengthening, and manual therapy." Baseline: 41 deg Goal status:  INITIAL   PLAN:  PT FREQUENCY: 1-2x/week  PT DURATION: 12 weeks  PLANNED INTERVENTIONS: 97750- Physical Performance Testing, 97110-Therapeutic exercises, 97530- Therapeutic activity, 97112- Neuromuscular re-education, 97535- Self Care, [id number]- Manual therapy, [phone]- Canalith repositioning, [id number]- Traction (mechanical), Joint mobilization, Joint manipulation, Spinal manipulation, Spinal mobilization, Vestibular training, Cryotherapy, and Moist heat  PLAN FOR NEXT SESSION:   manual therapy for pain relief and cervical ROM Review response to dry needling   Fonda Simpers, PT, DPT Physical Therapist - [facility]  [facility]  06/30/24, 4:13 PM

## 2024-07-07 ENCOUNTER — Ambulatory Visit

## 2024-07-07 DIAGNOSIS — M542 Cervicalgia: Secondary | ICD-10-CM | POA: Diagnosis not present

## 2024-07-07 DIAGNOSIS — R29898 Other symptoms and signs involving the musculoskeletal system: Secondary | ICD-10-CM

## 2024-07-07 DIAGNOSIS — M25612 Stiffness of left shoulder, not elsewhere classified: Secondary | ICD-10-CM | POA: Diagnosis not present

## 2024-07-07 NOTE — Therapy (Signed)
 OUTPATIENT PHYSICAL THERAPY CERVICAL TREATMENT   Patient Name: Ruth Woods MRN: [id number] DOB:13-Jul-1966, 58 y.o., female Today's Date: 07/07/2024  END OF SESSION:  PT End of Session - 07/07/24 1537     Visit Number 6    Number of Visits 25    Date for PT Re-Evaluation 08/27/24    Progress Note Due on Visit 10    PT Start Time 1531    PT Stop Time 1615    PT Time Calculation (min) 44 min    Activity Tolerance Patient tolerated treatment well    Behavior During Therapy [facility] for tasks assessed/performed          Past Medical History:  Diagnosis Date   Allergy 2006   Levaquin   Anxiety    Arthritis 2010   Carpal tunnel syndrome 10/08/2018   Collagen vascular disease (HCC)    GERD (gastroesophageal reflux disease)    Lupus    Polyarthralgia 05/28/2019   Sleep apnea    Thyroid  disease 2006   Past Surgical History:  Procedure Laterality Date   ABDOMINAL HYSTERECTOMY     CESAREAN SECTION  1992   CHOLECYSTECTOMY     COSMETIC SURGERY  1987   Patient Active Problem List   Diagnosis Date Noted   Anxiety 06/30/2024   Neck pain on left side 03/26/2024   Chronic pansinusitis 07/31/2023   Eustachian tube dysfunction, bilateral 07/31/2023   Adjustment disorder with mixed anxiety and depressed mood 07/31/2023   Primary hypertension 07/31/2023   Annual physical exam 05/22/2023   Healthcare maintenance 05/22/2023   Morbid obesity (HCC) 05/22/2023   Night sweats 05/22/2023   Long term (current) use of non-steroidal anti-inflammatories (nsaid) 02/20/2023   Spinal stenosis of lumbar region without neurogenic claudication 02/20/2023   Multinodular goiter 02/20/2023   Insomnia secondary to chronic pain 02/20/2023   Multiple sclerosis (HCC) 04/17/2022   Central sleep apnea 04/17/2022   Other fatigue 04/17/2022   Word finding difficulty 04/17/2022   Optic neuritis 04/03/2022   Discoid lupus 2010    Primary osteoarthritis involving multiple joints 06/19/2019    Obesity, Class III, BMI 40-49.9 (morbid obesity) 05/28/2019   Vitamin D  deficiency 05/28/2019   Encounter for long-term (current) use of high-risk medication 05/28/2019   Bilateral dry eyes 05/28/2019    PCP: Sharma Coyer, MD   REFERRING PROVIDER: Hilma Hastings, PA-C   REFERRING DIAG: M54.2 (ICD-10-CM) - Neck pain   THERAPY DIAG:  Cervical pain  Decreased ROM of neck  Decreased ROM of left shoulder  Rationale for Evaluation and Treatment: Rehabilitation  ONSET DATE: 02/01/2024  SUBJECTIVE:  SUBJECTIVE STATEMENT:  Pt reports 3/10 pain in the L posterior neck upon arrival today.  Pt notes that she felt a tremendous amount of relief with the dry needling following the last session.  Pt is really happy with how it progressed and how she was able to open her mouth more and talk clearer following the treatment session.   From Eval: Pt reports she was having headaches every single day, so she sought to have a nerve block that was performed and she noted a reduction in her headaches, but immediately following the injection, she started having a dull ache in the L side of the neck.  Pt reports it feels as though its near the salivary gland and radiates towards the base of her skull.  She feels as though she has traded off the headache pain for this L sided neck pain.  Pt was told she could have another injection, however is somewhat concerned about doing this due to the previous outcome.    Hand dominance: Right  PERTINENT HISTORY:   Ruth Woods has a history of discoid lupus, OSA, thyroid  disease, MS, chronic headaches, HTN, obesity.   PAIN:  Are you having pain? Yes: NPRS scale: 4/10 Pain location: L sided neck Pain description: dull achey Aggravating factors:  certain movements,  Relieving factors: massage therapy every other week with distraction  PRECAUTIONS: None  RED FLAGS: Cervical red flags: pt has difficulty swallowing and breathing due to the enlarged goiter of the neck and Bowel or bladder incontinence: Yes: pt reports she has some back pain that causes her to not be able to tell when she urinates.    WEIGHT BEARING RESTRICTIONS: No  FALLS:  Has patient fallen in last 6 months? No  LIVING ENVIRONMENT: Lives with: lives with their spouse Lives in: House/apartment Stairs: Yes: External: 3 steps; none Has following equipment at home: Single point cane  OCCUPATION: Not currently working.  PLOF: Independent, however has MS and needs assistance from husband to get dressed on the bad days.  PATIENT GOALS: To get rid of the pain that's in the L side of the neck.  NEXT MD VISIT: a month later, sometime in August.  OBJECTIVE:  Note: Objective measures were completed at Evaluation unless otherwise noted.  DIAGNOSTIC FINDINGS:   EXAM: CERVICAL SPINE - COMPLETE 4+ VIEW  IMPRESSION: 1. Minimal degenerative joint changes of cervical spine. 2. Incidental finding of a 2.9 cm oval calcified soft tissue nodule in the right mid neck question calcified thyroid  nodule. Correlating to patient's known multinodular goiter.   PATIENT SURVEYS:  NDI:  NECK DISABILITY INDEX  Date: 06/04/24 Score  Pain intensity 2 = The pain is moderate at the moment  2. Personal care (washing, dressing, etc.) 0 = I can look after myself normally without causing extra pain  3. Lifting 0 =  I can lift heavy weights without extra pain  4. Reading 3 = I can't read as much as I want because of moderate pain in my neck  5. Headaches 1 =  I have slight headaches, which come infrequently  6. Concentration 3 = I have a lot of difficulty in concentrating when I want to  7. Work 3 =  I cannot do my usual work  8. Driving 3 = I can't drive my car as long as I want  because of moderate pain in my neck  9. Sleeping 1 = My sleep is slightly disturbed (less than 1 hr sleepless)  10. Recreation 3 =  I am able to engage in a few of my usual recreation activities because of pain in   my neck  Total 19/50; 38%   Minimum Detectable Change (90% confidence): 5 points or 10% points  COGNITION: Overall cognitive status: Within functional limits for tasks assessed  SENSATION: WFL  POSTURE: rounded shoulders, forward head, and posterior pelvic tilt  PALPATION: Tightness noted in the L UT's much greater than the R    CERVICAL ROM:   Active ROM A/PROM (deg) eval AROM (deg) 06/15/24  Flexion 41 48  Extension 48 32  Right lateral flexion 30 32  Left lateral flexion 26 36  Right rotation 56 60  Left rotation 46 50  (Blank rows = not tested)  UPPER EXTREMITY ROM:  Active ROM Right eval Left eval  Shoulder flexion 157 137  Shoulder abduction 168 167   (Blank rows = not tested)  UPPER EXTREMITY MMT:  MMT Right eval Left eval  Shoulder flexion 4 4-  Shoulder extension 4 4-   (Blank rows = not tested)    TREATMENT DATE: 07/07/24   Manual:  Supine STM to cervical region to increase extensibility of the paraspinals Supine suboccipital release technique to decrease cervicalgia Supine manual traction performed in order to increase joint space in cervical region for pain relief  Supine UT/Levator stretch, 30 sec bouts to increase tissue extensibility of the cervical region   Trigger Point Dry Needling  Initial Treatment: Pt instructed on Dry Needling rational, procedures, and possible side effects. Pt instructed to expect mild to moderate muscle soreness later in the day and/or into the next day.  Pt instructed in methods to reduce muscle soreness. Pt instructed to continue prescribed HEP. Patient was educated on signs and symptoms of infection and other risk factors and advised to seek medical attention should they occur.  Patient  verbalized understanding of these instructions and education.   Patient Verbal Consent Given: Yes Education Handout Provided: Yes Muscles Treated: B UT's, B suboccipitals, B [place], and B Pterygoid  Electrical Stimulation Performed: No Treatment Response/Outcome: Pt with a reduction in overall symptoms at the conclusion of the session and noted reduced tenderness with palpation following the session as well.    Needling performed in combination of author & Marina  Leopoldo, PT, DPT    PATIENT EDUCATION:  Education details: See above section- self care for specifics. Person educated: Patient Education method: Explanation Education comprehension: verbalized understanding  HOME EXERCISE PROGRAM:  Access Code: [email or web address] URL: [email or web address] Date: 06/11/2024 Prepared by: Reyes London  Exercises - Seated Upper Trap Stretch  - 1-2 x daily - 3 sets - 30 sec hold - Gentle Levator Scapulae Stretch  - 1-2 x daily - 3 sets - 30 sec hold - Seated Assisted Cervical Rotation with Towel  - 1-2 x daily - 3 sets - 30 hold  ASSESSMENT:  CLINICAL IMPRESSION:  Pt continues to respond well with the manual therapy approach coupled with dry needling.  Pt ultimately had a significant amount of reduction in the overall tautness of the musculature that was worked with during the session.  Pt overall is making steady improvements and will continue to be monitored over the next few sessions.  Pt encouraged to continue with her HEP and will continue to monitor symptoms over the next few days.  Pt reminded of the dry needling precautions and actions to take if symptoms for pneumothorax is present.  Pt agreeable to this and voiced understanding.   Pt will continue to benefit from  skilled therapy to address remaining deficits in order to improve overall QoL and return to PLOF.         OBJECTIVE IMPAIRMENTS: decreased activity tolerance, decreased ROM, decreased strength, hypomobility,  and pain.   ACTIVITY LIMITATIONS: carrying and lifting  PARTICIPATION LIMITATIONS: driving  PERSONAL FACTORS: Fitness, Past/current experiences, and 3+ comorbidities: anxiety, lupus, thyroid  disease, sleep apnea are also affecting patient's functional outcome.   REHAB POTENTIAL: Good  CLINICAL DECISION MAKING: Evolving/moderate complexity  EVALUATION COMPLEXITY: Low   GOALS: Goals reviewed with patient? Yes  SHORT TERM GOALS: Target date: 07/02/2024  Pt will be independent with HEP in order to demonstrate increased ability to perform tasks related to occupation/hobbies. Baseline: to be given at subsequent visit. Goal status: INITIAL   LONG TERM GOALS: Target date: 08/27/2024  1.  Pt will reduce their Neck Disability Index (NDI) score by at least 10 points through adherence to a physical therapy program to improve function and decrease neck-related limitations in daily activities Baseline: 19/50; 38% Goal status: INITIAL  2. Pt will increase active cervical left rotation to at least 60, through a targeted cervical mobility program in order to support improved driving visibility and daily head-turning tasks. Baseline: 46 deg Goal status: INITIAL  3.  Pt will improve active left shoulder flexion to at least 160, using therapeutic exercise focused on shoulder mobility, scapular stabilization, and rotator cuff activation, to enhance ability to reach overhead during ADLs. Baseline: 137 deg Goal status: INITIAL  4. Pt will increase active cervical flexion to at least 55, to support pain-free reading, looking down at objects, and performing daily tasks, through consistent stretching, strengthening, and manual therapy." Baseline: 41 deg Goal status:  INITIAL   PLAN:  PT FREQUENCY: 1-2x/week  PT DURATION: 12 weeks  PLANNED INTERVENTIONS: 97750- Physical Performance Testing, 97110-Therapeutic exercises, 97530- Therapeutic activity, 97112- Neuromuscular re-education, 97535-  Self Care, 02859- Manual therapy, [phone]- Canalith repositioning, 97012- Traction (mechanical), Joint mobilization, Joint manipulation, Spinal manipulation, Spinal mobilization, Vestibular training, Cryotherapy, and Moist heat  PLAN FOR NEXT SESSION:   manual therapy for pain relief and cervical ROM Review response to dry needling   Fonda Simpers, PT, DPT Physical Therapist - [facility]  [facility]  07/07/24, 5:36 PM

## 2024-07-14 ENCOUNTER — Ambulatory Visit

## 2024-07-22 ENCOUNTER — Ambulatory Visit: Attending: Orthopedic Surgery

## 2024-07-22 ENCOUNTER — Encounter: Payer: Self-pay | Admitting: Orthopedic Surgery

## 2024-07-22 DIAGNOSIS — M25612 Stiffness of left shoulder, not elsewhere classified: Secondary | ICD-10-CM | POA: Diagnosis not present

## 2024-07-22 DIAGNOSIS — R29898 Other symptoms and signs involving the musculoskeletal system: Secondary | ICD-10-CM | POA: Insufficient documentation

## 2024-07-22 DIAGNOSIS — M542 Cervicalgia: Secondary | ICD-10-CM | POA: Diagnosis not present

## 2024-07-22 NOTE — Therapy (Signed)
 OUTPATIENT PHYSICAL THERAPY CERVICAL TREATMENT   Patient Name: Maci Eickholt MRN: [id number] DOB:03-24-1966, 58 y.o., female Today's Date: 07/22/2024  END OF SESSION:  PT End of Session - 07/22/24 1533     Visit Number 7    Number of Visits 25    Date for PT Re-Evaluation 08/27/24    Progress Note Due on Visit 10    PT Start Time 1534    PT Stop Time 1615    PT Time Calculation (min) 41 min    Activity Tolerance Patient tolerated treatment well    Behavior During Therapy [facility] for tasks assessed/performed          Past Medical History:  Diagnosis Date   Allergy 2006   Levaquin   Anxiety    Arthritis 2010   Carpal tunnel syndrome 10/08/2018   Collagen vascular disease (HCC)    GERD (gastroesophageal reflux disease)    Lupus    Polyarthralgia 05/28/2019   Sleep apnea    Thyroid  disease 2006   Past Surgical History:  Procedure Laterality Date   ABDOMINAL HYSTERECTOMY     CESAREAN SECTION  1992   CHOLECYSTECTOMY     COSMETIC SURGERY  1987   Patient Active Problem List   Diagnosis Date Noted   Anxiety 06/30/2024   Neck pain on left side 03/26/2024   Chronic pansinusitis 07/31/2023   Eustachian tube dysfunction, bilateral 07/31/2023   Adjustment disorder with mixed anxiety and depressed mood 07/31/2023   Primary hypertension 07/31/2023   Annual physical exam 05/22/2023   Healthcare maintenance 05/22/2023   Morbid obesity (HCC) 05/22/2023   Night sweats 05/22/2023   Long term (current) use of non-steroidal anti-inflammatories (nsaid) 02/20/2023   Spinal stenosis of lumbar region without neurogenic claudication 02/20/2023   Multinodular goiter 02/20/2023   Insomnia secondary to chronic pain 02/20/2023   Multiple sclerosis (HCC) 04/17/2022   Central sleep apnea 04/17/2022   Other fatigue 04/17/2022   Word finding difficulty 04/17/2022   Optic neuritis 04/03/2022   Discoid lupus 2010    Primary osteoarthritis involving multiple joints 06/19/2019    Obesity, Class III, BMI 40-49.9 (morbid obesity) 05/28/2019   Vitamin D  deficiency 05/28/2019   Encounter for long-term (current) use of high-risk medication 05/28/2019   Bilateral dry eyes 05/28/2019    PCP: Sharma Coyer, MD   REFERRING PROVIDER: Hilma Hastings, PA-C   REFERRING DIAG: M54.2 (ICD-10-CM) - Neck pain   THERAPY DIAG:  Cervical pain  Decreased ROM of neck  Decreased ROM of left shoulder  Rationale for Evaluation and Treatment: Rehabilitation  ONSET DATE: 02/01/2024  SUBJECTIVE:  SUBJECTIVE STATEMENT:  Pt reports that she is not doing as well as she hoped from the last time.  Pt notes that she is having increased neck pain and she reports it radiating down into the L side of her chest.  Pt notes that it started the day after getting her massage, but not sure if that's the reasoning.  Pt also reports that her husband was massaging the neck and she heard a couple of pops that actually loosened the R side, but the L side is still bothering her.     From Eval: Pt reports she was having headaches every single day, so she sought to have a nerve block that was performed and she noted a reduction in her headaches, but immediately following the injection, she started having a dull ache in the L side of the neck.  Pt reports it feels as though its near the salivary gland and radiates towards the base of her skull.  She feels as though she has traded off the headache pain for this L sided neck pain.  Pt was told she could have another injection, however is somewhat concerned about doing this due to the previous outcome.    Hand dominance: Right  PERTINENT HISTORY:   Ms. Yulieth Carrender has a history of discoid lupus, OSA, thyroid  disease, MS, chronic headaches, HTN,  obesity.   PAIN:  Are you having pain? Yes: NPRS scale: 4/10 Pain location: L sided neck Pain description: dull achey Aggravating factors: certain movements,  Relieving factors: massage therapy every other week with distraction  PRECAUTIONS: None  RED FLAGS: Cervical red flags: pt has difficulty swallowing and breathing due to the enlarged goiter of the neck and Bowel or bladder incontinence: Yes: pt reports she has some back pain that causes her to not be able to tell when she urinates.    WEIGHT BEARING RESTRICTIONS: No  FALLS:  Has patient fallen in last 6 months? No  LIVING ENVIRONMENT: Lives with: lives with their spouse Lives in: House/apartment Stairs: Yes: External: 3 steps; none Has following equipment at home: Single point cane  OCCUPATION: Not currently working.  PLOF: Independent, however has MS and needs assistance from husband to get dressed on the bad days.  PATIENT GOALS: To get rid of the pain that's in the L side of the neck.  NEXT MD VISIT: a month later, sometime in August.  OBJECTIVE:  Note: Objective measures were completed at Evaluation unless otherwise noted.  DIAGNOSTIC FINDINGS:   EXAM: CERVICAL SPINE - COMPLETE 4+ VIEW  IMPRESSION: 1. Minimal degenerative joint changes of cervical spine. 2. Incidental finding of a 2.9 cm oval calcified soft tissue nodule in the right mid neck question calcified thyroid  nodule. Correlating to patient's known multinodular goiter.   PATIENT SURVEYS:  NDI:  NECK DISABILITY INDEX  Date: 06/04/24 Score  Pain intensity 2 = The pain is moderate at the moment  2. Personal care (washing, dressing, etc.) 0 = I can look after myself normally without causing extra pain  3. Lifting 0 =  I can lift heavy weights without extra pain  4. Reading 3 = I can't read as much as I want because of moderate pain in my neck  5. Headaches 1 =  I have slight headaches, which come infrequently  6. Concentration 3 = I have a lot  of difficulty in concentrating when I want to  7. Work 3 =  I cannot do my usual work  8. Driving 3 = I  can't drive my car as long as I want because of moderate pain in my neck  9. Sleeping 1 = My sleep is slightly disturbed (less than 1 hr sleepless)  10. Recreation 3 = I am able to engage in a few of my usual recreation activities because of pain in   my neck  Total 19/50; 38%   Minimum Detectable Change (90% confidence): 5 points or 10% points  COGNITION: Overall cognitive status: Within functional limits for tasks assessed  SENSATION: WFL  POSTURE: rounded shoulders, forward head, and posterior pelvic tilt  PALPATION: Tightness noted in the L UT's much greater than the R    CERVICAL ROM:   Active ROM A/PROM (deg) eval AROM (deg) 06/15/24  Flexion 41 48  Extension 48 32  Right lateral flexion 30 32  Left lateral flexion 26 36  Right rotation 56 60  Left rotation 46 50  (Blank rows = not tested)  UPPER EXTREMITY ROM:  Active ROM Right eval Left eval  Shoulder flexion 157 137  Shoulder abduction 168 167   (Blank rows = not tested)  UPPER EXTREMITY MMT:  MMT Right eval Left eval  Shoulder flexion 4 4-  Shoulder extension 4 4-   (Blank rows = not tested)    TREATMENT DATE: 07/22/24  Self-Care/Home Management:  Pt educated on current prognosis and the lack of progress made at this time.  Therapist reached out to both PCP and referring provider and made them aware of the current situation regarding pt.  Pt was making good progress over the past several weeks with dry needling, however being unable to come last week due to the pain following her massage therapy appointment, coupled with other complications, pt may be better served with more imaging of the area.  Pt made aware of the situation and was educated on potential complications within the scope of therapist's practice.  Pt voiced understanding and agreeable to the change in current POC.      Manual:  Supine STM to cervical region to increase extensibility of the paraspinals Supine suboccipital release technique to decrease cervicalgia Supine manual traction performed in order to increase joint space in cervical region for pain relief  Supine UT/Levator stretch, 30 sec bouts to increase tissue extensibility of the cervical region     PATIENT EDUCATION:  Education details: See above section- self care for specifics. Person educated: Patient Education method: Explanation Education comprehension: verbalized understanding  HOME EXERCISE PROGRAM:  Access Code: [email or web address] URL: [email or web address] Date: 06/11/2024 Prepared by: Reyes London  Exercises - Seated Upper Trap Stretch  - 1-2 x daily - 3 sets - 30 sec hold - Gentle Levator Scapulae Stretch  - 1-2 x daily - 3 sets - 30 sec hold - Seated Assisted Cervical Rotation with Towel  - 1-2 x daily - 3 sets - 30 hold  ASSESSMENT:  CLINICAL IMPRESSION:  Pt has made little progress at this stage in therapy.  Pt was making good progress prior to the invident of going to massage therapy and coupled with her husband massaging the area, but has since regressed and has some concerning symptoms in th neck region.  PCP and referring provider notified of these concerns via secure chat.  Pt understanding and appreciative of the assistance from therapist at this point.  Pt encouraged to reach out and let therapist know what comes of the recommendation to return to MD's.  Pt is formally discharged at this time.       OBJECTIVE IMPAIRMENTS:  decreased activity tolerance, decreased ROM, decreased strength, hypomobility, and pain.   ACTIVITY LIMITATIONS: carrying and lifting  PARTICIPATION LIMITATIONS: driving  PERSONAL FACTORS: Fitness, Past/current experiences, and 3+ comorbidities: anxiety, lupus, thyroid  disease, sleep apnea are also affecting patient's functional outcome.   REHAB POTENTIAL:  Good  CLINICAL DECISION MAKING: Evolving/moderate complexity  EVALUATION COMPLEXITY: Low   GOALS: Goals reviewed with patient? Yes  SHORT TERM GOALS: Target date: 07/02/2024  Pt will be independent with HEP in order to demonstrate increased ability to perform tasks related to occupation/hobbies. Baseline: to be given at subsequent visit. Goal status: INITIAL   LONG TERM GOALS: Target date: 08/27/2024  1.  Pt will reduce their Neck Disability Index (NDI) score by at least 10 points through adherence to a physical therapy program to improve function and decrease neck-related limitations in daily activities Baseline: 19/50; 38% Goal status: INITIAL  2. Pt will increase active cervical left rotation to at least 60, through a targeted cervical mobility program in order to support improved driving visibility and daily head-turning tasks. Baseline: 46 deg Goal status: INITIAL  3.  Pt will improve active left shoulder flexion to at least 160, using therapeutic exercise focused on shoulder mobility, scapular stabilization, and rotator cuff activation, to enhance ability to reach overhead during ADLs. Baseline: 137 deg Goal status: INITIAL  4. Pt will increase active cervical flexion to at least 55, to support pain-free reading, looking down at objects, and performing daily tasks, through consistent stretching, strengthening, and manual therapy." Baseline: 41 deg Goal status:  INITIAL   PLAN:  PT FREQUENCY: 1-2x/week  PT DURATION: 12 weeks  PLANNED INTERVENTIONS: 97750- Physical Performance Testing, 97110-Therapeutic exercises, 97530- Therapeutic activity, [id number]- Neuromuscular re-education, 97535- Self Care, 02859- Manual therapy, [id number]- Canalith repositioning, [id number]- Traction (mechanical), Joint mobilization, Joint manipulation, Spinal manipulation, Spinal mobilization, Vestibular training, Cryotherapy, and Moist heat  PLAN FOR NEXT SESSION:   D/c at this time.   Fonda Simpers, PT, DPT Physical Therapist - [facility]  07/22/24, 3:34 PM

## 2024-07-22 NOTE — Progress Notes (Signed)
 Her PT Darrold) sent me a secure chat. See below:   She was improving with dry needling but is now worse. She had massage therapy a couple of weeks ago and noted to have boils around her neck following the treatment (they're no longer present), and it concerned her. She also seems to have some inflammation around where I would suspect her lymph nodes to be on the L side of the neck. I'm just not sure that she isn't going to need more imaging to see what's truly going on. Just wanted you to be aware and see what your thoughts were. Thanks so much!   Advised to have her f/u with PCP regarding new swelling/lymph nodes involvement. No fevers or chills but was having night sweats last week.   She is scheduled to follow up with me on 07/27/24.

## 2024-07-23 ENCOUNTER — Ambulatory Visit: Payer: Self-pay

## 2024-07-23 ENCOUNTER — Ambulatory Visit

## 2024-07-23 ENCOUNTER — Encounter: Payer: Self-pay | Admitting: Family Medicine

## 2024-07-23 VITALS — BP 145/81 | HR 86 | Temp 97.9°F | Ht 65.0 in | Wt 268.9 lb

## 2024-07-23 DIAGNOSIS — M542 Cervicalgia: Secondary | ICD-10-CM | POA: Diagnosis not present

## 2024-07-23 DIAGNOSIS — E042 Nontoxic multinodular goiter: Secondary | ICD-10-CM

## 2024-07-23 NOTE — Addendum Note (Signed)
 Addended by: FRANCHOT RAKE A on: 07/23/2024 11:46 AM   Modules accepted: Level of Service

## 2024-07-23 NOTE — Assessment & Plan Note (Signed)
 Patient with chronic, uncontrolled L neck pain. Saw PT yesterday who was concerned for possible enlarged cervical lymph node on L side. On my exam today, extensive lymph node exam performed without evidence of enlarged lymph nodes. 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 Hx of chronic night sweats and fatigue, but no new concerning symptoms.  - Discussed options with patient, could pursue imaging vs. monitoring. Patient opted for monitoring at this time. - Strict return precautions discussed - Has appt scheduled with NS next week and PCP appt in 1 month

## 2024-07-23 NOTE — Telephone Encounter (Signed)
 Patient call note and symptoms reviewed. Agree with scheduled appt.

## 2024-07-23 NOTE — Progress Notes (Addendum)
 Established patient visit   Patient: Ruth Woods   DOB: February 08, 1966   58 y.o. Female  MRN: [id number] Visit Date: 07/23/2024  Today's healthcare provider: Isaiah DELENA Pepper, MD   Chief Complaint  Patient presents with   Acute Visit    Patient is here for neck pain and swelling, went to physical therapy 2 weeks ago done some dry-needling ever since then she has been getting boils on her neck.  Physical therapist stated that she had swollen lymph nodes and told her to follow up with PCP.   Subjective     HPI:  Neck pain: - Saw PT yesterday, discharged due to lack of progress - Was getting dry needling done at PT, but has had pain since then - After dry needling, got massage 2 weeks ago and pain worsened - reports day/night sweats that have been occurring for a long time - Denies cold symptoms - has not been around any sick contacts - endorses L sided neck pain aggravating by movement - concerned for potential lymph node involvement - gets massages every other week - has enlarged goiter in neck, thyroid  nodules. Saw endocrinology -> goiter not cancerous and does not require removal. Going to endocrine again for second opinion - considering C2 block, has NS appt on 9/8 - takes Norco chronically - MRI previously declined by insurance  Medications: Outpatient Medications Prior to Visit  Medication Sig   ALPRAZolam  (XANAX ) 0.25 MG tablet Take 1 tablet (0.25 mg total) by mouth 2 (two) times daily as needed for anxiety.   HYDROcodone -acetaminophen  (NORCO/VICODIN) 5-325 MG tablet Take 1-2 tablets by mouth every 6 (six) hours as needed for moderate pain (pain score 4-6). Take 1-2 times daily as needed   hydrOXYzine  (ATARAX ) 25 MG tablet TAKE 0.5-1 TABLETS (12.5-25 MG TOTAL) BY MOUTH DAILY AS NEEDED.   meloxicam  (MOBIC ) 15 MG tablet Take 1 tablet (15 mg total) by mouth daily at 6 (six) AM.   No facility-administered medications prior to visit.    Review of Systems as  noted in HPI.      Objective    BP (!) 145/81 (BP Location: Left Arm, Patient Position: Sitting, Cuff Size: Normal)   Pulse 86   Temp 97.9 F (36.6 C) (Oral)   Ht 5' 5 (1.651 m)   Wt 268 lb 14.4 oz (122 kg)   SpO2 99%   BMI 44.75 kg/m    Physical Exam Constitutional:      Appearance: Normal appearance.  HENT:     Head: Normocephalic and atraumatic.     Mouth/Throat:     Mouth: Mucous membranes are moist.  Eyes:     Pupils: Pupils are equal, round, and reactive to light.  Neck:     Comments: No enlargement of cervical lymph nodes. Several nodes palpated that are soft, mobile, <1cm bilaterally.  Goiter present. TTP at L sternocleidomastoid muscle. Pulmonary:     Effort: Pulmonary effort is normal.  Lymphadenopathy:     Cervical: No cervical adenopathy.     Right cervical: No superficial, deep or posterior cervical adenopathy.    Left cervical: No superficial, deep or posterior cervical adenopathy.  Skin:    General: Skin is warm.  Neurological:     General: No focal deficit present.     Mental Status: She is alert.      No results found for any visits on 07/23/24.  Assessment & Plan     Problem List Items Addressed This Visit  Other   Neck pain on left side - Primary   Patient with chronic, uncontrolled L neck pain. Saw PT yesterday who was concerned for possible enlarged cervical lymph node on L side. On my exam today, extensive lymph node exam performed without evidence of enlarged lymph nodes. 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 Hx of chronic night sweats and fatigue, but no new concerning symptoms.  - Discussed options with patient, could pursue imaging vs. monitoring. Patient opted for monitoring at this time. - Strict return precautions discussed - Has appt scheduled with NS next week and PCP appt in 1 month      Reviewed 3+ external notes from PT, endocrinology, neurosurgery and neurology.  No follow-ups  on file.       Isaiah DELENA Pepper, MD  [facility] [phone] (phone) [phone] (fax)

## 2024-07-23 NOTE — Telephone Encounter (Signed)
 FYI Only or Action Required?: FYI only for provider.  Patient was last seen in primary care on 06/30/2024 by Sharma Coyer, MD.  Called Nurse Triage reporting Neck Pain.  Symptoms began past Thursday.  Interventions attempted: Nothing.  Symptoms are: gradually worsening.  Triage Disposition: See PCP When Office is Open (Within 3 Days)  Patient/caregiver understands and will follow disposition?: Yes    Copied from CRM [id number]. Topic: Clinical - Red Word Triage >> Jul 23, 2024  9:14 AM Turkey B wrote: Patient has swelling in her lymphnodes Reason for Disposition  [1] MODERATE neck pain (e.g., interferes with normal activities) AND [2] present > 3 days  Answer Assessment - Initial Assessment Questions 1. ONSET: When did the pain begin?      Thursday 2. LOCATION: Where does it hurt?      Neck  3. PATTERN Does the pain come and go, or has it been constant since it started?      Constant 4. SEVERITY: How bad is the pain?  (Scale 0-10; or none or slight stiffness, mild, moderate, severe)     moderate 5. RADIATION: Does the pain go anywhere else, shoot into your arms?     no 6. CORD SYMPTOMS: Any weakness or numbness of the arms or legs?     no 7. CAUSE: What do you think is causing the neck pain?     no 8. NECK OVERUSE: Any recent activities that involved turning or twisting the neck?     unknown 9. OTHER SYMPTOMS: Do you have any other symptoms? (e.g., headache, fever, chest pain, difficulty breathing, neck swelling)     Sweats, neck swelling - left, headache 2/10 10. PREGNANCY: Is there any chance you are pregnant? When was your last menstrual period?       na  Protocols used: Neck Pain or Stiffness-A-AH

## 2024-07-23 NOTE — Telephone Encounter (Signed)
Please see the message below and advise.

## 2024-07-24 NOTE — Progress Notes (Signed)
 Referring Physician:  Sharma Coyer, MD [address] [place],  [place] [address]  Primary Physician:  Sharma Coyer, MD  History of Present Illness: 07/27/2024 [name] has a history of discoid lupus, OSA, thyroid  disease, MS, chronic headaches, HTN, obesity.   Last seen by me on 06/03/24 with left sided neck pain after bilateral splenius capitis TPI by Dr. Vear (neurology) on 02/04/24.   She has known cervical spondylosis. 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   She was sent to PT for her neck. Discussed C2 block for diagnostic and therapeutic purposes. She declined.   PT messaged last week with concerns of swollen lymph nodes. She saw PCP on 07/23/24 and they elected to watch this. Per note, exam showed no lymph node swelling.   She is here for follow up.   She had increased pain/inflammation after dry needling with PT. She continues with constant left sided neck pain that radiates from below the left jaw to behind the left ear and down to the back of her neck. No pain in her arms, but she has numbness in left arm to index finger that is intermittent. No weakness. Has relief with heating pad.   She is getting ready to see general surgery to get her goiter removed.   Did not tolerate muscle relaxers (choking episodes) or neurontin  (suicidal ideation). PCP is prescribing norco. She is also taking mobic .   She does not smoke.   Conservative measures:  Physical therapy: initial eval on 06/04/24 and discharged after 7 visits on 07/22/24 due to lack of progress.  Multimodal medical therapy including regular antiinflammatories:  Hydrocodone , Meloxicam , Flexeril , Gabapentin  Injections:  no epidural steroid injections  Past Surgery: no spine or neck surgery  Ruth Woods has no symptoms of cervical myelopathy.  The symptoms are causing a significant impact on the patient's life.   Review of Systems:  A 10 point review  of systems is negative, except for the pertinent positives and negatives detailed in the HPI.  Past Medical History: Past Medical History:  Diagnosis Date   Allergy 2006   Levaquin   Anxiety    Arthritis 2010   Carpal tunnel syndrome 10/08/2018   Collagen vascular disease (HCC)    GERD (gastroesophageal reflux disease)    Lupus    Polyarthralgia 05/28/2019   Sleep apnea    Thyroid  disease 2006    Past Surgical History: Past Surgical History:  Procedure Laterality Date   ABDOMINAL HYSTERECTOMY     CESAREAN SECTION  1992   CHOLECYSTECTOMY     COSMETIC SURGERY  1987    Allergies: Allergies as of 07/27/2024 - Review Complete 07/27/2024  Allergen Reaction Noted   Levofloxacin Shortness Of Breath 05/28/2019   Flexeril  [cyclobenzaprine ]  06/03/2024   Gabapentin  Other (See Comments) 06/03/2024   Levaquin [levofloxacin in d5w]  07/18/2018   Penicillins Rash 07/18/2018    Medications: Outpatient Encounter Medications as of 07/27/2024  Medication Sig   ALPRAZolam  (XANAX ) 0.25 MG tablet Take 1 tablet (0.25 mg total) by mouth 2 (two) times daily as needed for anxiety.   HYDROcodone -acetaminophen  (NORCO/VICODIN) 5-325 MG tablet Take 1-2 tablets by mouth every 6 (six) hours as needed for moderate pain (pain score 4-6). Take 1-2 times daily as needed   hydrOXYzine  (ATARAX ) 25 MG tablet TAKE 0.5-1 TABLETS (12.5-25 MG TOTAL) BY MOUTH DAILY AS NEEDED.   meloxicam  (MOBIC ) 15 MG tablet Take 1 tablet (15 mg total) by mouth daily at 6 (six) AM.  No facility-administered encounter medications on file as of 07/27/2024.    Social History: Social History   Tobacco Use   Smoking status: Former    Current packs/day: 0.00    Types: Cigarettes    Quit date: 11/20/2015    Years since quitting: 8.6   Smokeless tobacco: Never  Vaping Use   Vaping status: Never Used  Substance Use Topics   Alcohol use: Not Currently    Alcohol/week: 1.0 standard drink of alcohol    Types: 1 Standard drinks or  equivalent per week    Comment: social   Drug use: Not Currently    Family Medical History: Family History  Problem Relation Age of Onset   Diabetes Mother    Breast cancer Mother    Cancer Mother    Pancreatic cancer Father    Arthritis Father    Cancer Father    Breast cancer Sister    Thyroid  cancer Sister    Cancer Sister    Skin cancer Brother    Diabetes Brother    Parkinson's disease Paternal Uncle    Diabetes Maternal Grandmother     Physical Examination: Vitals:   07/27/24 1037  BP: 118/72      Awake, alert, oriented to person, place, and time.  Speech is clear and fluent. Fund of knowledge is appropriate.   Cranial Nerves: Pupils equal round and reactive to light.  Facial tone is symmetric.    No posterior cervical tenderness. Mild occipital tenderness on left.   No abnormal lesions on exposed skin.   Strength: Side Biceps Triceps Deltoid Interossei Grip Wrist Ext. Wrist Flex.  R 5 5 5 5 5 5 5   L 5 5 5 5 5 5 5    Reflexes are 2+ and symmetric at the biceps, brachioradialis.   Hoffman's is present bilaterally.  Bilateral upper extremity sensation is intact to light touch.     Gait is normal.    Medical Decision Making  Imaging: None  Assessment and Plan: Ms. Simonian had bilateral splenius capitis TPI by Dr. Vear (neurology) on 02/04/24. This improved her headache that she had for months, but now she has constant left sided neck pain that radiates from below the left jaw to behind the left ear and down to the back of her neck. No pain in her arms, but she has numbness in left arm to index finger that is intermittent. No weakness.   She has known cervical spondylosis.   Patient reviewed with Dr. 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   Treatment options discussed with patient and following plan made:   - She wants to have goiter removed- getting ready to see general surgery for this.  - She will f/u with with me prn for now. - Will message her in  6 weeks to check on progress.  - Can also consider C2 block for diagnostic and therapeutic purposes.   I spent a total of 15 minutes in face-to-face and non-face-to-face activities related to this patient's care today including review of outside records, review of imaging, review of symptoms, physical exam, discussion of differential diagnosis, discussion of treatment options, and documentation.   Thank you for involving me in the care of this patient.   Glade Boys PA-C Dept. of Neurosurgery

## 2024-07-27 ENCOUNTER — Encounter: Payer: Self-pay | Admitting: Orthopedic Surgery

## 2024-07-27 ENCOUNTER — Ambulatory Visit (INDEPENDENT_AMBULATORY_CARE_PROVIDER_SITE_OTHER): Admitting: Orthopedic Surgery

## 2024-07-27 VITALS — BP 118/72 | Ht 64.0 in | Wt 269.5 lb

## 2024-07-27 DIAGNOSIS — M542 Cervicalgia: Secondary | ICD-10-CM

## 2024-07-27 DIAGNOSIS — M47812 Spondylosis without myelopathy or radiculopathy, cervical region: Secondary | ICD-10-CM | POA: Diagnosis not present

## 2024-07-30 ENCOUNTER — Encounter

## 2024-07-31 DIAGNOSIS — Z419 Encounter for procedure for purposes other than remedying health state, unspecified: Secondary | ICD-10-CM | POA: Diagnosis not present

## 2024-08-04 ENCOUNTER — Encounter

## 2024-08-04 DIAGNOSIS — E042 Nontoxic multinodular goiter: Secondary | ICD-10-CM | POA: Diagnosis not present

## 2024-08-04 DIAGNOSIS — Z01818 Encounter for other preprocedural examination: Secondary | ICD-10-CM | POA: Diagnosis not present

## 2024-08-11 ENCOUNTER — Encounter

## 2024-08-13 ENCOUNTER — Other Ambulatory Visit: Payer: Self-pay

## 2024-08-13 ENCOUNTER — Encounter: Payer: Self-pay | Admitting: Family Medicine

## 2024-08-13 DIAGNOSIS — M150 Primary generalized (osteo)arthritis: Secondary | ICD-10-CM

## 2024-08-13 DIAGNOSIS — M48061 Spinal stenosis, lumbar region without neurogenic claudication: Secondary | ICD-10-CM

## 2024-08-13 NOTE — Telephone Encounter (Signed)
 LOV 07/23/24 NOV 10/30/24 LRF 06/30/24 30 x 0

## 2024-08-14 MED ORDER — HYDROCODONE-ACETAMINOPHEN 5-325 MG PO TABS: 1.0000 | ORAL_TABLET | Freq: Four times a day (QID) | ORAL | 0 refills | Status: DC | PRN

## 2024-08-18 ENCOUNTER — Encounter

## 2024-08-25 ENCOUNTER — Encounter

## 2024-08-30 DIAGNOSIS — Z419 Encounter for procedure for purposes other than remedying health state, unspecified: Secondary | ICD-10-CM | POA: Diagnosis not present

## 2024-09-01 ENCOUNTER — Encounter

## 2024-09-07 ENCOUNTER — Ambulatory Visit: Admitting: Neurology

## 2024-09-08 ENCOUNTER — Encounter

## 2024-09-14 DIAGNOSIS — E042 Nontoxic multinodular goiter: Secondary | ICD-10-CM | POA: Diagnosis not present

## 2024-09-14 DIAGNOSIS — G35D Multiple sclerosis, unspecified: Secondary | ICD-10-CM | POA: Diagnosis not present

## 2024-09-14 DIAGNOSIS — G4731 Primary central sleep apnea: Secondary | ICD-10-CM | POA: Diagnosis not present

## 2024-09-14 DIAGNOSIS — E041 Nontoxic single thyroid nodule: Secondary | ICD-10-CM | POA: Diagnosis not present

## 2024-09-14 DIAGNOSIS — M329 Systemic lupus erythematosus, unspecified: Secondary | ICD-10-CM | POA: Diagnosis not present

## 2024-09-14 DIAGNOSIS — Z01818 Encounter for other preprocedural examination: Secondary | ICD-10-CM | POA: Diagnosis not present

## 2024-09-15 ENCOUNTER — Encounter

## 2024-09-21 DIAGNOSIS — C73 Malignant neoplasm of thyroid gland: Secondary | ICD-10-CM | POA: Diagnosis not present

## 2024-09-21 DIAGNOSIS — E042 Nontoxic multinodular goiter: Secondary | ICD-10-CM | POA: Diagnosis not present

## 2024-09-21 NOTE — H&P (Signed)
 IMPRESSION/ PLAN    I had the pleasure of seeing [name] in Endocrine Surgical consultation regarding her multinodular thyroid  with Bethesda III cytology, Afirma negative (left thyroid  nodule).  Large isthmus nodule 4.4. cm (Beth II) and Right 2.6 cm (Beth I x 2).  I discussed with the patient that based on this result the risk of malignancy is thankfully low and approximately 4% or less.  However, there is a slightly higher risk of false negative cytology for larger nodules > 4 cm.  Further, she is symptomatic from the standpoint of the size of nodularity.  I am recommending surgery. The patient will necessitate a Total Thyroidectomy.  I have discussed in detail the possible complications associated with thyroid  surgery, which may include (but not limited to) hoarseness, recurrent laryngeal nerve injury, superior laryngeal nerve injury - temporary or permanent, hypocalcemia and hypoparathyroidism - temporary or permanent, neck hematoma, infection, scarring and death. In the event of total thyroidectomy, she understood that thyroid  hormone replacement will be necessary lifelong and will need to be followed closely.   The patient expressed understanding of all the risks at hand, agreed with this plan and informed consent was signed and witnessed.  The patient has my contact information, and understands to call me with any additional questions or concerns in the interval.  Surgery is scheduled for 09/21/2024.   PLAN: The patient will need a CT neck without contrast  Diagnoses and all orders for this visit:   Multinodular goiter -     CT neck soft tissue without contrast; Future -     Renal Function Panel (RFP); Future -     Parathyroid Hormone (PTH); Future -     Thyroid  Profile (TSH and T4 Free); Future   Preop testing -     Complete Blood Count (CBC); Future   Thank you for the opportunity to partake in this patient's care. MANDY ISAIAH FERRIER, NP     Attending Attestation:      I  personally saw the patient and performed a substantive portion of this encounter in conjunction with the listed APP as documented below.  I have outlined the impression and plan as stated above and added history and clinical exam findings to the note where needed to augment the documentation of the APP noted below.   MICHAEL DWAYNE SATO, MD       PATIENT IDENTIFICATION / CHIEF COMPLAINT  Ruth Woods is a 58 y.o. female referred for evaluation of a bilateral multinodular goiter.  She is referred by Dr. Sharma.   HISTORY OF PRESENT ILLNESS  The patient is a 58 y.o. female who was found around 2011 during a regular physical exam to have fullness in the neck incidentally on imaging to have multinodular goiter. Biochemical testing of thyroid  function including a TSH documented normal TSH and normal free T4. Ultrasound shows a 4.4 cm isthmus, right 2.6 cm, and left 2.3 cm nodule. FNA has been done of all three nodules. FNA of the isthmus nodule was benign. FNA of the right nodule was Bethesda I-non-diagnostic x2 and FNA of the left nodule was Bethesda III, Afirma negative. This patient is here today to discuss recommendations for surgery. The patient denies hyperthyroid symptoms.  The patient denies hypothyroid symptoms.  The patient denies heat / cold intolerance, palpitations, and tremor.  She reports dysphagia, globus sensation,  compression symptoms, and anterior neck pain.  The patient has no hoarseness, voice changes or increased need to clear the throat.The patient has no history  of radiation exposure or goitrogens. There is a significant history of thyroid  cancer in the family. Her mother had follicular thyroid  cancer and her sister had follicular and papillary cancer. There is no significant history of familial endocrinopathy.     REVIEW OF SYSTEMS    A 14 point detailed review of system was reviewed with the patient and detailed on scanned, initialed forms in the EPIC encounter.   Pertinent positives are presented in the note.  I did review in detail: general/constitutional, dermatologic, ENT, ocular, cardiovascular, respiratory, GI, genitourinary, musculoskeletal, endocrine, allergy/immunological, hematologic, neurologic, psychiatric    MDASI     08/04/2024    8:36 AM  MDASI-THY  Pain 4  Fatigue 4  Nausea Not present  Disturbed sleep 4  Distress 2  Shortness of breath 10  Memory problems 4  Poor Appetite Not present  Sleepy not present  Dry mouth 7  Sad 2  Vomiting Not present  Numbness Not present  Hoarseness Not present  Feelling hot 2  Racing heartbeat 4  Feeling cold Not present  Difficulty swallowing 8  Diarrhea 2  General activity Did not interfere  Mood 5  Work Did not interfere  Relations 2  Walking Did not interfere  Enjoyment of life Did not interfere  Core Symptoms Score 37  Thyroid  Symptoms Score 16  QOL Symptoms Score 7  Total Symptom Burden 60      PAST MEDICAL/SURGICAL HISTORY / SOCIAL HISTORY / ALLERGY    Past Medical History      Past Medical History:  Diagnosis Date  . Arthritis    . Asthma, unspecified asthma severity, unspecified whether complicated, unspecified whether persistent (HHS-HCC)    . Autoimmune thyroiditis    . Cardiomegaly    . Discoid lupus erythematosus    . Diverticulitis    . Fatty liver    . Fibromyalgia    . Insomnia    . Nonrheumatic tricuspid (valve) insufficiency    . Nontoxic goiter, unspecified    . Prediabetes    . Sleep apnea       Allergies       Allergies  Allergen Reactions  . Levofloxacin Shortness Of Breath  . Cyclobenzaprine  Other (See Comments)      Choking episodes  . Gabapentin  Other (See Comments)      Suicidal ideation  . Penicillins Rash      Past Surgical History       Past Surgical History:  Procedure Laterality Date  . CESAREAN SECTION      . CHOLECYSTECTOMY      . HYSTERECTOMY SUPRACERVICAL ABDOMINAL W/REMOVAL TUBES &/OR OVARIES      . OSTEOPLASTY FACE          Social History         Tobacco Use  . Smoking status: Former      Current packs/day: 0.00      Types: Cigarettes      Quit date: 08/04/2014      Years since quitting: 10.0  . Smokeless tobacco: Never  Substance Use Topics  . Alcohol use: Yes      Comment: social        CURRENT MEDICATIONS    Current Medications        Current Outpatient Medications  Medication Sig Dispense Refill  . cholecalciferol 1000 unit tablet Take by mouth      . HYDROcodone -acetaminophen  (NORCO) 5-325 mg tablet Take 1 tablet by mouth 2 (two) times daily as needed      .  hydrOXYzine  (ATARAX ) 25 MG tablet Take 12.5-25 mg by mouth      . meloxicam  (MOBIC ) 15 MG tablet Take 15 mg by mouth      . diclofenac (VOLTAREN) 50 MG EC tablet diclofenac sodium 50 mg tablet,delayed release (Patient not taking: Reported on 08/04/2024)      . FLUoxetine (PROZAC) 20 MG capsule  (Patient not taking: Reported on 08/04/2024)      . hydrOXYchloroQUINE (PLAQUENIL) 200 mg tablet Take 200 mg by mouth 2 (two) times daily (Patient not taking: Reported on 08/04/2024)      . hydrOXYzine  pamoate (VISTARIL ) 25 MG capsule Take 25 mg by mouth nightly as needed (Patient not taking: Reported on 08/04/2024)        No current facility-administered medications for this visit.          FAMILY HISTORY    Family History        Family History  Problem Relation Name Age of Onset  . Rheum arthritis Father      . Ankylosing spondylitis Father            I have reviewed and updated the patient's history, problem list, past medical and surgical history, pertinent laboratory results, medications and allergies, as noted in the chart.   PHYSICAL EXAMINATION       Vitals:    08/04/24 0824  BP: (!) 141/84  Pulse: 80  Resp: 18  Temp: 36.5 C (97.7 F)    Body mass index is 43.1 kg/m. Constitutional: Well-developed and well-nourished female. No distress.  HENT: Mouth/Throat: Oropharynx is clear and moist.  Eyes: Conjunctivae are  normal. No scleral icterus.  Head/Neck: Thyroid  is enlarges and nodular. Moves well with swallowing. No pathologic cervical lymphadenopathy present in the bilateral neck. Cardiovascular: S1, S2, RRR. Intact distal pulses.   Pulmonary/Chest: CTA bil. No wheezing, rhonchi, or rales.   Abdominal: Soft without distension and or masses. No tenderness or hernia.  Musculoskeletal: Normal range of motion. Gait steady  Neurological: A& O x4. No cranial nerve deficit. Normal muscle tone and coordination.  Skin: Warm, dry, with adequate elasticity. No rash, diaphoresis, erythema, or pallor.  Psychiatric: Appropriate mood, behavior, and affect. Good insight into medical conditions.    LABS/STUDIES    08/22/2023 TSH+T4F+T3Free Order: [id number] Component Ref Range & Units 11 mo ago  TSH 0.450 - 4.500 uIU/mL 0.536  T3, Free 2.0 - 4.4 pg/mL 3.6  Free T4 0.82 - 1.77 ng/dL 8.66        3/88/75   US  THYROID    Anatomical Region Laterality Modality  -- -- Ultrasound    Narrative   CLINICAL DATA:  History of thyroid  nodules  EXAM: THYROID  ULTRASOUND  TECHNIQUE: Ultrasound examination of the thyroid  gland and adjacent soft tissues was performed.  COMPARISON:  None Available.  FINDINGS: Parenchymal Echotexture: Moderately heterogenous  Isthmus: 2.6 cm  Right lobe: 6.4 x 2.0 x 2.6 cm  Left lobe: 7.8 x 3.4 x 2.4 cm  _________________________________________________________  Estimated total number of nodules >/= 1 cm: 0  Number of spongiform nodules >/=  2 cm not described below (TR1): 0  Number of mixed cystic and solid nodules >/= 1.5 cm not described below (TR2): 0  _________________________________________________________  Nodule labeled 1 is a solid isoechoic TR 3 nodule in the thyroid  isthmus measuring 4.4 x 3.5 x 2.0 cm. **Given size (>/= 2.5 cm) and appearance, fine needle aspiration of this mildly suspicious nodule should be considered based on TI-RADS  criteria.  Nodule labeled 2  is a peripherally calcified and otherwise indeterminate TR 4 nodule in the superior right thyroid  lobe measuring up to 2.6 cm. **Given size (>/= 1.5 cm) and appearance, fine needle aspiration of this moderately suspicious nodule should be considered based on TI-RADS criteria.  Nodule labeled 3 is a solid and hypoechoic TR 4 nodule in the inferior left thyroid  lobe measuring 2.3 cm. **Given size (>/= 1.5 cm) and appearance, fine needle aspiration of this moderately suspicious nodule should be considered based on TI-RADS criteria.  IMPRESSION: 1. Multinodular thyroid  gland. 2. Nodules labeled 1, 2 and 3 all meet criteria for biopsy. Based on ACR TI-RADS criteria, the 2 most suspicious thyroid  nodules are recommended for biopsy. Therefore, nodules labeled 2 and 3 are recommended for biopsy. Note that these recommendations are made in the absence of any previous imaging and/or biopsy information. Correlate with any previous imaging and/or biopsy results if available.   Cytology 11/28/2023   Cytology - Non PAP; LT LOBE THYROID  Specimen: Fine Needle Aspirate - Structure of left lobe of thyroid  gland (body structure) Component 8 mo ago  CYTOLOGY - NON GYN CYTOLOGY - NON PAP CASE: [id number] PATIENT: Abra Chock Non-Gynecological Cytology Report     Clinical History: Nodule labeled 3 is a solid and hypoechoic TR 4 nodule in the inferior left thyroid  lobe measuring 2.3 cm. Specimen Submitted:  A. THYROID , LT LOBE, FINE NEEDLE ASPIRATION   FINAL MICROSCOPIC DIAGNOSIS: - Atypia of undetermined significance (Bethesda category III)  SPECIMEN ADEQUACY: Satisfactory for evaluation           Cytology - Non PAP; RT LOBE THYROID  Specimen: Fine Needle Aspirate - Structure of right lobe of thyroid  gland (body structure) Component 8 mo ago  CYTOLOGY - NON GYN CYTOLOGY - NON PAP CASE: [id number] PATIENT: Wiletta Valladares Non-Gynecological  Cytology Report     Clinical History: Nodule labeled 2 is a peripherally calcified and otherwise indeterminate TR 4 nodule in the superior right thyroid  lobe measuring up to 2.6 cm. Specimen Submitted:  A. THYROID , RT LOBE, FINE NEEDLE ASPIRATION   FINAL MICROSCOPIC DIAGNOSIS: - Scant follicular epithelium present (Bethesda category I) - Contents of a cyst  SPECIMEN ADEQUACY: Satisfactory for evaluation  GROSS: Received is/are 30cc's of clear, colorless cytolyt solution.(ME:me) Smears: 0            Cytology: 07/02/2023   Cytology - Non PAP; RT LOBE THYROID  Specimen: Fine Needle Aspirate - Structure of right lobe of thyroid  gland (body structure) Component 1 yr ago  CYTOLOGY - NON GYN CYTOLOGY - NON PAP CASE: [id number] PATIENT: Nyja Colarusso Non-Gynecological Cytology Report     Clinical History: Nodule labeled 2 is peripherally calcified and other wise indeterminate TR 4 nodule in the superior right thyroid  lobe measuring up to 2.6cm. Given size(>/=1.5cm) and appearance, fine needle aspiration of this moderately suspicious nodule should be consid Specimen Submitted:  A. THYROID , RT SUPERIOR NODULE#2, FINE NEEDLE ASPIRATION   FINAL MICROSCOPIC DIAGNOSIS: - Scant follicular epithelium present (Bethesda category I) - Findings suggestive of cyst contents  SPECIMEN ADEQUACY: Satisfactory but limited for evaluation, scant cellularity        Cytology - Non PAP; THYROID  ISTHMUS Specimen: Fine Needle Aspirate - Thyroid  structure (body structure) Component 1 yr ago  CYTOLOGY - NON GYN CYTOLOGY - NON PAP CASE: [id number] PATIENT: Krishika Palomino Non-Gynecological Cytology Report     Clinical History: Nodule labeled 1 is solid isoechoic TR3 nodule in the thyroid  isthmus measuring 4.4 x 3.5 x 2.0cm. Given size(>/=2.5cm) and appearance, fine  needle aspiration of this mildly suspicious nodule should be considered based on TI-RADS criteria Specimen  Submitted:  A. THYROID , ISTHMUS NODULE#1, FINE NEEDLE ASPIRATION   FINAL MICROSCOPIC DIAGNOSIS: - Benign follicular nodule (Bethesda category II)

## 2024-09-22 NOTE — Progress Notes (Signed)
 General Surgery Progress Note     Date of Service: 09/22/2024 Hospital Admission Date:   09/21/2024   Hosp day: Hospital Day: 2                  Postop day 1 Day Post-Op  Brief HPI: Ruth Woods is a 58 y.o. female with cardiomegaly, lupus, diverticulitis, fatty liver, fibromylagia, tricuspid valve insufficiency, nontoxic goiter, AI thyroiditis now s/p total thyroidectomy.   24 hour events   -Doing well this morning -Satting well without issues -Tolerating PO -Denies voice changes -Describes some pain around the incision but is controlled with pain medications -Neck is soft, supple, compressible  Vital Signs     Current Vital Signs 24h Vital Sign Ranges  T 36.4 C (97.6 F) (09/22/24 1048) Temp  Avg: 36.7 C (98 F)  Min: 36.4 C (97.6 F)  Max: 36.8 C (98.3 F)  BP 117/68 (09/22/24 1048) BP  Min: 117/68  Max: 147/84  HR 93 (09/22/24 0352) Pulse  Avg: 104.1  Min: 93  Max: 111  RR 16 (09/22/24 1048) Resp  Avg: 18.3  Min: 11  Max: 24  O2sat 96 % Nasal cannula (09/21/24 1822) SpO2  Avg: 94.9 %  Min: 91 %  Max: 96 %   Intake/Output (24 hours): I/O last 2 completed shifts: In: 2630 [P.O.:630; I.V.:2000] Out: 600 [Urine:600] Last Weight: (!) 122 kg (268 lb 15.4 oz) (09/21/24 1200)  Admit Weight: (!) 122 kg (268 lb 15.4 oz) (09/21/24 1200)  AFP:Anib mass index is 43.41 kg/m. Height: 167.6 cm (5' 6)    Physical Exam   General: alert, cooperative, in NAD Cardiovascular: regular rate and rhythm Respiratory: nonlabored breathing on 2L [place] saturating 96% Abdomen: soft, nondistended, nontender Extremities: no lower extremity edema, no neuralgia  Skin: warm and well-perfused Incisions: clean, dry, intact, without surrounding erythema or drainage, no hematoma noted   Data   Labs:  Recent Labs  Lab 09/21/24 1620 09/22/24 0223  CALCIUM 8.6* 9.2   No results for input(s): ALT, AST, ALKPHOS, TBILI, ALB in the last 168 hours.   No results for input(s): WBC,  HGB, HCT, PLT in the last 168 hours. No results for input(s): APTT, PROTIME, INR in the last 168 hours.   Glucose range:  Recent Labs  Lab 09/21/24 1653  POCGLU 193*   Additional Labs: No results for input(s): COLORU, CLARITYU, SPECGRAV, LABPH, PROTEINUA, GLUCOSEU, KETONESU, BLOODU, NITRITE, LEUKOCYTESUR, BILIRUBINUR, UROBILINOGEN, RBCUA, WBCUA, SQUAMEPI, UOTHEPI, HYALINE, CASTUA, BACTERIA, UCLINITST in the last 168 hours.  Microbiology: No results found for: BLDCULT No results found for: URCULT No results found for: CULTRESP No results found for: GARDNER DUNKS, BFLDC, TISSUEC    Assessment & Plan   [name] is a 58 y.o. female s/p total thyroidectomy who is now recovering well.   Awake and alert in short stay unit.  Hemodynamically stable on monitor. Pain is well controlled.  Surgical site/s benign.   Denies n/v, f/c, cp/sob. Does not wear oxygen at home and able to ambulate post operatively.   - Diet: Diet regular - Analgesia: multimodal pain control regimen  - Activity: Incentive spirometer use by nursing and encourage out of bed & ambulation - Plan to d/c to home with TUMS Ultra and levothyroxine and clinic follow up     Barton Schmitz MD, MA General Surgery SAR-1 Service Pager: [phone] 09/22/2024   ------------------------------------------------------------------------------- Attestation signed by Sibyl Ozell Arabia, MD at 09/24/2024 12:24 PM  Attestation Statement:   I personally saw and  evaluated the patient, and participated in the management and treatment plan as documented in the resident/fellow note.  MICHAEL DWAYNE SATO, MD  -------------------------------------------------------------------------------

## 2024-09-26 DIAGNOSIS — R69 Illness, unspecified: Secondary | ICD-10-CM | POA: Diagnosis not present

## 2024-09-30 DIAGNOSIS — Z419 Encounter for procedure for purposes other than remedying health state, unspecified: Secondary | ICD-10-CM | POA: Diagnosis not present

## 2024-10-01 ENCOUNTER — Emergency Department

## 2024-10-01 ENCOUNTER — Ambulatory Visit: Admitting: Family Medicine

## 2024-10-01 ENCOUNTER — Emergency Department: Admission: EM | Admit: 2024-10-01 | Discharge: 2024-10-01 | Disposition: A | Discharge status: Home / Self Care

## 2024-10-01 ENCOUNTER — Telehealth: Payer: Self-pay

## 2024-10-01 ENCOUNTER — Other Ambulatory Visit: Payer: Self-pay

## 2024-10-01 DIAGNOSIS — R519 Headache, unspecified: Secondary | ICD-10-CM | POA: Diagnosis not present

## 2024-10-01 DIAGNOSIS — R072 Precordial pain: Secondary | ICD-10-CM | POA: Insufficient documentation

## 2024-10-01 DIAGNOSIS — R0602 Shortness of breath: Secondary | ICD-10-CM | POA: Diagnosis not present

## 2024-10-01 DIAGNOSIS — Z9889 Other specified postprocedural states: Secondary | ICD-10-CM

## 2024-10-01 DIAGNOSIS — R079 Chest pain, unspecified: Secondary | ICD-10-CM | POA: Diagnosis not present

## 2024-10-01 DIAGNOSIS — Z9089 Acquired absence of other organs: Secondary | ICD-10-CM | POA: Diagnosis not present

## 2024-10-01 DIAGNOSIS — R0600 Dyspnea, unspecified: Secondary | ICD-10-CM | POA: Diagnosis not present

## 2024-10-01 LAB — CBC
HCT: 42.4 % (ref 36.0–46.0)
Hemoglobin: 13.5 g/dL (ref 12.0–15.0)
MCH: 26.2 pg (ref 26.0–34.0)
MCHC: 31.8 g/dL (ref 30.0–36.0)
MCV: 82.3 fL (ref 80.0–100.0)
Platelets: 339 K/uL (ref 150–400)
RBC: 5.15 MIL/uL — ABNORMAL HIGH (ref 3.87–5.11)
RDW: 14.4 % (ref 11.5–15.5)
WBC: 8.5 K/uL (ref 4.0–10.5)
nRBC: 0 % (ref 0.0–0.2)

## 2024-10-01 LAB — BASIC METABOLIC PANEL WITH GFR
Anion gap: 11 (ref 5–15)
BUN: 10 mg/dL (ref 6–20)
CO2: 25 mmol/L (ref 22–32)
Calcium: 9.8 mg/dL (ref 8.9–10.3)
Chloride: 103 mmol/L (ref 98–111)
Creatinine, Ser: 0.59 mg/dL (ref 0.44–1.00)
GFR, Estimated: >60 mL/min (ref >60)
Glucose, Bld: 93 mg/dL (ref 70–99)
Potassium: 4.2 mmol/L (ref 3.5–5.1)
Sodium: 139 mmol/L (ref 135–145)

## 2024-10-01 LAB — TROPONIN T, HIGH SENSITIVITY: Troponin T High Sensitivity: <15 ng/L (ref 0–19)

## 2024-10-01 LAB — TSH: TSH: 1.78 u[IU]/mL (ref 0.350–4.500)

## 2024-10-01 MED ORDER — IOHEXOL 350 MG/ML SOLN
75.0000 mL | Freq: Once | INTRAVENOUS | Status: AC | PRN
  Administered 2024-10-01: 75 mL via INTRAVENOUS

## 2024-10-01 NOTE — Telephone Encounter (Signed)
 PT Telephone encounter reviewed.

## 2024-10-01 NOTE — Discharge Instructions (Signed)
 You were seen today due to concern of chest pain and shortness of breath.  At this time fortunately your labs and imaging are reassuring, I would recommend following up with your primary doctor and your surgeon to discuss further management.  If you have any worsening of symptoms such as increased chest pain, shortness of breath, lightheadedness, or any other symptoms you find concerning please return to the emergency department immediately for further medical management.

## 2024-10-01 NOTE — ED Provider Notes (Signed)
 [facility] Provider Note    Event Date/Time   First MD Initiated Contact with Patient 10/01/24 1404     (approximate)   History   Shortness of Breath   HPI  Samyra Limb is a 58 y.o. female who presents today with complaint of shortness of breath.  Patient states that she had a total thyroidectomy about 10 days ago with Dr. Sibyl at [facility].  Afterwards was feeling much better, 4 days ago started having some increased shortness of breath primarily with exertion, and also developed some substernal chest pain.  Denies symptoms of neck swelling or pain, states that surgical site has been healing well without any other issues associated with it.  No history of similar pain before in the past, described as a pressure-like sensation.  She has no known history of PEs or DVTs and has not noticed any increased swelling in her extremities.  She also denies any history of ACS or cardiac disease history.      Physical Exam   Triage Vital Signs: ED Triage Vitals  Encounter Vitals Group     BP 10/01/24 1209 (!) 146/87     Girls Systolic BP Percentile --      Girls Diastolic BP Percentile --      Boys Systolic BP Percentile --      Boys Diastolic BP Percentile --      Pulse Rate 10/01/24 1209 78     Resp 10/01/24 1209 17     Temp 10/01/24 1209 98.5 F (36.9 C)     Temp Source 10/01/24 1209 Oral     SpO2 10/01/24 1209 99 %     Weight 10/01/24 1210 266 lb (120.7 kg)     Height 10/01/24 1210 5' 6 (1.676 m)     Head Circumference --      Peak Flow --      Pain Score 10/01/24 1206 2     Pain Loc --      Pain Education --      Exclude from Growth Chart --     Most recent vital signs: Vitals:   10/01/24 1209 10/01/24 1418  BP: (!) 146/87   Pulse: 78   Resp: 17   Temp: 98.5 F (36.9 C)   SpO2: 99% 99%     General: Awake, no distress.  Neck:  Surgical site appears clean with no significant surrounding erythema or swelling CV:  Good peripheral  perfusion.  Regular rate and rhythm Resp:  Normal effort.  No acute distress Abd:  No distention.  Soft nondistended nontender Other:     ED Results / Procedures / Treatments   Labs (all labs ordered are listed, but only abnormal results are displayed) Labs Reviewed  CBC - Abnormal; Notable for the following components:      Result Value   RBC 5.15 (*)    All other components within normal limits  BASIC METABOLIC PANEL WITH GFR  TSH  TROPONIN T, HIGH SENSITIVITY     EKG  Sinus rhythm with a rate of about 80, axis of -20, intervals appear to be within normal limits, no obvious ischemia that appreciate this EKG   RADIOLOGY On my independent interpretation, no acute findings noted on chest x-ray  PROCEDURES:  Critical Care performed: No  Procedures   MEDICATIONS ORDERED IN ED: Medications  iohexol  (OMNIPAQUE ) 350 MG/ML injection 75 mL (75 mLs Intravenous Contrast Given 10/01/24 1527)     IMPRESSION / MDM / ASSESSMENT AND  PLAN / ED COURSE  I reviewed the triage vital signs and the nursing notes.                               Patient's presentation is most consistent with acute, uncomplicated illness.  58 year old female who presents today with concern of chest pain and shortness of breath.  She appears well she is not in acute distress, while here she states that the shortness of breath and the chest pain have actually improved but continues to present.  Her vitals here are stable with O2 sat of 100% on room air.  EKG without acute ischemia troponin is negative here.  Given recent surgery and her complaints, reasonable to obtain CT imaging of the chest and will also obtain CT imaging of the neck to ensure no other surgical etiology causing her symptoms of shortness of breath.  Will follow-up results and determine safe disposition accordingly.   Clinical Course as of 10/01/24 1654  Thu Oct 01, 2024  1626 CT imaging is without acute findings, possible phlegmon or  postsurgical seroma.  Radiologist also considers possible abscess however clinically this does not seem to be the case, patient does not have any signs or symptoms of systemic infection reassuring white blood cell count I feel this is very unlikely.  I did page out to [facility] surgery team for discussion and patient's presentation today but ultimately suspect reasonable for discharge home and outpatient follow-up. [SK]    Clinical Course User Index [SK] Fernand Rossie HERO, MD     FINAL CLINICAL IMPRESSION(S) / ED DIAGNOSES   Final diagnoses:  Dyspnea, unspecified type  H/O thyroidectomy     Rx / DC Orders   ED Discharge Orders     None        Note:  This document was prepared using Dragon voice recognition software and may include unintentional dictation errors.   Fernand Rossie HERO, MD 10/01/24 [phone]

## 2024-10-01 NOTE — Telephone Encounter (Signed)
 Patient had appointment scheduled for today by E2C2 agent for trouble breathing- no triage encounter was completed. I called patient to gather more information. Patient explained that she is s/p thyroidectomy as of 09/21/2024. For 4 days she has been experiencing heaviness in her chest, difficulty breathing, dull pain in upper back/ chest area and headache. After speaking with Dr. Lang who is patient's pcp and the encounter provider- Dr. Lang recommended for patient to be evaluated urgently in the ER for these new symptoms. I spoke with patient and patient agreed to be seen in ED. Appointment for this afternoon cancelled

## 2024-10-01 NOTE — ED Triage Notes (Signed)
 Pt arrives via POV with c/o SOB x4 days that feels like someone is sitting on their chest and they are experiencing back pain. Pt had a thyroidectomy on 11/3 at [facility]. Pt is A&Ox4 during triage.

## 2024-10-01 NOTE — ED Notes (Signed)
 Pt given DC instructions. Pt verbalized understanding of follow up care. Pt ambulatory from ED without difficulty.

## 2024-10-06 DIAGNOSIS — E042 Nontoxic multinodular goiter: Secondary | ICD-10-CM | POA: Diagnosis not present

## 2024-10-23 DIAGNOSIS — J0100 Acute maxillary sinusitis, unspecified: Secondary | ICD-10-CM | POA: Diagnosis not present

## 2024-10-23 DIAGNOSIS — R059 Cough, unspecified: Secondary | ICD-10-CM | POA: Diagnosis not present

## 2024-10-23 DIAGNOSIS — H6691 Otitis media, unspecified, right ear: Secondary | ICD-10-CM | POA: Diagnosis not present

## 2024-10-30 ENCOUNTER — Ambulatory Visit: Admitting: Family Medicine

## 2024-10-30 VITALS — BP 134/80 | HR 75 | Ht 66.0 in | Wt 270.2 lb

## 2024-10-30 DIAGNOSIS — G4731 Primary central sleep apnea: Secondary | ICD-10-CM

## 2024-10-30 DIAGNOSIS — I10 Essential (primary) hypertension: Secondary | ICD-10-CM | POA: Diagnosis not present

## 2024-10-30 DIAGNOSIS — M48061 Spinal stenosis, lumbar region without neurogenic claudication: Secondary | ICD-10-CM | POA: Diagnosis not present

## 2024-10-30 DIAGNOSIS — E559 Vitamin D deficiency, unspecified: Secondary | ICD-10-CM

## 2024-10-30 DIAGNOSIS — Z791 Long term (current) use of non-steroidal anti-inflammatories (NSAID): Secondary | ICD-10-CM | POA: Diagnosis not present

## 2024-10-30 DIAGNOSIS — C73 Malignant neoplasm of thyroid gland: Secondary | ICD-10-CM | POA: Insufficient documentation

## 2024-10-30 DIAGNOSIS — Z9889 Other specified postprocedural states: Secondary | ICD-10-CM | POA: Diagnosis not present

## 2024-10-30 DIAGNOSIS — E66813 Obesity, class 3: Secondary | ICD-10-CM | POA: Diagnosis not present

## 2024-10-30 DIAGNOSIS — E890 Postprocedural hypothyroidism: Secondary | ICD-10-CM

## 2024-10-30 DIAGNOSIS — M150 Primary generalized (osteo)arthritis: Secondary | ICD-10-CM | POA: Diagnosis not present

## 2024-10-30 DIAGNOSIS — G8929 Other chronic pain: Secondary | ICD-10-CM

## 2024-10-30 DIAGNOSIS — Z1231 Encounter for screening mammogram for malignant neoplasm of breast: Secondary | ICD-10-CM

## 2024-10-30 DIAGNOSIS — F419 Anxiety disorder, unspecified: Secondary | ICD-10-CM | POA: Diagnosis not present

## 2024-10-30 DIAGNOSIS — G4701 Insomnia due to medical condition: Secondary | ICD-10-CM | POA: Diagnosis not present

## 2024-10-30 DIAGNOSIS — G35D Multiple sclerosis, unspecified: Secondary | ICD-10-CM | POA: Diagnosis not present

## 2024-10-30 DIAGNOSIS — Z5181 Encounter for therapeutic drug level monitoring: Secondary | ICD-10-CM

## 2024-10-30 DIAGNOSIS — L930 Discoid lupus erythematosus: Secondary | ICD-10-CM

## 2024-10-30 DIAGNOSIS — Z419 Encounter for procedure for purposes other than remedying health state, unspecified: Secondary | ICD-10-CM | POA: Diagnosis not present

## 2024-10-30 DIAGNOSIS — Z9089 Acquired absence of other organs: Secondary | ICD-10-CM | POA: Diagnosis not present

## 2024-10-30 MED ORDER — HYDROCODONE-ACETAMINOPHEN 5-325 MG PO TABS: 1.0000 | ORAL_TABLET | Freq: Four times a day (QID) | ORAL | 0 refills | Status: AC | PRN

## 2024-10-30 MED ORDER — IBUPROFEN 600 MG PO TABS: 600.0000 mg | ORAL_TABLET | Freq: Three times a day (TID) | ORAL | 2 refills | Status: AC | PRN

## 2024-10-30 MED ORDER — HYDROXYZINE HCL 10 MG PO TABS: 10.0000 mg | ORAL_TABLET | Freq: Every evening | ORAL | 6 refills | Status: DC | PRN

## 2024-10-30 MED ORDER — ALPRAZOLAM 0.25 MG PO TABS: 0.2500 mg | ORAL_TABLET | Freq: Two times a day (BID) | ORAL | 0 refills | Status: AC | PRN

## 2024-10-30 MED ORDER — ETODOLAC ER 500 MG PO TB24: 500.0000 mg | ORAL_TABLET | Freq: Every day | ORAL | 1 refills | Status: AC

## 2024-10-30 NOTE — Patient Instructions (Signed)

## 2024-10-30 NOTE — Progress Notes (Signed)
 Established patient visit   Patient: Ruth Woods   DOB: Oct 06, 1966   58 y.o. Female  MRN: [id number] Visit Date: 10/30/2024  Today's healthcare provider: Rockie Agent, MD   Chief Complaint  Patient presents with   Medical Management of Chronic Issues    4 month for Chronic Pain f/u,anxiety. needs mammo.   Pain   Anxiety    She reports anxiety is moderate and can be better at times but sometimes worse depending on the situation.    Thyroid  Cancer    Thyroidectomy completed in November and would like it managed here   Subjective     HPI     Medical Management of Chronic Issues    Additional comments: 4 month for Chronic Pain f/u,anxiety. needs mammo.        Anxiety    Additional comments: She reports anxiety is moderate and can be better at times but sometimes worse depending on the situation.         Thyroid  Cancer    Additional comments: Thyroidectomy completed in November and would like it managed here      Last edited by Lilian Fitzpatrick, CMA on 10/30/2024  8:28 AM.       Discussed the use of AI scribe software for clinical note transcription with the patient, who gave verbal consent to proceed.  History of Present Illness Ruth Woods is a 58 year old female with chronic pain and hypothyroidism who presents for pain management and thyroid  care.  She experiences chronic pain, which she describes as 'pretty bad' but not worse than usual. Initially prescribed oxycodone  post-surgery, she did not complete the prescription and instead uses hydrocodone -acetaminophen , taking 1-2 tablets every six hours as needed. She also takes meloxicam  15 mg once daily but finds it less effective, often supplementing with over-the-counter Advil , which provides relief.  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 Post-surgery, she reports improved breathing and swallowing, stating 'I can breathe  great. I can swallow great.' She is awaiting further consultation for potential radioactive iodine treatment due to lymphatic invasion noted during surgery. She is currently on Synthroid 150 mcg daily for hypothyroidism and is due for thyroid  function tests as she is six weeks post-surgery.  She has a history of anxiety, for which she takes Xanax  0.25 mg twice daily as needed. Her GAD-7 score is 8, and PHQ-9 is 5, indicating moderate anxiety and mild depression.  Her medical history includes chronic GERD, lupus, polyarthralgia, sleep apnea, carpal tunnel syndrome, and arthritis. She reports chronic pain associated with lupus and MS but is not currently on medication for MS due to concerns about side effects. She manages her symptoms with hydrocodone  and NSAIDs.  She recently completed a course of doxycycline for sinusitis following a period of violent coughing and sneezing post-surgery, which led to a seroma. She reports improvement in symptoms and is on the last day of her antibiotic course.     10/30/2024    8:28 AM 06/30/2024    8:15 AM 06/01/2024   10:05 AM 04/09/2024    8:08 AM  GAD 7 : Generalized Anxiety Score  Nervous, Anxious, on Edge 3 3 2 2   Control/stop worrying 1 3 2 2   Worry too much - different things 1 3 2 1   Trouble relaxing 2 3 3 3   Restless 0 0 0 0  Easily annoyed or irritable 1 3 2 3   Afraid - awful might happen 0  0 0 0  Total GAD 7 Score 8 15 11 11   Anxiety Difficulty Somewhat difficult Extremely difficult Very difficult Extremely difficult    Flowsheet Row Office Visit from 10/30/2024 in [facility]  PHQ-9 Total Score 5      Past Medical History:  Diagnosis Date   Allergy 2006   Levaquin   Anxiety    Arthritis 2010   Carpal tunnel syndrome 10/08/2018   Collagen vascular disease    GERD (gastroesophageal reflux disease)    Lupus    Polyarthralgia 05/28/2019   Sleep apnea    Thyroid  disease 2006    Medications: Show/hide  medication list[1]  Review of Systems  Last CBC Lab Results  Component Value Date   WBC 7.8 10/30/2024   HGB 13.4 10/30/2024   HCT 41.4 10/30/2024   MCV 82 10/30/2024   MCH 26.5 (L) 10/30/2024   RDW 14.4 10/30/2024   PLT 333 10/30/2024   Last metabolic panel Lab Results  Component Value Date   GLUCOSE 100 (H) 10/30/2024   NA 143 10/30/2024   K 4.8 10/30/2024   CL 103 10/30/2024   CO2 23 10/30/2024   BUN 12 10/30/2024   CREATININE 0.61 10/30/2024   GFRNONAA >60 10/01/2024   CALCIUM 9.6 10/30/2024   PROT 6.8 10/30/2024   ALBUMIN 4.3 10/30/2024   LABGLOB 2.5 10/30/2024   AGRATIO 1.4 02/20/2023   BILITOT 0.3 10/30/2024   ALKPHOS 108 10/30/2024   AST 13 10/30/2024   ALT 23 10/30/2024   ANIONGAP 11 10/01/2024   Last lipids Lab Results  Component Value Date   CHOL 190 06/01/2024   HDL 49 06/01/2024   LDLCALC 102 (H) 06/01/2024   TRIG 229 (H) 06/01/2024   CHOLHDL 3.9 06/01/2024   Last hemoglobin A1c Lab Results  Component Value Date   HGBA1C 6.2 (H) 06/01/2024   Last thyroid  functions Lab Results  Component Value Date   TSH 4.110 10/30/2024   FREET4 1.48 10/30/2024        Objective    BP 134/80 (BP Location: Left Arm, Patient Position: Sitting, Cuff Size: Large)   Pulse 75   Ht 5' 6 (1.676 m)   Wt 270 lb 3.2 oz (122.6 kg)   SpO2 98%   BMI 43.61 kg/m  BP Readings from Last 3 Encounters:  10/30/24 134/80  10/01/24 124/81  07/27/24 118/72   Wt Readings from Last 3 Encounters:  10/30/24 270 lb 3.2 oz (122.6 kg)  10/01/24 266 lb (120.7 kg)  07/27/24 269 lb 8 oz (122.2 kg)        Physical Exam  Physical Exam MEASUREMENTS: Weight- 270. GENERAL: Well appearing, alert, cooperative, well developed, no acute distress NECK: Non-tender, no erythema, healing scar from thyroidectomy CHEST: Clear to auscultation bilaterally, no wheezes, rhonchi, or crackles CARDIOVASCULAR: Regular rate and rhythm, S1 and S2 normal without murmurs ABDOMEN: Normal bowel  sounds EXTREMITIES: No lower extremity edema NEUROLOGICAL: Alert and oriented PSYCHIATRIC: Not anxious appearing, not tearful    Results for orders placed or performed in visit on 10/30/24  CMP14+EGFR  Result Value Ref Range   Glucose 100 (H) 70 - 99 mg/dL   BUN 12 6 - 24 mg/dL   Creatinine, Ser 9.38 0.57 - 1.00 mg/dL   eGFR 896 >40 fO/fpw/8.26   BUN/Creatinine Ratio 20 9 - 23   Sodium 143 134 - 144 mmol/L   Potassium 4.8 3.5 - 5.2 mmol/L   Chloride 103 96 - 106 mmol/L   CO2 23  20 - 29 mmol/L   Calcium 9.6 8.7 - 10.2 mg/dL   Total Protein 6.8 6.0 - 8.5 g/dL   Albumin 4.3 3.8 - 4.9 g/dL   Globulin, Total 2.5 1.5 - 4.5 g/dL   Bilirubin Total 0.3 0.0 - 1.2 mg/dL   Alkaline Phosphatase 108 49 - 135 IU/L   AST 13 0 - 40 IU/L   ALT 23 0 - 32 IU/L  CBC  Result Value Ref Range   WBC 7.8 3.4 - 10.8 x10E3/uL   RBC 5.06 3.77 - 5.28 x10E6/uL   Hemoglobin 13.4 11.1 - 15.9 g/dL   Hematocrit 58.5 65.9 - 46.6 %   MCV 82 79 - 97 fL   MCH 26.5 (L) 26.6 - 33.0 pg   MCHC 32.4 31.5 - 35.7 g/dL   RDW 85.5 88.2 - 84.5 %   Platelets 333 150 - 450 x10E3/uL  TSH+T4F+T3Free  Result Value Ref Range   TSH 4.110 0.450 - 4.500 uIU/mL   T3, Free 2.8 2.0 - 4.4 pg/mL   Free T4 1.48 0.82 - 1.77 ng/dL    Assessment & Plan     Problem List Items Addressed This Visit     Anxiety   Relevant Medications   hydrOXYzine  (ATARAX ) 10 MG tablet   ALPRAZolam  (XANAX ) 0.25 MG tablet   Central sleep apnea   Discoid lupus 2010   Insomnia secondary to chronic pain   Relevant Medications   etodolac  (LODINE  XL) 500 MG 24 hr tablet   ibuprofen  (ADVIL ) 600 MG tablet   HYDROcodone -acetaminophen  (NORCO/VICODIN) 5-325 MG tablet   hydrOXYzine  (ATARAX ) 10 MG tablet   Long term (current) use of non-steroidal anti-inflammatories (nsaid)   Multiple sclerosis   Obesity, Class III, BMI 40-49.9 (morbid obesity) (HCC)   Papillary thyroid  carcinoma (HCC) - Primary   Relevant Medications   levothyroxine (SYNTHROID) 150  MCG tablet   ALPRAZolam  (XANAX ) 0.25 MG tablet   Primary hypertension   Relevant Orders   CMP14+EGFR (Completed)   Primary osteoarthritis involving multiple joints   Relevant Medications   etodolac  (LODINE  XL) 500 MG 24 hr tablet   ibuprofen  (ADVIL ) 600 MG tablet   HYDROcodone -acetaminophen  (NORCO/VICODIN) 5-325 MG tablet   Spinal stenosis of lumbar region without neurogenic claudication   Relevant Medications   etodolac  (LODINE  XL) 500 MG 24 hr tablet   ibuprofen  (ADVIL ) 600 MG tablet   HYDROcodone -acetaminophen  (NORCO/VICODIN) 5-325 MG tablet   Vitamin D  deficiency   Other Visit Diagnoses       Encounter for screening mammogram for malignant neoplasm of breast       Relevant Orders   MM 3D SCREENING MAMMOGRAM BILATERAL BREAST     S/P thyroidectomy       Relevant Orders   TSH+T4F+T3Free (Completed)     Postoperative hypothyroidism       Relevant Medications   levothyroxine (SYNTHROID) 150 MCG tablet   Other Relevant Orders   TSH+T4F+T3Free (Completed)     Encounter for monitoring chronic NSAID therapy       Relevant Orders   CBC (Completed)       Assessment and Plan Assessment & Plan Papillary thyroid  carcinoma, status post thyroidectomy Status post total thyroidectomy on November 3rd for papillary thyroid  carcinoma with lymphatic invasion. No lymph node involvement. Referral to oncologist/endocrinologist at [facility] for further management, likely involving radioactive iodine therapy. Reports improved breathing and swallowing post-surgery. - Continue follow-up with oncologist/endocrinologist at [facility] for further management.  Postoperative hypothyroidism Chronic  Managed with Synthroid 150 mcg daily. Thyroid   levels to be checked due to recent surgery and upcoming follow-up with specialist. - Checked TSH, T4, and T3 levels. - Continue Synthroid 150 mcg daily.  Chronic pain syndrome with insomnia Chronic pain managed with hydrocodone -acetaminophen  and meloxicam . Reports  meloxicam  may not be as effective as before. Insomnia managed with hydroxyzine , but prefers previous formulation. Discussed potential NSAID regimen adjustment to improve pain control and minimize GI side effects. - Prescribed Total Leg 500 mg once daily. - Prescribed ibuprofen  600 mg every 8 hours on days not taking Total Leg. - Prescribed hydroxyzine  10 mg tablets for insomnia. - Prescribed Norco for pain management. - Checked kidney function, liver function, CMP, and CBC to monitor NSAID effects.  Anxiety disorder Chronic  Moderate anxiety managed with Xanax  0.25 mg twice daily as needed. Reports situational anxiety related to upcoming medical appointments. - Prescribed Ativan  for situational anxiety before medical appointments.  Discoid lupus erythematosus Chronic  Reports no worsening of symptoms despite not taking Plaquenil for 15 years.  Multiple sclerosis Chronic  Well-managed with no reported worsening of symptoms. Reports chronic pain but attributes it to other causes.  Obesity, class III Chronic  Class III obesity acknowledged. No specific management plan discussed.  Central sleep apnea Chronic  No specific management plan discussed. Sleep Is improved since thyroid  removal  Continue lifestyle management   Vitamin D  deficiency Chronic  Reports stopping supplements prior to surgery. - Checked vitamin D  levels.  Chronic NSAID therapy monitoring Chronic NSAID therapy with meloxicam  and ibuprofen . Discussed potential GI side effects and need for monitoring. - Checked kidney function, liver function, CMP, and CBC to monitor NSAID effects.  General health maintenance Breast cancer screening ordered. - Ordered mammogram for breast cancer screening.     Return in about 3 months (around 01/28/2025) for Chronic F/U, Thyroid .         Rockie Agent, MD  [facility] [phone] (phone) [phone] (fax)  [facility]     [1]  Outpatient Medications Prior to Visit  Medication Sig   levothyroxine (SYNTHROID) 150 MCG tablet Take by mouth.   meloxicam  (MOBIC ) 15 MG tablet Take 1 tablet (15 mg total) by mouth daily at 6 (six) AM.   [DISCONTINUED] ALPRAZolam  (XANAX ) 0.25 MG tablet Take 1 tablet (0.25 mg total) by mouth 2 (two) times daily as needed for anxiety.   [DISCONTINUED] HYDROcodone -acetaminophen  (NORCO/VICODIN) 5-325 MG tablet Take 1-2 tablets by mouth every 6 (six) hours as needed for moderate pain (pain score 4-6). Take 1-2 times daily as needed   [DISCONTINUED] hydrOXYzine  (ATARAX ) 25 MG tablet TAKE 0.5-1 TABLETS (12.5-25 MG TOTAL) BY MOUTH DAILY AS NEEDED.   No facility-administered medications prior to visit.

## 2024-10-31 LAB — TSH+T4F+T3FREE
Free T4: 1.48 ng/dL (ref 0.82–1.77)
T3, Free: 2.8 pg/mL (ref 2.0–4.4)
TSH: 4.11 u[IU]/mL (ref 0.450–4.500)

## 2024-10-31 LAB — CMP14+EGFR
ALT: 23 IU/L (ref 0–32)
AST: 13 IU/L (ref 0–40)
Albumin: 4.3 g/dL (ref 3.8–4.9)
Alkaline Phosphatase: 108 IU/L (ref 49–135)
BUN/Creatinine Ratio: 20 (ref 9–23)
BUN: 12 mg/dL (ref 6–24)
Bilirubin Total: 0.3 mg/dL (ref 0.0–1.2)
CO2: 23 mmol/L (ref 20–29)
Calcium: 9.6 mg/dL (ref 8.7–10.2)
Chloride: 103 mmol/L (ref 96–106)
Creatinine, Ser: 0.61 mg/dL (ref 0.57–1.00)
Globulin, Total: 2.5 g/dL (ref 1.5–4.5)
Glucose: 100 mg/dL — ABNORMAL HIGH (ref 70–99)
Potassium: 4.8 mmol/L (ref 3.5–5.2)
Sodium: 143 mmol/L (ref 134–144)
Total Protein: 6.8 g/dL (ref 6.0–8.5)
eGFR: 103 mL/min/1.73 (ref >59)

## 2024-10-31 LAB — CBC
Hematocrit: 41.4 % (ref 34.0–46.6)
Hemoglobin: 13.4 g/dL (ref 11.1–15.9)
MCH: 26.5 pg — ABNORMAL LOW (ref 26.6–33.0)
MCHC: 32.4 g/dL (ref 31.5–35.7)
MCV: 82 fL (ref 79–97)
Platelets: 333 x10E3/uL (ref 150–450)
RBC: 5.06 x10E6/uL (ref 3.77–5.28)
RDW: 14.4 % (ref 11.7–15.4)
WBC: 7.8 x10E3/uL (ref 3.4–10.8)

## 2024-11-03 ENCOUNTER — Telehealth: Payer: Self-pay

## 2024-11-03 ENCOUNTER — Other Ambulatory Visit (HOSPITAL_COMMUNITY): Payer: Self-pay

## 2024-11-03 NOTE — Telephone Encounter (Signed)
 Pharmacy Patient Advocate Encounter   Received notification from Onbase that prior authorization for Etodolac  ER 500MG  er tablets is required/requested.   Insurance verification completed.   The patient is insured through [facility] MEDICAID.   Per test claim: PA required; PA submitted to above mentioned insurance via Latent Key/confirmation #/[id number] [facility] Status is pending

## 2024-11-04 ENCOUNTER — Ambulatory Visit: Payer: Self-pay | Admitting: Family Medicine

## 2024-11-04 NOTE — Telephone Encounter (Signed)
 Pharmacy Patient Advocate Encounter  Received notification from [facility] MEDICAID that Prior Authorization for Etodolac  ER 500MG  er tablets has been APPROVED  to 12.16.26. SABRA This test claim was processed through [facility]- copay amounts may vary at other pharmacies due to pharmacy/plan contracts, or as the patient moves through the different stages of their insurance plan.   PA #/Case ID/Reference #: [id number]

## 2024-11-05 ENCOUNTER — Other Ambulatory Visit (HOSPITAL_COMMUNITY): Payer: Self-pay

## 2024-11-07 ENCOUNTER — Other Ambulatory Visit: Payer: Self-pay | Admitting: Family Medicine

## 2024-11-07 DIAGNOSIS — G4701 Insomnia due to medical condition: Secondary | ICD-10-CM

## 2024-11-07 DIAGNOSIS — G8929 Other chronic pain: Secondary | ICD-10-CM

## 2025-02-01 ENCOUNTER — Ambulatory Visit: Admitting: Family Medicine

## 2026-03-07 ENCOUNTER — Ambulatory Visit: Admitting: Dermatology
